# Patient Record
Sex: Male | Born: 1965 | Race: Black or African American | Hispanic: No | Marital: Married | Smoking: Never smoker
Health system: Southern US, Community
[De-identification: ages and names within clinical notes are randomized; demographics above are authoritative.]

## PROBLEM LIST (undated history)

## (undated) ENCOUNTER — Ambulatory Visit: Discharge status: Absent without leave | Payer: BC Managed Care – PPO

## (undated) DIAGNOSIS — I10 Essential (primary) hypertension: Secondary | ICD-10-CM

## (undated) DIAGNOSIS — M7541 Impingement syndrome of right shoulder: Secondary | ICD-10-CM

## (undated) DIAGNOSIS — J309 Allergic rhinitis, unspecified: Secondary | ICD-10-CM

## (undated) DIAGNOSIS — M1990 Unspecified osteoarthritis, unspecified site: Secondary | ICD-10-CM

## (undated) DIAGNOSIS — M25811 Other specified joint disorders, right shoulder: Secondary | ICD-10-CM

## (undated) DIAGNOSIS — Z9289 Personal history of other medical treatment: Secondary | ICD-10-CM

## (undated) DIAGNOSIS — R7301 Impaired fasting glucose: Secondary | ICD-10-CM

## (undated) DIAGNOSIS — G4733 Obstructive sleep apnea (adult) (pediatric): Secondary | ICD-10-CM

## (undated) DIAGNOSIS — E669 Obesity, unspecified: Secondary | ICD-10-CM

## (undated) DIAGNOSIS — Z973 Presence of spectacles and contact lenses: Secondary | ICD-10-CM

## (undated) DIAGNOSIS — E119 Type 2 diabetes mellitus without complications: Secondary | ICD-10-CM

## (undated) DIAGNOSIS — Z9989 Dependence on other enabling machines and devices: Secondary | ICD-10-CM

## (undated) DIAGNOSIS — G4730 Sleep apnea, unspecified: Secondary | ICD-10-CM

## (undated) HISTORY — DX: Obesity, unspecified: E66.9

## (undated) HISTORY — DX: Allergic rhinitis, unspecified: J30.9

## (undated) HISTORY — DX: Essential (primary) hypertension: I10

## (undated) HISTORY — DX: Impaired fasting glucose: R73.01

## (undated) HISTORY — PX: LAPAROSCOPIC CHOLECYSTECTOMY: SUR755

## (undated) HISTORY — PX: KNEE SURGERY: SHX244

## (undated) HISTORY — PX: CHOLECYSTECTOMY: SHX55

## (undated) HISTORY — DX: Sleep apnea, unspecified: G47.30

---

## 2001-10-09 ENCOUNTER — Ambulatory Visit (HOSPITAL_COMMUNITY): Admission: RE | Admit: 2001-10-09 | Discharge: 2001-10-09 | Payer: Self-pay | Admitting: Pulmonary Disease

## 2001-12-11 ENCOUNTER — Emergency Department (HOSPITAL_COMMUNITY): Admission: EM | Admit: 2001-12-11 | Discharge: 2001-12-11 | Payer: Self-pay | Admitting: Emergency Medicine

## 2002-12-14 ENCOUNTER — Encounter: Payer: Self-pay | Admitting: Orthopedic Surgery

## 2002-12-14 ENCOUNTER — Ambulatory Visit: Admission: RE | Admit: 2002-12-14 | Discharge: 2002-12-14 | Payer: Self-pay | Admitting: Orthopedic Surgery

## 2003-01-30 ENCOUNTER — Encounter (HOSPITAL_COMMUNITY): Admission: RE | Admit: 2003-01-30 | Discharge: 2003-03-01 | Payer: Self-pay

## 2003-03-04 ENCOUNTER — Ambulatory Visit (HOSPITAL_COMMUNITY): Admission: RE | Admit: 2003-03-04 | Discharge: 2003-03-04 | Payer: Self-pay | Admitting: Orthopedic Surgery

## 2003-03-05 ENCOUNTER — Encounter (HOSPITAL_COMMUNITY): Admission: RE | Admit: 2003-03-05 | Discharge: 2003-04-04 | Payer: Self-pay | Admitting: Internal Medicine

## 2003-03-12 ENCOUNTER — Encounter (HOSPITAL_COMMUNITY): Admission: RE | Admit: 2003-03-12 | Discharge: 2003-04-11 | Payer: Self-pay | Admitting: Orthopedic Surgery

## 2004-05-24 ENCOUNTER — Ambulatory Visit: Payer: Self-pay | Admitting: Pulmonary Disease

## 2004-05-24 ENCOUNTER — Ambulatory Visit: Admission: RE | Admit: 2004-05-24 | Discharge: 2004-05-24 | Payer: Self-pay | Admitting: Pulmonary Disease

## 2004-11-23 ENCOUNTER — Ambulatory Visit: Payer: Self-pay | Admitting: Orthopedic Surgery

## 2004-12-03 ENCOUNTER — Ambulatory Visit: Payer: Self-pay | Admitting: Orthopedic Surgery

## 2004-12-09 ENCOUNTER — Ambulatory Visit: Payer: Self-pay | Admitting: Orthopedic Surgery

## 2004-12-16 ENCOUNTER — Ambulatory Visit: Payer: Self-pay | Admitting: Orthopedic Surgery

## 2007-03-13 ENCOUNTER — Ambulatory Visit: Payer: Self-pay | Admitting: Orthopedic Surgery

## 2007-03-13 DIAGNOSIS — M25569 Pain in unspecified knee: Secondary | ICD-10-CM | POA: Insufficient documentation

## 2007-03-13 DIAGNOSIS — M1710 Unilateral primary osteoarthritis, unspecified knee: Secondary | ICD-10-CM

## 2007-03-13 DIAGNOSIS — IMO0002 Reserved for concepts with insufficient information to code with codable children: Secondary | ICD-10-CM | POA: Insufficient documentation

## 2007-04-06 ENCOUNTER — Ambulatory Visit: Payer: Self-pay | Admitting: Orthopedic Surgery

## 2007-04-06 DIAGNOSIS — M25529 Pain in unspecified elbow: Secondary | ICD-10-CM | POA: Insufficient documentation

## 2007-04-13 ENCOUNTER — Ambulatory Visit: Payer: Self-pay | Admitting: Orthopedic Surgery

## 2007-04-20 ENCOUNTER — Ambulatory Visit: Payer: Self-pay | Admitting: Orthopedic Surgery

## 2007-09-04 ENCOUNTER — Ambulatory Visit: Payer: Self-pay | Admitting: Orthopedic Surgery

## 2007-09-04 DIAGNOSIS — M23302 Other meniscus derangements, unspecified lateral meniscus, unspecified knee: Secondary | ICD-10-CM | POA: Insufficient documentation

## 2007-09-08 ENCOUNTER — Ambulatory Visit: Payer: Self-pay | Admitting: Orthopedic Surgery

## 2007-09-08 ENCOUNTER — Ambulatory Visit (HOSPITAL_COMMUNITY): Admission: RE | Admit: 2007-09-08 | Discharge: 2007-09-08 | Payer: Self-pay | Admitting: Orthopedic Surgery

## 2007-09-12 ENCOUNTER — Ambulatory Visit: Payer: Self-pay | Admitting: Orthopedic Surgery

## 2007-09-15 ENCOUNTER — Ambulatory Visit (HOSPITAL_COMMUNITY): Admission: RE | Admit: 2007-09-15 | Discharge: 2007-09-15 | Payer: Self-pay | Admitting: Family Medicine

## 2007-09-19 ENCOUNTER — Encounter (HOSPITAL_COMMUNITY): Admission: RE | Admit: 2007-09-19 | Discharge: 2007-10-19 | Payer: Self-pay | Admitting: Orthopedic Surgery

## 2007-09-19 ENCOUNTER — Encounter: Payer: Self-pay | Admitting: Orthopedic Surgery

## 2007-09-20 ENCOUNTER — Encounter (INDEPENDENT_AMBULATORY_CARE_PROVIDER_SITE_OTHER): Payer: Self-pay | Admitting: *Deleted

## 2007-09-20 ENCOUNTER — Ambulatory Visit (HOSPITAL_COMMUNITY): Admission: RE | Admit: 2007-09-20 | Discharge: 2007-09-20 | Payer: Self-pay | Admitting: *Deleted

## 2007-10-10 ENCOUNTER — Encounter: Payer: Self-pay | Admitting: Orthopedic Surgery

## 2007-10-11 ENCOUNTER — Ambulatory Visit: Payer: Self-pay | Admitting: Orthopedic Surgery

## 2007-10-23 ENCOUNTER — Ambulatory Visit: Payer: Self-pay | Admitting: Orthopedic Surgery

## 2007-10-27 ENCOUNTER — Encounter: Payer: Self-pay | Admitting: Orthopedic Surgery

## 2007-11-15 ENCOUNTER — Ambulatory Visit: Payer: Self-pay | Admitting: Orthopedic Surgery

## 2007-11-23 ENCOUNTER — Telehealth: Payer: Self-pay | Admitting: Orthopedic Surgery

## 2007-12-14 ENCOUNTER — Encounter: Payer: Self-pay | Admitting: Orthopedic Surgery

## 2008-05-20 ENCOUNTER — Ambulatory Visit: Payer: Self-pay | Admitting: Orthopedic Surgery

## 2008-06-20 ENCOUNTER — Encounter: Payer: Self-pay | Admitting: Orthopedic Surgery

## 2008-07-22 ENCOUNTER — Telehealth: Payer: Self-pay | Admitting: Orthopedic Surgery

## 2008-07-25 ENCOUNTER — Ambulatory Visit: Payer: Self-pay | Admitting: Orthopedic Surgery

## 2008-08-01 ENCOUNTER — Ambulatory Visit: Payer: Self-pay | Admitting: Orthopedic Surgery

## 2008-08-07 ENCOUNTER — Ambulatory Visit: Payer: Self-pay | Admitting: Orthopedic Surgery

## 2009-05-01 ENCOUNTER — Ambulatory Visit: Payer: Self-pay | Admitting: Orthopedic Surgery

## 2009-05-01 DIAGNOSIS — M549 Dorsalgia, unspecified: Secondary | ICD-10-CM | POA: Insufficient documentation

## 2009-05-14 ENCOUNTER — Telehealth: Payer: Self-pay | Admitting: Orthopedic Surgery

## 2009-05-14 ENCOUNTER — Ambulatory Visit: Payer: Self-pay | Admitting: Orthopedic Surgery

## 2009-05-15 ENCOUNTER — Encounter (HOSPITAL_COMMUNITY): Admission: RE | Admit: 2009-05-15 | Discharge: 2009-06-14 | Payer: Self-pay | Admitting: Orthopedic Surgery

## 2009-05-15 ENCOUNTER — Encounter: Payer: Self-pay | Admitting: Orthopedic Surgery

## 2009-06-17 ENCOUNTER — Encounter (HOSPITAL_COMMUNITY): Admission: RE | Admit: 2009-06-17 | Discharge: 2009-07-17 | Payer: Self-pay | Admitting: Orthopedic Surgery

## 2009-06-18 ENCOUNTER — Encounter: Payer: Self-pay | Admitting: Orthopedic Surgery

## 2009-06-25 ENCOUNTER — Ambulatory Visit: Payer: Self-pay | Admitting: Orthopedic Surgery

## 2009-07-04 ENCOUNTER — Encounter: Payer: Self-pay | Admitting: Orthopedic Surgery

## 2009-07-30 ENCOUNTER — Encounter: Payer: Self-pay | Admitting: Orthopedic Surgery

## 2010-04-08 ENCOUNTER — Ambulatory Visit: Payer: Self-pay | Admitting: Orthopedic Surgery

## 2010-06-04 NOTE — Assessment & Plan Note (Signed)
Summary: 2 WK RE-CK LT KNEE/BCBS/CAF   Visit Type:  Follow-up  CC:  back pain .  History of Present Illness: I saw Adam Hahn in the office today for a followup visit.  He is a 45 years old man with the complaint of:  back pain   DX: Left knee OA Low back pain.  Treatment: Synvisc x 3 over 3 different time periods.  MEDS:  Celebrex 200mg  as needed helps./ muscle relaxer using ice and heat   he complains of pain in the lower back and stiffness in his lower back with tightness in his hamstrings. As far as his LEFT knee goes, he has good and bad days, but no swelling today. He denies any numbness in his feet or legs, and no catching or locking has been noted in the back.  For the last 2, weeks. He's been stretching his back and taking Celebrex  physical exam of the LEFT knee shows that there is no swelling. Has full range of motion for him. His knee is stable. His strength is normal. Stability and alignment are normal.  Neurovascular exam is intact.  He does have some tenderness on each side of his back in the lower and middle lumbar segments.  Impression back pain without radicular symptoms.  Recommend physical therapy continue stretching. Okay to see the chiropractor or get a massage. Continue Celebrex. Return in about a month    For the last 2, weeks. He's been stretching Current Medications (verified): 1)  Celebrex 200 Mg  Caps (Celecoxib) .Marland Kitchen.. 1 By Mouth Two Times A Day  Allergies (verified): No Known Drug Allergies   Impression & Recommendations:  Problem # 1:  BACK PAIN (ICD-724.5) Assessment Unchanged  His updated medication list for this problem includes:    Celebrex 200 Mg Caps (Celecoxib) .Marland Kitchen... 1 by mouth two times a day  Orders: Est. Patient Level III (56387)  Patient Instructions: 1)  physical therapy:   2)  BACK PAIN  3)  return in 6 weeks  4)  Chiropractor is ok 5)  [Dr Dabbs in [place] or [facility] ask for Kat]

## 2010-06-04 NOTE — Miscellaneous (Signed)
Summary: PT order  PT order   Imported By: Cammie Sickle 07/18/2009 16:33:00  _____________________________________________________________________  External Attachment:    Type:   Image     Comment:   External Document

## 2010-06-04 NOTE — Medication Information (Signed)
Summary: Tax adviser   Imported By: Elvera Maria 08/01/2009 10:13:16  _____________________________________________________________________  External Attachment:    Type:   Image     Comment:   med necessity

## 2010-06-04 NOTE — Miscellaneous (Signed)
Summary: PT clinical evaluation  PT clinical evaluation   Imported By: Jacklynn Ganong 05/27/2009 10:57:23  _____________________________________________________________________  External Attachment:    Type:   Image     Comment:   External Document

## 2010-06-04 NOTE — Miscellaneous (Signed)
Summary: Rehab Report  Rehab Report   Imported By: Elvera Maria 06/24/2009 11:16:02  _____________________________________________________________________  External Attachment:    Type:   Image     Comment:   progress note pt

## 2010-06-04 NOTE — Progress Notes (Signed)
Summary: tightness in hamstrings  Phone Note Call from Patient   Summary of Call: Adam Hahn (07/08/2065) called back, said to tell you he is also having tightness in his hamstrings. His # [phone] Initial call taken by: Jacklynn Ganong,  May 14, 2009 2:12 PM

## 2010-06-04 NOTE — Miscellaneous (Signed)
Summary: PT progress notes  PT progress notes   Imported By: Jacklynn Ganong 07/08/2009 13:14:10  _____________________________________________________________________  External Attachment:    Type:   Image     Comment:   External Document

## 2010-06-04 NOTE — Letter (Signed)
Summary: Generic Letter  [facility]  [address] [place] [address]  [place], [place] [address]   Phone: [phone]  Fax: [phone]         06/25/2009   Adam Hahn [address] [place], [place]  [address]  Dear Mr. [name], Shomaker To Whom This May Concern:  Having a history of treatment with Mr. [name], and having reviewed his medical records to include service medical records, it is my opintion that "it is as least likely as not, that his current back condition stems from his knee condition based on altered gait, overprotection, and over compensation of that body part."   Sincerely,    Terrance Mass, MD

## 2010-06-04 NOTE — Assessment & Plan Note (Signed)
Summary: 6 wk RE-CK BACK,FOL'G PT/BCBS/CAF   Visit Type:  Follow-up  CC:  back pain fu.  History of Present Illness: I saw Adam Hahn in the office today for a followup visit.  He is a 45 years old man with the complaint of:  Low back pain.  Today is 6 week recheck after therapy.  Back is doing better.  ros; normal bodily functions bowel and bladder   Still taking Celebrex as needed.      Allergies: No Known Drug Allergies  Physical Exam  Additional Exam:  normal exam except for a small amount of midline loer back tenderness   Impression & Recommendations:  Problem # 1:  BACK PAIN (ICD-724.5) Assessment Improved  His updated medication list for this problem includes:    Celebrex 200 Mg Caps (Celecoxib) .Marland Kitchen... 1 by mouth two times a day    Robaxin 500 Mg Tabs (Methocarbamol) .Marland Kitchen... 1 by mouth q 6 as needed  Orders: Est. Patient Level III (86578)  Medications Added to Medication List This Visit: 1)  Robaxin 500 Mg Tabs (Methocarbamol) .Marland Kitchen.. 1 by mouth q 6 as needed  Patient Instructions: 1)  Finish treatments  2)  take robaxin as needed  3)  f/u as needed knee or back  Prescriptions: ROBAXIN 500 MG TABS (METHOCARBAMOL) 1 by mouth q 6 as needed  #30 x 2   Entered and Authorized by:   Fuller Canada MD   Signed by:   Fuller Canada MD on 06/25/2009   Method used:   Print then Give to Patient   RxID:   [id number]

## 2010-09-15 NOTE — Op Note (Signed)
NAME:  Adam Hahn, Adam Hahn NO.:  [id number]   MEDICAL RECORD NO.:  [id number]          PATIENT TYPE:  AMB   LOCATION:  DAY                           FACILITY:  [facility]   PHYSICIAN:  Vickki Hearing, M.D.DATE OF BIRTH:  Mar 21, 1966   DATE OF PROCEDURE:  09/08/2007  DATE OF DISCHARGE:                               OPERATIVE REPORT   HISTORY:  Adam Hahn is a 45 year old male status post 4 left knee  arthroscopies with no major medical problems who presents with a feeling  of a loose body in his knee.  He was treated with Synvisc injections,  rest, ice, Tylenol Arthritis, activity modification, but continues to  have a grinding sensation under his knee cap on the lateral side with  feeling that there is something floating or loose in the knee joint.  Over the last 2 weeks, he had more pain and swelling and presents for  arthroscopic evaluation of the left knee.   PREOPERATIVE DIAGNOSIS:  Left knee pain with loose body.   POSTOPERATIVE DIAGNOSIS:  Arthritis left knee.   PROCEDURES:  1. Arthroscopy left knee.  2. Debridement  lateral meniscus, lateral femoral condyle, and lateral      tibial plateau.  3. Microfracture of the superolateral trochlea.   SURGEON:  Fuller Canada, MD.   ASSISTANTS:  There were no assistants.   ANESTHESIA:  General LMA.   FINDINGS:  1. There was normal medial compartment.  2. Attenuated but intact ACL.  3. Fraying and tearing of the free edge of the lateral meniscus.  4. Grade 3 changes of the lateral femoral condyle.  5. Grade 2 changes of the lateral tibial plateau, grade 4 lesion in      the superolateral trochlea which was the area of the patient's      pain.  6. Grade 3 lesions lateral facet of the patella.   SPECIMENS:  No specimens.   BLOOD LOSS:  Minimal.   COMPLICATIONS:  None.   COUNTS:  Reported as correct.   The patient taken to the PACU in good condition.   DETAILS OF PROCEDURE:  Adam Hahn was identified in the  preop area.  His  left knee was marked for surgery and then countersigned.  He was given  his preoperative antibiotic and taken to surgery suite where he had  general anesthetic with LMA.  Time-out procedure was completed after  prep and drape.   Portal sites were injected.  The lateral portal was established first.  A scope was introduced through the lateral compartment.  Diagnostic  arthroscopy revealed the findings as stated.  We addressed the lateral  meniscus first.  We debrided the free edge tearing.  We debrided the  tibial plateau with a straight shaver, lateral femoral condyle was  debrided with chondroplasty as well.   We then went into the suprapatellar pouch where was a grade 4 lesion of  the superolateral trochlea with grade 3 lesion of the lateral facet of  the patella.  After debriding this region, we did three microfractures  into this area to stimulate healing.   The knee was then irrigated  and 15 mL of Marcaine was injected into it.  We closed with Steri-Strips, applied a sterile dressing and a cryo cuff.   POSTOP PLAN:  The trochlear microfracture will not affect his  weightbearing status as it is not in a weightbearing region of the  femoral condyle.  He can start immediate range of motion, weightbearing,  and progress as normal.  The only limitation should be stair climbing  other than routine.      Vickki Hearing, M.D.  Electronically Signed     SEH/MEDQ  D:  09/08/2007  T:  09/09/2007  Job:  [id number]

## 2010-09-15 NOTE — Op Note (Signed)
NAME:  MARTINO, TOMPSON NO.:  [id number]   MEDICAL RECORD NO.:  [id number]          PATIENT TYPE:  REC   LOCATION:  REH                           FACILITY:  [facility]   PHYSICIAN:  Alfonse Ras, MD   DATE OF BIRTH:  [birth year]   DATE OF PROCEDURE:  DATE OF DISCHARGE:                               OPERATIVE REPORT   PREOPERATIVE DIAGNOSIS:  Symptomatic cholelithiasis.   POSTOPERATIVE DIAGNOSES:  1. Symptomatic cholelithiasis.  2. Acute cholecystitis.   PROCEDURE:  Laparoscopic cholecystectomy with intraoperative  cholangiogram.   SURGEON:  Alfonse Ras, MD   ASSISTANT:  Amber L. Freida Busman, MD   ANESTHESIA:  General.   DESCRIPTION:  The patient was taken to the operating room, placed in a  supine position.  After adequate general anesthesia was induced using  endotracheal tube, the abdomen was prepped and draped in a normal  sterile fashion.  Using a transverse infraumbilical incision, I  dissected down to the fascia.  This was opened vertically.  Peritoneum  was entered and 0 Vicryl pursestring suture was placed on the fascial  defect.  Hasson trocar was placed in the abdomen and pneumoperitoneum  was obtained.  Under direct vision, an 11 mm trocar was placed in the  subxiphoid region, two 5 mm ports were placed in the right abdomen and  gallbladder was identified and retracted cephalad.  There was a number  of dense and filmy adhesions to the entire gallbladder which were  tediously taken down until the infundibulum could easily be identified  and grasped laterally.  Dissection revealed a critical view of the  cystic duct which was clipped up on the gallbladder.  Ductotomy was made  and Reddick cholangiocatheter was introduced through the ductotomy.  Cholangiogram was performed which showed normal filling of the common  bile duct, right and left hepatic ducts and free flow into the duodenum  with no filling defects.  Cystic duct was then triply clipped  and  divided.  Cystic artery was dissected in a similar fashion, triply  clipped and divided.  The gallbladder was taken off the gallbladder bed  using Bovie electrocautery and placed in an Endocatch bag and removed  through the umbilical port.  The right upper quadrant was copiously  irrigated.  Adequate hemostasis was ensured.  Pneumoperitoneum was  released.  The infraumbilical fascial defect was closed with a 0 Vicryl  pursestring suture.  Skin incisions were closed with subcuticular 4-0  Monocryl.  Steri-Strips and sterile dressings were applied.  The patient  tolerated the procedure well and went to PACU in good condition.      Alfonse Ras, MD  Electronically Signed    KRE/MEDQ  D:  09/20/2007  T:  09/21/2007  Job:  [id number]

## 2010-09-15 NOTE — H&P (Signed)
NAME:  SAUD, BAIL NO.:  [id number]   MEDICAL RECORD NO.:  [id number]          PATIENT TYPE:  AMB   LOCATION:  DAY                           FACILITY:  [facility]   PHYSICIAN:  Vickki Hearing, M.D.DATE OF BIRTH:  22-May-1965   DATE OF ADMISSION:  DATE OF DISCHARGE:  LH                              HISTORY & PHYSICAL   CHIEF COMPLAINT:  Left knee pain.   HISTORY:  Adam Hahn is a 45 year old male status post 4 left knee  arthroscopies with no major medical problems, no allergies, no medicines  of any significance who presents with continued knee pain and feeling of  loose body in the knee and grinding sensation in the kneecap after  treatment with Synvisc injections for the second time, use of ice,  Tylenol arthritis, and activity modification.  Approximately November  2008, he started having symptoms again, had his Synvisc injections  repeated and completed on April 20, 2007, did well until  approximately a week or so ago, when he started having more pain and  swelling.   Past family, and social history, and review of systems as stated.   ALLERGIES:  No known drug allergies.   PHYSICAL EXAMINATION:  GENERAL:  This is a well-developed, well-  nourished large male with no deformities.  Normal grooming.  CARDIOVASCULAR:  Normal pulse and perfusion with good color.  EXTREMITIES:  No tenderness or swelling in the limbs.  LYMPH:  Lymph nodes are benign.  PSYCH:  He is awake, alert, and oriented x3.  NEUROLOGIC:  Normal coordination, reflexes, and sensation.  SKIN:  Intact with no rashes.  MUSCULOSKELETAL:  Normal evaluation of the upper extremities with no  clubbing, cyanosis, edema, contracture, subluxation, atrophy, or tremor.  Right knee examination is benign today.  Left knee exam reveals the  following; there is no swelling.  There is some discomfort to palpation  especially on the lateral side at the patellofemoral articulation and on  the lateral  femoral condyle.  He has a mild effusion, flexion to 120  degrees, and extension is full.   Radiographs were repeated.  I did not see any loose bodies.   IMPRESSION:  Impression, however, is loose body.   PLAN:  Plan is for arthroscopy of the left knee removal of the loose  body.  Debridement of degenerative articular cartilage.   The patient does not want to have cartilage transplant, which was  discussed.  He does not want microfracture, which was also discussed.      Vickki Hearing, M.D.  Electronically Signed     SEH/MEDQ  D:  09/07/2007  T:  09/08/2007  Job:  [id number]   cc:   [facility]  Fax: [phone]

## 2010-09-18 NOTE — Procedures (Signed)
[facility]  Patient:    NETHAN, CAUDILLO Visit Number: [id number] MRN: [id number]          Service Type: Attending:  Kari Baars, M.D. Dictated by:   Kari Baars, M.D. Proc. Date: 10/09/01                                Stress Test  Mr. Wetherington is undergoing graded exercise testing because of a history of chest discomfort.  He has no contraindications to graded exercise testing.  Mr. Keesling exercised for seven minutes and 27 seconds on the Bruce protocol reaching and sustaining 10.1 METS.  His maximum recorded heart rate was 174 which is 93% of his age predicted maximal heart rate.  Blood pressure response to exercise was normal.  He stopped exercise due to fatigue and shortness of breath, but had no chest pain.  There were no electrocardiographic changes suggestive of inducible ischemia.  IMPRESSION: 1. Good exercise tolerance. 2. No evidence of inducible ischemia. 3. Normal blood pressure response to exercise. 4. Shortness of breath and fatigue, but no other symptoms with exercise. Dictated by:   Kari Baars, M.D. Attending:  Kari Baars, M.D. DD:  10/09/01 TD:  10/10/01 Job: [id number]

## 2010-09-18 NOTE — Procedures (Signed)
NAME:  Adam Hahn, Adam Hahn NO.:  [id number]   MEDICAL RECORD NO.:  [id number]          PATIENT TYPE:  OUT   LOCATION:  SLEEP LAB                     FACILITY:  [facility]   PHYSICIAN:  Marcelyn Bruins, M.D. [facility] DATE OF BIRTH:  30-Jul-1965   DATE OF STUDY:  05/24/2004                              NOCTURNAL POLYSOMNOGRAM   REFERRING PHYSICIAN:  Kari Baars, MD   INDICATION FOR STUDY:  Hypersomnia with sleep apnea.   SLEEP ARCHITECTURE:  The patient had a total sleep time of 425 minutes with  adequate REM and decreased slow wave sleep. Sleep onset was normal as was  REM onset.   IMPRESSION:  1.  Split-night study reveals severe obstructive sleep apnea/hypopnea      syndrome with 125 obstructive events noted in the first 169 minutes of      sleep. This gave the patient a respiratory disturbance index of 44      events per hour and O2 desaturation as low as 84%. Events were not      positional. There was moderate to loud snoring noted during the first      half of the study. By protocol, the patient was placed on a medium      Aclaim nasal mask and C-PAP titration was initiated. At a level of 6,      there was control of snoring and moderate control of the patient's      obstructive sleep apnea. However, there was some degree of breakthrough      during the final REM periods. I would recommend at this time a final C-      PAP pressure of 8, especially given the patient's severity of sleep      apnea.  2.  Moderate to loud snoring noted throughout the study.  3.  No clinically significant cardiac arrhythmia.      KC/MEDQ  D:  06/05/2004 16:29:47  T:  06/06/2004 12:06:53  Job:  [id number]

## 2011-01-27 LAB — CBC
HCT: 48.3
Hemoglobin: 16.4
MCHC: 34
MCV: 86
Platelets: 294
RBC: 5.61
RDW: 13
WBC: 8.3

## 2011-01-27 LAB — COMPREHENSIVE METABOLIC PANEL
ALT: 38
AST: 31
Albumin: 4
Alkaline Phosphatase: 47
BUN: 13
CO2: 28
Calcium: 9.9
Chloride: 101
Creatinine, Ser: 1.2
GFR calc Af Amer: >60
GFR calc non Af Amer: >60
Glucose, Bld: 99
Potassium: 5
Sodium: 138
Total Bilirubin: 0.8
Total Protein: 7

## 2011-01-27 LAB — DIFFERENTIAL
Basophils Absolute: 0.1
Basophils Relative: 1
Eosinophils Absolute: 0.9 — ABNORMAL HIGH
Eosinophils Relative: 11 — ABNORMAL HIGH
Lymphocytes Relative: 36
Lymphs Abs: 3
Monocytes Absolute: 0.5
Monocytes Relative: 7
Neutro Abs: 3.7
Neutrophils Relative %: 45

## 2011-06-03 ENCOUNTER — Ambulatory Visit: Payer: Self-pay | Admitting: Orthopedic Surgery

## 2011-06-03 ENCOUNTER — Ambulatory Visit (INDEPENDENT_AMBULATORY_CARE_PROVIDER_SITE_OTHER): Payer: BC Managed Care – PPO | Admitting: Orthopedic Surgery

## 2011-06-03 ENCOUNTER — Encounter: Payer: Self-pay | Admitting: Orthopedic Surgery

## 2011-06-03 VITALS — Ht 73.0 in | Wt 312.0 lb

## 2011-06-03 DIAGNOSIS — Z9889 Other specified postprocedural states: Secondary | ICD-10-CM | POA: Insufficient documentation

## 2011-06-03 DIAGNOSIS — IMO0002 Reserved for concepts with insufficient information to code with codable children: Secondary | ICD-10-CM

## 2011-06-03 DIAGNOSIS — M1712 Unilateral primary osteoarthritis, left knee: Secondary | ICD-10-CM

## 2011-06-03 DIAGNOSIS — M1710 Unilateral primary osteoarthritis, unspecified knee: Secondary | ICD-10-CM

## 2011-06-03 HISTORY — DX: Other specified postprocedural states: Z98.890

## 2011-06-03 NOTE — Patient Instructions (Signed)
Activity as tolerated

## 2011-06-03 NOTE — Progress Notes (Signed)
Patient ID: Adam Hahn, male   DOB: 1965/06/04, 46 y.o.   MRN: [id number]  LEFT knee.  Followup visit.  Status post arthroscopy, LEFT knee back in 2009.  Operative findings were as follows: Medial compartment was normal, and the ACL was attenuated, but intact, lateral meniscus was torn, and grade 3 changes of lateral femoral condyle, grade 2 changes lateral tibial plateau, grade 4 changes of the superolateral trochlea, grade 3 changes of the lateral patellofemoral facet.    Today's examination knee flexion 112. There is crepitance in the patellofemoral joint. There is also painful. Apprehension with lateral patellofemoral distress. There is mild laxity in anterior drawer test. Collateral ligaments are stable. Posterior cruciate ligament is stable. No joint effusion is seen. There is tenderness in the medial and  lateral compartments.  The [employer] would like a report regarding the status of the patient's knee.  According to the forms marked 4.71a-2 following report is rendered: (after speaking with Ms Dorris Carnes)  5003 , arthritis, degenerative (hypertrophic or osteoarthritis) rating is a 10 based on categories not listed as he has x-ray evidence of one major joint with incapacitating exacerbation. This meets criteria of "evidence of painful range of motion."  5256 knee, ankylosis of zero (ROM =0-110 measured by goniometer)  5257 knee other impairment of recurrent subluxation, or lateral instability, slight-10. (patellofemoral joint subluxation)  5258 cartilage semilunar dislocated with frequent episodes of locking pain and effusion into the joint:  0, ( lateral meniscus has been removed ).  5259 cartilage, semilunar, removal of, symptomatic: 10, secondary to lateral meniscal resection.  Please note it should be recognized that the patient has intermittent effusions. Has been in to the office for injections of cortisone and aspirations depending on his symptoms, which  are activity related. His symptoms are exacerbated by activity.  Diagnosis osteoarthritis, LEFT knee.

## 2011-06-23 ENCOUNTER — Ambulatory Visit: Payer: BC Managed Care – PPO | Admitting: Orthopedic Surgery

## 2012-09-08 ENCOUNTER — Encounter: Payer: Self-pay | Admitting: *Deleted

## 2012-09-11 ENCOUNTER — Encounter: Payer: Self-pay | Admitting: Family Medicine

## 2012-09-11 ENCOUNTER — Ambulatory Visit (INDEPENDENT_AMBULATORY_CARE_PROVIDER_SITE_OTHER): Payer: BC Managed Care – PPO | Admitting: Family Medicine

## 2012-09-11 VITALS — BP 128/80 | HR 70 | Ht 71.5 in | Wt 300.0 lb

## 2012-09-11 DIAGNOSIS — Z79899 Other long term (current) drug therapy: Secondary | ICD-10-CM

## 2012-09-11 DIAGNOSIS — G4733 Obstructive sleep apnea (adult) (pediatric): Secondary | ICD-10-CM

## 2012-09-11 DIAGNOSIS — R7301 Impaired fasting glucose: Secondary | ICD-10-CM | POA: Insufficient documentation

## 2012-09-11 DIAGNOSIS — Z125 Encounter for screening for malignant neoplasm of prostate: Secondary | ICD-10-CM

## 2012-09-11 DIAGNOSIS — I10 Essential (primary) hypertension: Secondary | ICD-10-CM | POA: Insufficient documentation

## 2012-09-11 LAB — LIPID PANEL
Cholesterol: 124 mg/dL (ref 0–200)
HDL: 42 mg/dL (ref >39)
LDL Cholesterol: 61 mg/dL (ref 0–99)
Total CHOL/HDL Ratio: 3 Ratio
Triglycerides: 107 mg/dL (ref <150)
VLDL: 21 mg/dL (ref 0–40)

## 2012-09-11 LAB — BASIC METABOLIC PANEL
BUN: 14 mg/dL (ref 6–23)
CO2: 27 mEq/L (ref 19–32)
Calcium: 9.7 mg/dL (ref 8.4–10.5)
Chloride: 102 mEq/L (ref 96–112)
Creat: 1.2 mg/dL (ref 0.50–1.35)
Glucose, Bld: 94 mg/dL (ref 70–99)
Potassium: 4.4 mEq/L (ref 3.5–5.3)
Sodium: 139 mEq/L (ref 135–145)

## 2012-09-11 LAB — HEPATIC FUNCTION PANEL
ALT: 30 U/L (ref 0–53)
AST: 23 U/L (ref 0–37)
Albumin: 4.2 g/dL (ref 3.5–5.2)
Alkaline Phosphatase: 59 U/L (ref 39–117)
Bilirubin, Direct: 0.1 mg/dL (ref 0.0–0.3)
Indirect Bilirubin: 0.4 mg/dL (ref 0.0–0.9)
Total Bilirubin: 0.5 mg/dL (ref 0.3–1.2)
Total Protein: 7.4 g/dL (ref 6.0–8.3)

## 2012-09-11 LAB — PSA: PSA: 0.44 ng/mL (ref <=4.00)

## 2012-09-11 NOTE — Progress Notes (Signed)
  Subjective:    Patient ID: Adam Hahn, male    DOB: 29-Jan-1966, 47 y.o.   MRN: [id number]  Hypertension This is a chronic problem. The current episode started more than 1 month ago. The problem is unchanged. The problem is controlled. Pertinent negatives include no chest pain, malaise/fatigue or neck pain. There are no associated agents to hypertension. There are no known risk factors for coronary artery disease. Past treatments include ACE inhibitors (has lost over twenty pounds). The current treatment provides mild improvement. There are no compliance problems.  There is no history of angina.    Uses cpap every night definitely helps. On it for seven years. Works with [employer]. Needs refill machine. We talked about options. Patient would like to try a auto titration machine.    Patient realizes he has a problem with sugar. He has tried to cut down to sugar intake. Walking some with this.  He is working hard on his weight. He has lost 20 some pounds. Hopes to lose more. Review of Systems  Constitutional: Negative for malaise/fatigue.  HENT: Negative for neck pain.   Cardiovascular: Negative for chest pain.       Objective:   Physical Exam Aler, nad. Obesity present. Vitals reviewed. Lungs clear. Heart regular rate and rhythm. Abdomen large. Ankles without edema. Pharynx increased soft tissue.       Assessment & Plan:  Impression #1 hypertension good control. #2 sleep apnea ongoing need for CPAP machine. Patient would like to attempt a auto titration prescription. #3 impaired fasting glucose status uncertain. Plan diet exercise discussed. Prescriptions given. Appropriate blood work. Further recommendations based results. Check every 6 months. WSL

## 2012-09-11 NOTE — Patient Instructions (Signed)
Keep working on diet and exercise

## 2012-09-12 ENCOUNTER — Encounter: Payer: Self-pay | Admitting: Family Medicine

## 2012-09-21 ENCOUNTER — Telehealth: Payer: Self-pay | Admitting: Family Medicine

## 2012-09-21 NOTE — Telephone Encounter (Signed)
Faxed to [facility] attention Tammy, last office notes for encounter date 09/11/2012. Fax number [phone].Marland Kitchen Completed - kal

## 2012-09-21 NOTE — Telephone Encounter (Signed)
Completed - kal

## 2012-11-09 ENCOUNTER — Encounter: Payer: Self-pay | Admitting: Orthopedic Surgery

## 2012-11-09 ENCOUNTER — Ambulatory Visit (INDEPENDENT_AMBULATORY_CARE_PROVIDER_SITE_OTHER): Payer: BC Managed Care – PPO | Admitting: Orthopedic Surgery

## 2012-11-09 ENCOUNTER — Ambulatory Visit (INDEPENDENT_AMBULATORY_CARE_PROVIDER_SITE_OTHER): Payer: BC Managed Care – PPO

## 2012-11-09 VITALS — BP 129/72 | Ht 73.0 in | Wt 305.0 lb

## 2012-11-09 DIAGNOSIS — M1710 Unilateral primary osteoarthritis, unspecified knee: Secondary | ICD-10-CM

## 2012-11-09 DIAGNOSIS — M25569 Pain in unspecified knee: Secondary | ICD-10-CM

## 2012-11-09 DIAGNOSIS — IMO0002 Reserved for concepts with insufficient information to code with codable children: Secondary | ICD-10-CM

## 2012-11-09 DIAGNOSIS — M1712 Unilateral primary osteoarthritis, left knee: Secondary | ICD-10-CM

## 2012-11-09 DIAGNOSIS — M25562 Pain in left knee: Secondary | ICD-10-CM

## 2012-11-09 NOTE — Progress Notes (Signed)
Patient ID: Adam Hahn, male   DOB: [birth year], 47 y.o.   MRN: [id number] Chief Complaint  Patient presents with  . Follow-up    Recheck left knee incraesed pain and swelling    History Adam Hahn has a long history of problems with his left knee has had surgery and him course of injections of steroids as well as Synvisc presents with acute pain and swelling of the left knee which he has treated with ice rest ibuprofen and has done a good job with swelling. He still having some mechanical symptoms of popping catching no locking and a slight decrease in his movement.  There is no evidence of rash over the skin numbness or tingling in the leg unsteady gait or vascular symptoms  Physical Exam(12)  Vital signs:   1.GENERAL: normal development   2. CDV: pulses are normal   3. Skin: normal  4. Lymph: nodes were not palpable/normal  5/6. Psychiatric: awake, alert and oriented, mood and affect normal   7. Neuro: normal sensation  8.   MSK  Gait: He is able walk unsupported with no significant limp 9.   Inspection crepitance and range of motion deficits are noted his flexion is only 120 there is medial and lateral joint line tenderness  fortunately motor exam remains intact, the knee remains stable  Imaging the x-ray shows diffuse arthritis  Assessment:  Encounter Diagnoses  Name Primary?  . Knee pain, left Yes  . Osteoarthritis of left knee        Plan: Inject left knee order Orthovisc  Knee  Injection Procedure Note  Pre-operative Diagnosis: left knee oa  Post-operative Diagnosis: same  Indications: pain  Anesthesia: ethyl chloride   Procedure Details   Verbal consent was obtained for the procedure. Time out was completed.The joint was prepped with alcohol, followed by  Ethyl chloride spray and A 20 gauge needle was inserted into the knee via lateral approach; 4ml 1% lidocaine and 1 ml of depomedrol  was then injected into the joint . The needle was removed and  the area cleansed and dressed.  Complications:  None; patient tolerated the procedure well.

## 2012-11-09 NOTE — Patient Instructions (Addendum)
You have received a steroid shot. 15% of patients experience increased pain at the injection site with in the next 24 hours. This is best treated with ice and tylenol extra strength 2 tabs every 8 hours. If you are still having pain please call the office.   Orthovisc order in progress

## 2012-11-28 ENCOUNTER — Ambulatory Visit (INDEPENDENT_AMBULATORY_CARE_PROVIDER_SITE_OTHER): Payer: BC Managed Care – PPO | Admitting: Orthopedic Surgery

## 2012-11-28 ENCOUNTER — Encounter: Payer: Self-pay | Admitting: Orthopedic Surgery

## 2012-11-28 VITALS — BP 120/76 | Ht 73.0 in | Wt 305.0 lb

## 2012-11-28 DIAGNOSIS — M1710 Unilateral primary osteoarthritis, unspecified knee: Secondary | ICD-10-CM

## 2012-11-28 DIAGNOSIS — IMO0002 Reserved for concepts with insufficient information to code with codable children: Secondary | ICD-10-CM

## 2012-11-28 DIAGNOSIS — M1712 Unilateral primary osteoarthritis, left knee: Secondary | ICD-10-CM

## 2012-11-28 MED ORDER — CELECOXIB 200 MG PO CAPS: 200.0000 mg | ORAL_CAPSULE | Freq: Every day | ORAL | Status: DC

## 2012-11-28 NOTE — Patient Instructions (Addendum)
You have received a steroid shot. 15% of patients experience increased pain at the injection site with in the next 24 hours. This is best treated with ice and tylenol extra strength 2 tabs every 8 hours. If you are still having pain please call the office.    

## 2012-11-28 NOTE — Progress Notes (Signed)
Patient ID: Adam Hahn, male   DOB: 04-Oct-1965, 48 y.o.   MRN: [id number] Chief Complaint  Patient presents with  . Follow-up    Othovisc Injection #1 of 3 Left knee    ORTHOVISC INJECTION PROCEDURE   DX OA left KNEE   TIME OUT   VERBAL CONSENT   ALCOHOL PREP   ETHYL CHLORIDE ANESTHESIA   LATERAL APPROACH : INJECTED 1 VIAL OF ORTHOVISC   NO COMPLICATIONS

## 2012-12-05 ENCOUNTER — Encounter: Payer: Self-pay | Admitting: Orthopedic Surgery

## 2012-12-05 ENCOUNTER — Ambulatory Visit (INDEPENDENT_AMBULATORY_CARE_PROVIDER_SITE_OTHER): Payer: BC Managed Care – PPO | Admitting: Orthopedic Surgery

## 2012-12-05 VITALS — BP 124/82 | Ht 73.0 in | Wt 305.0 lb

## 2012-12-05 DIAGNOSIS — M1710 Unilateral primary osteoarthritis, unspecified knee: Secondary | ICD-10-CM

## 2012-12-05 DIAGNOSIS — M1712 Unilateral primary osteoarthritis, left knee: Secondary | ICD-10-CM

## 2012-12-05 DIAGNOSIS — IMO0002 Reserved for concepts with insufficient information to code with codable children: Secondary | ICD-10-CM

## 2012-12-05 NOTE — Progress Notes (Signed)
Patient ID: Adam Hahn, male   DOB: 1966-04-04, 47 y.o.   MRN: [id number] Chief Complaint  Patient presents with  . Follow-up    Orthovisc Injection # 2 of 3  Left Knee    Orthovisc injection  Preop diagnosis osteoarthritis left knee  Postoperative diagnosis same  Procedure Orthovisc injection  Verbal consent was obtained. Timeout was completed.  The knee was prepped with alcohol and ethyl chloride. A 20-gauge needle was used to inject 1 bile of Orthovisc into the joint via lateral approach  There were no complications the wound was sterilely dressed.

## 2012-12-12 ENCOUNTER — Encounter: Payer: Self-pay | Admitting: Orthopedic Surgery

## 2012-12-12 ENCOUNTER — Ambulatory Visit (INDEPENDENT_AMBULATORY_CARE_PROVIDER_SITE_OTHER): Payer: BC Managed Care – PPO | Admitting: Orthopedic Surgery

## 2012-12-12 VITALS — BP 110/80 | Ht 73.0 in | Wt 305.0 lb

## 2012-12-12 DIAGNOSIS — IMO0002 Reserved for concepts with insufficient information to code with codable children: Secondary | ICD-10-CM

## 2012-12-12 DIAGNOSIS — M1712 Unilateral primary osteoarthritis, left knee: Secondary | ICD-10-CM

## 2012-12-12 DIAGNOSIS — M1710 Unilateral primary osteoarthritis, unspecified knee: Secondary | ICD-10-CM

## 2012-12-12 NOTE — Patient Instructions (Addendum)
Ice as needed s/p injection

## 2012-12-12 NOTE — Progress Notes (Signed)
Patient ID: Adam Hahn, male   DOB: 1965/05/22, 47 y.o.   MRN: [id number] Chief Complaint  Patient presents with  . Follow-up    # 3 of 3 orthovisc injection Left knee    BP 110/80  Ht 6\' 1"  (1.854 m)  Wt 305 lb (138.347 kg)  BMI 40.25 kg/m2  Orthovisc injection  Preop diagnosis osteoarthritis left knee  Postoperative diagnosis same  Procedure Orthovisc injection  Verbal consent was obtained. Timeout was completed.  The knee was prepped with alcohol and ethyl chloride. A 20-gauge needle was used to inject 1 bile of Orthovisc into the joint via lateral approach  There were no complications the wound was sterilely dressed.

## 2013-01-23 ENCOUNTER — Ambulatory Visit: Payer: BC Managed Care – PPO | Admitting: Orthopedic Surgery

## 2013-04-06 ENCOUNTER — Telehealth: Payer: Self-pay | Admitting: *Deleted

## 2013-04-06 DIAGNOSIS — I10 Essential (primary) hypertension: Secondary | ICD-10-CM

## 2013-04-06 DIAGNOSIS — Z125 Encounter for screening for malignant neoplasm of prostate: Secondary | ICD-10-CM

## 2013-04-06 DIAGNOSIS — Z79899 Other long term (current) drug therapy: Secondary | ICD-10-CM

## 2013-04-06 NOTE — Telephone Encounter (Signed)
Does pt need bloodwork for physical in January. He had lipid, liver, bmp and psa on 09/11/12.

## 2013-04-06 NOTE — Telephone Encounter (Signed)
bloodwork orders done. Pt's wife notified.

## 2013-04-06 NOTE — Telephone Encounter (Signed)
We can repeat all but somewhat early

## 2013-04-12 LAB — HEPATIC FUNCTION PANEL
ALT: 33 U/L (ref 0–53)
AST: 23 U/L (ref 0–37)
Albumin: 4.5 g/dL (ref 3.5–5.2)
Alkaline Phosphatase: 62 U/L (ref 39–117)
Bilirubin, Direct: 0.1 mg/dL (ref 0.0–0.3)
Indirect Bilirubin: 0.4 mg/dL (ref 0.0–0.9)
Total Bilirubin: 0.5 mg/dL (ref 0.3–1.2)
Total Protein: 7 g/dL (ref 6.0–8.3)

## 2013-04-12 LAB — LIPID PANEL
Cholesterol: 146 mg/dL (ref 0–200)
HDL: 48 mg/dL (ref >39)
LDL Cholesterol: 81 mg/dL (ref 0–99)
Total CHOL/HDL Ratio: 3 Ratio
Triglycerides: 86 mg/dL (ref <150)
VLDL: 17 mg/dL (ref 0–40)

## 2013-04-12 LAB — BASIC METABOLIC PANEL
BUN: 16 mg/dL (ref 6–23)
CO2: 29 mEq/L (ref 19–32)
Calcium: 9.9 mg/dL (ref 8.4–10.5)
Chloride: 100 mEq/L (ref 96–112)
Creat: 1.25 mg/dL (ref 0.50–1.35)
Glucose, Bld: 111 mg/dL — ABNORMAL HIGH (ref 70–99)
Potassium: 4.1 mEq/L (ref 3.5–5.3)
Sodium: 136 mEq/L (ref 135–145)

## 2013-04-13 LAB — PSA: PSA: 0.27 ng/mL (ref <=4.00)

## 2013-05-09 ENCOUNTER — Ambulatory Visit (INDEPENDENT_AMBULATORY_CARE_PROVIDER_SITE_OTHER): Payer: BC Managed Care – PPO | Admitting: Family Medicine

## 2013-05-09 ENCOUNTER — Encounter: Payer: Self-pay | Admitting: Family Medicine

## 2013-05-09 VITALS — BP 128/88 | Ht 72.0 in | Wt 320.0 lb

## 2013-05-09 DIAGNOSIS — I10 Essential (primary) hypertension: Secondary | ICD-10-CM

## 2013-05-09 MED ORDER — LORCASERIN HCL 10 MG PO TABS: ORAL_TABLET | ORAL | Status: DC

## 2013-05-09 MED ORDER — AZITHROMYCIN 250 MG PO TABS: ORAL_TABLET | ORAL | Status: DC

## 2013-05-09 NOTE — Progress Notes (Signed)
Subjective:    Patient ID: Adam Hahn, male    DOB: June 03, 1965, 48 y.o.   MRN: [id number]  HPI Patient is here today for his annual physical exam. He states he has no concerns at this time and is doing very well.   States watching diet very closely except during the holidays  Complains of ongoing knee pain  No colon ca /   CPAP still helping a lot  Physically pushes it hard at work , sports injury early on Dr Aline Brochure nowfollowing knee Results for orders placed in visit on 04/06/13  LIPID PANEL      Result Value Range   Cholesterol 146  0 - 200 mg/dL   Triglycerides 86  <150 mg/dL   HDL 48  >39 mg/dL   Total CHOL/HDL Ratio 3.0     VLDL 17  0 - 40 mg/dL   LDL Cholesterol 81  0 - 99 mg/dL  HEPATIC FUNCTION PANEL      Result Value Range   Total Bilirubin 0.5  0.3 - 1.2 mg/dL   Bilirubin, Direct 0.1  0.0 - 0.3 mg/dL   Indirect Bilirubin 0.4  0.0 - 0.9 mg/dL   Alkaline Phosphatase 62  39 - 117 U/L   AST 23  0 - 37 U/L   ALT 33  0 - 53 U/L   Total Protein 7.0  6.0 - 8.3 g/dL   Albumin 4.5  3.5 - 5.2 g/dL  BASIC METABOLIC PANEL      Result Value Range   Sodium 136  135 - 145 mEq/L   Potassium 4.1  3.5 - 5.3 mEq/L   Chloride 100  96 - 112 mEq/L   CO2 29  19 - 32 mEq/L   Glucose, Bld 111 (*) 70 - 99 mg/dL   BUN 16  6 - 23 mg/dL   Creat 1.25  0.50 - 1.35 mg/dL   Calcium 9.9  8.4 - 10.5 mg/dL  PSA      Result Value Range   PSA 0.27  <=4.00 ng/mL    Review of Systems  Constitutional: Negative for fever, activity change and appetite change.  HENT: Negative for congestion and rhinorrhea.   Eyes: Negative for discharge.  Respiratory: Negative for cough and wheezing.   Cardiovascular: Negative for chest pain.  Gastrointestinal: Negative for vomiting, abdominal pain and blood in stool.  Genitourinary: Negative for frequency and difficulty urinating.  Musculoskeletal: Negative for neck pain.       Joint pain as noted  Skin: Negative for rash.    Allergic/Immunologic: Negative for environmental allergies and food allergies.  Neurological: Negative for weakness and headaches.  Psychiatric/Behavioral: Negative for agitation.       Objective:   Physical Exam  Vitals reviewed. Constitutional: He appears well-developed and well-nourished.  Morbid obesity present  HENT:  Head: Normocephalic and atraumatic.  Right Ear: External ear normal.  Left Ear: External ear normal.  Nose: Nose normal.  Mouth/Throat: Oropharynx is clear and moist.  Eyes: EOM are normal. Pupils are equal, round, and reactive to light.  Neck: Normal range of motion. Neck supple. No thyromegaly present.  Cardiovascular: Normal rate, regular rhythm and normal heart sounds.   No murmur heard. Pulmonary/Chest: Effort normal and breath sounds normal. No respiratory distress. He has no wheezes.  Abdominal: Soft. Bowel sounds are normal. He exhibits no distension and no mass. There is no tenderness.  Genitourinary: Penis normal.  Musculoskeletal: Normal range of motion. He exhibits no edema.  Lymphadenopathy:  He has no cervical adenopathy.  Neurological: He is alert. He exhibits normal muscle tone.  Skin: Skin is warm and dry. No erythema.  Psychiatric: He has a normal mood and affect. His behavior is normal. Judgment normal.          Assessment & Plan:  Impression 1 morbid obesity #2 on this exam. #3 hypertension good control. #4 sleep apnea with CPAP device stable. #5 impaired fasting glucose stable plan patient would like to try Belfiq 10 mg 1 twice a day #60 with 2 refills. Recheck in 3 months. Diet exercise discussed in encourage. Maintain same medications. WSL

## 2013-05-21 ENCOUNTER — Telehealth: Payer: Self-pay | Admitting: *Deleted

## 2013-05-21 NOTE — Telephone Encounter (Signed)
Waiting for form from insurance for Belviq, will complete and fax in for prior auth

## 2013-05-21 NOTE — Telephone Encounter (Signed)
Form received, completed, & faxed in, now waiting for decision

## 2013-05-21 NOTE — Telephone Encounter (Signed)
Pt calling to check on status of prior auth for phentermine.

## 2013-05-29 ENCOUNTER — Other Ambulatory Visit: Payer: Self-pay | Admitting: Nurse Practitioner

## 2013-05-29 MED ORDER — LORCASERIN HCL 10 MG PO TABS: ORAL_TABLET | ORAL | Status: DC

## 2013-05-30 NOTE — Telephone Encounter (Signed)
Rx was DENIED by insurance due to not having a cardiac workup such as stress test, etc.  Explained to pt, states he may just pay out of pocket, left discount card up front for pt to pick up

## 2013-08-07 ENCOUNTER — Ambulatory Visit: Payer: BC Managed Care – PPO | Admitting: Family Medicine

## 2013-08-29 ENCOUNTER — Ambulatory Visit: Payer: BC Managed Care – PPO | Admitting: Family Medicine

## 2013-08-31 ENCOUNTER — Encounter: Payer: Self-pay | Admitting: Family Medicine

## 2013-08-31 ENCOUNTER — Ambulatory Visit (INDEPENDENT_AMBULATORY_CARE_PROVIDER_SITE_OTHER): Payer: BC Managed Care – PPO | Admitting: Family Medicine

## 2013-08-31 VITALS — BP 124/86 | Ht 72.0 in | Wt 306.2 lb

## 2013-08-31 DIAGNOSIS — R109 Unspecified abdominal pain: Secondary | ICD-10-CM

## 2013-08-31 MED ORDER — CHOLESTYRAMINE 4 GM/DOSE PO POWD
ORAL | Status: DC
Start: 2013-08-31 — End: 2014-01-31

## 2013-08-31 NOTE — Progress Notes (Signed)
   Subjective:    Patient ID: Adam Hahn, male    DOB: 27-Nov-1965, 48 y.o.   MRN: [id number]  Abdominal Pain This is a new problem. The current episode started more than 1 month ago. The problem occurs intermittently. The problem has been unchanged. The pain is located in the generalized abdominal region. The pain is moderate. The quality of the pain is cramping. The abdominal pain does not radiate. Associated symptoms include diarrhea. Nothing aggravates the pain. The pain is relieved by nothing. Treatments tried: immodium. The treatment provided no relief.   Patient has had his gallbladder removed. Patient has no other concerns at this time.   When eats loose stools, high fibet diet  ulgind disc L5   Over past mo  Off and on, loose stools Bloating sens and discomfort Takes gas x prn, nt much crampoing  Loose stols more of  Problem   No cramping pain  bm's two to three  Around looser stools for pawt mo  No fam hx of colon ca, pos fam hx of diverticulitids  No blood in stools     Review of Systems  Gastrointestinal: Positive for abdominal pain and diarrhea.   No vomiting no weight loss no fever or chills no blood in stools ROS otherwise negative    Objective:   Physical Exam Alert no acute distress. HEENT normal. Lungs clear. Heart rare rhythm. Abdominal exam benign no discrete tenderness no rebound no guarding excellent bowel sounds somewhat large       Assessment & Plan:  Impression likely loose stools and cramping secondary to status post cholecystectomy. Possible element of IBS. Plan trial of Questran once again one half packet twice a day. Expect gradual resolution. WSL

## 2013-09-18 ENCOUNTER — Ambulatory Visit: Payer: BC Managed Care – PPO | Admitting: Orthopedic Surgery

## 2013-09-20 ENCOUNTER — Ambulatory Visit (INDEPENDENT_AMBULATORY_CARE_PROVIDER_SITE_OTHER): Payer: BC Managed Care – PPO

## 2013-09-20 ENCOUNTER — Ambulatory Visit (INDEPENDENT_AMBULATORY_CARE_PROVIDER_SITE_OTHER): Payer: BC Managed Care – PPO | Admitting: Orthopedic Surgery

## 2013-09-20 VITALS — BP 120/78 | Ht 72.0 in | Wt 306.0 lb

## 2013-09-20 DIAGNOSIS — M719 Bursopathy, unspecified: Secondary | ICD-10-CM

## 2013-09-20 DIAGNOSIS — M25519 Pain in unspecified shoulder: Secondary | ICD-10-CM

## 2013-09-20 DIAGNOSIS — M67919 Unspecified disorder of synovium and tendon, unspecified shoulder: Secondary | ICD-10-CM

## 2013-09-20 DIAGNOSIS — M25512 Pain in left shoulder: Secondary | ICD-10-CM

## 2013-09-20 DIAGNOSIS — M75100 Unspecified rotator cuff tear or rupture of unspecified shoulder, not specified as traumatic: Secondary | ICD-10-CM

## 2013-09-20 NOTE — Patient Instructions (Addendum)
  Avoid heavy lifting    Joint Injection  Care After  Refer to this sheet in the next few days. These instructions provide you with information on caring for yourself after you have had a joint injection. Your caregiver also may give you more specific instructions. Your treatment has been planned according to current medical practices, but problems sometimes occur. Call your caregiver if you have any problems or questions after your procedure.  After any type of joint injection, it is not uncommon to experience:  Soreness, swelling, or bruising around the injection site.  Mild numbness, tingling, or weakness around the injection site caused by the numbing medicine used before or with the injection. It also is possible to experience the following effects associated with the specific agent after injection:  Iodine-based contrast agents:  Allergic reaction (itching, hives, widespread redness, and swelling beyond the injection site).  Corticosteroids (These effects are rare.):  Allergic reaction.  Increased blood sugar levels (If you have diabetes and you notice that your blood sugar levels have increased, notify your caregiver).  Increased blood pressure levels.  Mood swings.  Hyaluronic acid in the use of viscosupplementation.  Temporary heat or redness.  Temporary rash and itching.  Increased fluid accumulation in the injected joint. These effects all should resolve within a day after your procedure.  HOME CARE INSTRUCTIONS  Limit yourself to light activity the day of your procedure. Avoid lifting heavy objects, bending, stooping, or twisting.  Take prescription or over-the-counter pain medication as directed by your caregiver.  You may apply ice to your injection site to reduce pain and swelling the day of your procedure. Ice may be applied 3-4 times:  Put ice in a plastic bag.  Place a towel between your skin and the bag.  Leave the ice on for no longer than 15-20 minutes each  time. SEEK IMMEDIATE MEDICAL CARE IF:  Pain and swelling get worse rather than better or extend beyond the injection site.  Numbness does not go away.  Blood or fluid continues to leak from the injection site.  You have chest pain.  You have swelling of your face or tongue.  You have trouble breathing or you become dizzy.  You develop a fever, chills, or severe tenderness at the injection site that last longer than 1 day. MAKE SURE YOU:  Understand these instructions.  Watch your condition.  Get help right away if you are not doing well or if you get worse. Document Released: 12/31/2010 Document Revised: 07/12/2011 Document Reviewed: 12/31/2010  [facility] Patient Information 2014 [place].

## 2013-09-20 NOTE — Progress Notes (Signed)
Patient ID: GENEVA PALLAS, male   DOB: 09-Oct-1965, 48 y.o.   MRN: [id number]  No chief complaint on file.  Lateral shoulder pain  48 year old male 3 weeks of pain left shoulder greater than right associated with for elevation. Pain is constant he did try some ibuprofen and rest pain continues described as sharp. No weakness no difficulties with lifting.  Allergies hydrocodone causes itching and hives  Family history of asthma and hypertension and arthritis  Medical history of hypertension arthritis  Multiple surgeries on the left knee including microfracture  Cholecystectomy    Medications Cipro and ibuprofen  Review of systems negative except for visual problems causing classes and joint pain as described  Vital signs: BP 120/78  Ht 6' (1.829 m)  Wt 306 lb (138.801 kg)  BMI 41.49 kg/m2   General the patient is well-developed and well-nourished grooming and hygiene are normal Oriented x3 Mood and affect normal Ambulation normal  Left and right shoulder Left shoulder more symptomatic with positive Neer sign and positive Hawkins maneuver.  Right side mildly positive Neer sign at terminal flexion  Both sides full range of motion joint stable motor exam intact rotator cuff strength 5 skin clean dry and intact  Negative lymph nodes  Neurovascular exam intact  X-rays of the left shoulder normal  Rotator cuff tendinitis  Bilateral shoulder injections Over-the-counter anti-inflammatories 800 mg 3 times a day for 2-3 weeks and back off.  Followup as needed.  Procedure inject subacromial space left shoulder Diagnosis rotator cuff syndrome left shoulder Medication Depo-Medrol 40 mg, 1 cc and lidocaine 1% 3 cc Verbal consent Timeout completed  The injection site was cleaned with alcohol and sprayed with ethyl chloride. From a posterior approach a 20-gauge needle was injected in the subacromial space. The medication went in easily. There were no complications. The  wound was covered with a sterile bandage. Appropriate precautions were given.   Procedure inject subacromial space right shoulder Diagnosis rotator cuff syndrome right shoulder Medication Depo-Medrol 40 mg, 1 cc and lidocaine 1% 3 cc Verbal consent Timeout completed  The injection site was cleaned with alcohol and sprayed with ethyl chloride. From a posterior approach a 20-gauge needle was injected in the subacromial space. The medication went in easily. There were no complications. The wound was covered with a sterile bandage. Appropriate precautions were given.

## 2013-12-03 ENCOUNTER — Telehealth: Payer: Self-pay | Admitting: Family Medicine

## 2013-12-03 NOTE — Telephone Encounter (Signed)
Script faxed. Pt's wife notified.

## 2013-12-03 NOTE — Telephone Encounter (Signed)
[place] and [place] his [facility] Psychologist, prison and probation services.  Please send Rx to [facility] to replace for him. Telephone Number [phone]. Fax Number [phone].  Att: Amy

## 2013-12-03 NOTE — Telephone Encounter (Signed)
Ok lets 

## 2013-12-11 ENCOUNTER — Encounter: Payer: Self-pay | Admitting: Orthopedic Surgery

## 2013-12-11 ENCOUNTER — Ambulatory Visit (INDEPENDENT_AMBULATORY_CARE_PROVIDER_SITE_OTHER): Payer: BC Managed Care – PPO | Admitting: Orthopedic Surgery

## 2013-12-11 ENCOUNTER — Ambulatory Visit (INDEPENDENT_AMBULATORY_CARE_PROVIDER_SITE_OTHER): Payer: BC Managed Care – PPO

## 2013-12-11 VITALS — BP 119/74 | Ht 72.0 in | Wt 311.0 lb

## 2013-12-11 DIAGNOSIS — M7650 Patellar tendinitis, unspecified knee: Secondary | ICD-10-CM

## 2013-12-11 DIAGNOSIS — M25569 Pain in unspecified knee: Secondary | ICD-10-CM

## 2013-12-11 DIAGNOSIS — M25562 Pain in left knee: Secondary | ICD-10-CM

## 2013-12-11 DIAGNOSIS — M7652 Patellar tendinitis, left knee: Secondary | ICD-10-CM

## 2013-12-11 MED ORDER — PREDNISONE (PAK) 10 MG PO TABS: ORAL_TABLET | Freq: Every day | ORAL | Status: DC

## 2013-12-11 NOTE — Progress Notes (Signed)
Established patient new problem  Pain left knee  Previous history of osteoarthritis left knee  Presents now after posttraumatic pain over the patellar tendon insertion. A few weeks back he hit up against something and since that time pain and swelling with weakness on extension in the left knee. With pain centered over the patellar tendon insertion at the tibial tubercle  He notes that with him some over-the-counter medications is feeling a little better but still weak  Review of systems no joint pain at this point   Vital signs are stable as recorded  General appearance is normal, body habitus normal BP 119/74  Ht 6' (1.829 m)  Wt 311 lb (141.069 kg)  BMI 42.17 kg/m2   The patient is alert and oriented x 3  The patient's mood and affect are normal  Gait assessment: Normal  The cardiovascular exam reveals normal pulses and temperature without edema or  swelling.  The lymphatic system is negative for palpable lymph nodes  The sensory exam is normal.  There are no pathologic reflexes.  Balance is normal.   Exam of the left knee  Inspection tibial tubercle is swollen and tender Range of motion knee flexion 125 Stability normal Motor exam reveals weakness in the extensor mechanism  Skin normal, no rash, or laceration. Drawer test normal, Lockman test normal.  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  . Left knee pain Yes   Patellar tendinitis  Plan recommend steroid Dosepak activity modification return in 4 weeks  Meds ordered this encounter  Medications  . predniSONE (STERAPRED UNI-PAK) 10 MG tablet    Sig: Take by mouth daily.    Dispense:  48 tablet    Refill:  0

## 2014-01-08 ENCOUNTER — Ambulatory Visit: Payer: BC Managed Care – PPO | Admitting: Orthopedic Surgery

## 2014-01-29 ENCOUNTER — Telehealth: Payer: Self-pay | Admitting: Orthopedic Surgery

## 2014-01-29 NOTE — Telephone Encounter (Signed)
Call received from patient/patient's wife, states need to cancel his 4-week re-check appointment for 01/31/14, due to work; offered re-schedule -- states will need to call back to re-schedule.

## 2014-01-31 ENCOUNTER — Encounter (HOSPITAL_COMMUNITY): Payer: Self-pay | Admitting: Emergency Medicine

## 2014-01-31 ENCOUNTER — Emergency Department (HOSPITAL_COMMUNITY)
Admission: EM | Admit: 2014-01-31 | Discharge: 2014-01-31 | Disposition: A | Discharge status: Home / Self Care | Payer: BC Managed Care – PPO | Attending: Emergency Medicine | Admitting: Emergency Medicine

## 2014-01-31 ENCOUNTER — Emergency Department (HOSPITAL_COMMUNITY): Payer: BC Managed Care – PPO

## 2014-01-31 ENCOUNTER — Ambulatory Visit: Payer: BC Managed Care – PPO | Admitting: Orthopedic Surgery

## 2014-01-31 DIAGNOSIS — I10 Essential (primary) hypertension: Secondary | ICD-10-CM | POA: Insufficient documentation

## 2014-01-31 DIAGNOSIS — Z9981 Dependence on supplemental oxygen: Secondary | ICD-10-CM | POA: Insufficient documentation

## 2014-01-31 DIAGNOSIS — E669 Obesity, unspecified: Secondary | ICD-10-CM | POA: Insufficient documentation

## 2014-01-31 DIAGNOSIS — R109 Unspecified abdominal pain: Secondary | ICD-10-CM | POA: Diagnosis present

## 2014-01-31 DIAGNOSIS — G4730 Sleep apnea, unspecified: Secondary | ICD-10-CM | POA: Insufficient documentation

## 2014-01-31 DIAGNOSIS — N200 Calculus of kidney: Secondary | ICD-10-CM | POA: Diagnosis not present

## 2014-01-31 DIAGNOSIS — N201 Calculus of ureter: Secondary | ICD-10-CM

## 2014-01-31 LAB — URINALYSIS, ROUTINE W REFLEX MICROSCOPIC
Bilirubin Urine: NEGATIVE
Glucose, UA: NEGATIVE mg/dL
Ketones, ur: NEGATIVE mg/dL
Leukocytes, UA: NEGATIVE
Nitrite: NEGATIVE
Protein, ur: NEGATIVE mg/dL
Specific Gravity, Urine: 1.015 (ref 1.005–1.030)
Urobilinogen, UA: 0.2 mg/dL (ref 0.0–1.0)
pH: 6.5 (ref 5.0–8.0)

## 2014-01-31 LAB — URINE MICROSCOPIC-ADD ON

## 2014-01-31 MED ORDER — SODIUM CHLORIDE 0.9 % IV SOLN
INTRAVENOUS | Status: DC
  Administered 2014-01-31: 21:00:00 via INTRAVENOUS

## 2014-01-31 MED ORDER — ONDANSETRON HCL 4 MG/2ML IJ SOLN
4.0000 mg | Freq: Once | INTRAMUSCULAR | Status: AC
  Administered 2014-01-31: 4 mg via INTRAVENOUS
  Filled 2014-01-31: qty 2

## 2014-01-31 MED ORDER — TAMSULOSIN HCL 0.4 MG PO CAPS
0.4000 mg | ORAL_CAPSULE | Freq: Once | ORAL | Status: AC
  Administered 2014-01-31: 0.4 mg via ORAL
  Filled 2014-01-31: qty 1

## 2014-01-31 MED ORDER — ONDANSETRON HCL 4 MG PO TABS: 4.0000 mg | ORAL_TABLET | Freq: Three times a day (TID) | ORAL | Status: DC | PRN

## 2014-01-31 MED ORDER — OXYCODONE-ACETAMINOPHEN 5-325 MG PO TABS: 1.0000 | ORAL_TABLET | Freq: Four times a day (QID) | ORAL | Status: DC | PRN

## 2014-01-31 MED ORDER — TAMSULOSIN HCL 0.4 MG PO CAPS: ORAL_CAPSULE | ORAL | Status: DC

## 2014-01-31 MED ORDER — OXYCODONE-ACETAMINOPHEN 5-325 MG PO TABS
1.0000 | ORAL_TABLET | Freq: Four times a day (QID) | ORAL | Status: DC | PRN
Start: 2014-01-31 — End: 2014-05-13

## 2014-01-31 MED ORDER — KETOROLAC TROMETHAMINE 30 MG/ML IJ SOLN
30.0000 mg | Freq: Once | INTRAMUSCULAR | Status: AC
  Administered 2014-01-31: 30 mg via INTRAVENOUS
  Filled 2014-01-31: qty 1

## 2014-01-31 MED ORDER — HYDROMORPHONE HCL 1 MG/ML IJ SOLN
1.0000 mg | Freq: Once | INTRAMUSCULAR | Status: AC
  Administered 2014-01-31: 1 mg via INTRAVENOUS
  Filled 2014-01-31: qty 1

## 2014-01-31 NOTE — ED Provider Notes (Addendum)
CSN: [id number]     Arrival date & time 01/31/14  1940 History  This chart was scribed for Janice Norrie, MD by Delphia Grates, ED Scribe. This patient was seen in room APA10/APA10 and the patient's care was started at 8:51 PM.    Chief Complaint  Patient presents with  . Flank Pain     The history is provided by the patient. No language interpreter was used.    HPI Comments: Adam Hahn is a 48 y.o. male who presents to the Emergency Department complaining of sudden onset left flank pain that began approximately 1 hour ago. Patient states his pain radiates to his left lower back to his left lower abdomen and to his testicles and lasted for 15-20 minutes intensely. He rates his pain as 10/10 at its worse, and a 6-7/10 at present. There is associtated nausea. Patient has taken ibuprofen and applied ice PTA with mild relief. Nothing he does makes the pain hurt more. Patient denies dysuria or hematuria. He also denies personal and family history of the same. Patient states he drinks tea daily. He works at [employer] and reports the temperature can reach "100 degrees". Patient reports he stays hydrated and drinks Gatorade when needed. He is a nonsmoker and consumes EtOH on occasion.  PCP: Dr. Wolfgang Phoenix  Past Medical History  Diagnosis Date  . HTN (hypertension)   . Allergic rhinitis   . Sleep apnea     CPAP  . Obesity   . IFG (impaired fasting glucose)    Past Surgical History  Procedure Laterality Date  . Knee surgery      x 5  . Cholecystectomy     Family History  Problem Relation Age of Onset  . Hypertension Father    History  Substance Use Topics  . Smoking status: Never Smoker   . Smokeless tobacco: Not on file  . Alcohol Use: Yes     Comment: occasionally  employed Lives with spouse  Review of Systems  Gastrointestinal: Positive for nausea.  Genitourinary: Positive for flank pain (left). Negative for dysuria and hematuria.  All other systems reviewed and are  negative.     Allergies  Hydrocodone and Lotrel  Home Medications   Prior to Admission medications   Medication Sig Start Date End Date Taking? Authorizing Provider  ibuprofen (ADVIL,MOTRIN) 200 MG tablet Take 800 mg by mouth every 6 (six) hours as needed for mild pain or moderate pain.   Yes Historical Provider, MD  lisinopril (PRINIVIL,ZESTRIL) 20 MG tablet Take 10 mg by mouth daily. Take 1/2 tablet daily   Yes Historical Provider, MD   Triage Vitals: BP 133/78  Pulse 85  Temp(Src) 98.7 F (37.1 C) (Oral)  Resp 18  Ht 6\' 1"  (1.854 m)  Wt 292 lb (132.45 kg)  BMI 38.53 kg/m2  SpO2 97%  Vital signs normal    Physical Exam  Nursing note and vitals reviewed. Constitutional: He is oriented to person, place, and time. He appears well-developed and well-nourished.  Non-toxic appearance. He does not appear ill. No distress.  HENT:  Head: Normocephalic and atraumatic.  Right Ear: External ear normal.  Left Ear: External ear normal.  Nose: Nose normal. No mucosal edema or rhinorrhea.  Mouth/Throat: Oropharynx is clear and moist and mucous membranes are normal. No dental abscesses or uvula swelling.  Eyes: Conjunctivae and EOM are normal. Pupils are equal, round, and reactive to light.  Neck: Normal range of motion and full passive range of motion without  pain. Neck supple.  Cardiovascular: Normal rate, regular rhythm and normal heart sounds.  Exam reveals no gallop and no friction rub.   No murmur heard. Pulmonary/Chest: Effort normal and breath sounds normal. No respiratory distress. He has no wheezes. He has no rhonchi. He has no rales. He exhibits no tenderness and no crepitus.  Abdominal: Soft. Normal appearance and bowel sounds are normal. He exhibits no distension. There is no tenderness. There is no rebound and no guarding.  Genitourinary:  He indicates his pain is in his left flank in an area consistent with the left kidney.  Musculoskeletal: Normal range of motion. He  exhibits no edema and no tenderness.  Moves all extremities well.   Neurological: He is alert and oriented to person, place, and time. He has normal strength. No cranial nerve deficit.  Skin: Skin is warm, dry and intact. No rash noted. No erythema. No pallor.  Psychiatric: He has a normal mood and affect. His speech is normal and behavior is normal. His mood appears not anxious.    ED Course  Procedures (including critical care time)  Medications  0.9 %  sodium chloride infusion ( Intravenous Stopped 01/31/14 2312)  HYDROmorphone (DILAUDID) injection 1 mg (1 mg Intravenous Given 01/31/14 2116)  ondansetron (ZOFRAN) injection 4 mg (4 mg Intravenous Given 01/31/14 2116)  ketorolac (TORADOL) 30 MG/ML injection 30 mg (30 mg Intravenous Given 01/31/14 2152)  tamsulosin (FLOMAX) capsule 0.4 mg (0.4 mg Oral Given 01/31/14 2151)   Percocet #6 prepack given to patient.     DIAGNOSTIC STUDIES: Oxygen Saturation is 97% on room air, adequate by my interpretation.    COORDINATION OF CARE: At 2056 Discussed treatment plan with patient which includes CT scan and pain medication. Patient agrees.   At 2206 Discussed with patient the results of the CT scan, which was positive for a small distal ureteral stone. Informed patient that he does not need to have a lithotripsy and that the stone should pass on its own. Will be discharged with pain medication. Pt advised of plan for treatment and pt agrees. Patient to let the nurse know when his pain has improved enough that he feels he can be discharged.   Results for orders placed during the hospital encounter of 01/31/14  URINALYSIS, ROUTINE W REFLEX MICROSCOPIC      Result Value Ref Range   Color, Urine YELLOW  YELLOW   APPearance CLEAR  CLEAR   Specific Gravity, Urine 1.015  1.005 - 1.030   pH 6.5  5.0 - 8.0   Glucose, UA NEGATIVE  NEGATIVE mg/dL   Hgb urine dipstick TRACE (*) NEGATIVE   Bilirubin Urine NEGATIVE  NEGATIVE   Ketones, ur NEGATIVE   NEGATIVE mg/dL   Protein, ur NEGATIVE  NEGATIVE mg/dL   Urobilinogen, UA 0.2  0.0 - 1.0 mg/dL   Nitrite NEGATIVE  NEGATIVE   Leukocytes, UA NEGATIVE  NEGATIVE  URINE MICROSCOPIC-ADD ON      Result Value Ref Range   WBC, UA 0-2  <3 WBC/hpf   RBC / HPF 0-2  <3 RBC/hpf    Laboratory interpretation all normal except trace blood  Ct Abdomen Pelvis Wo Contrast  01/31/2014   CLINICAL DATA:  Left flank pain, evaluate for renal stone  EXAM: CT ABDOMEN AND PELVIS WITHOUT CONTRAST  TECHNIQUE: Multidetector CT imaging of the abdomen and pelvis was performed following the standard protocol without IV contrast.  COMPARISON:  None.  FINDINGS: Lower chest:  Lung bases are clear.  Hepatobiliary: Mild  hepatic steatosis.  Status post cholecystectomy. No intrahepatic or extrahepatic ductal dilatation.  Pancreas: Within normal limits.  Spleen: Within normal limits.  Adrenals/Urinary Tract: Adrenal glands are unremarkable.  Kidneys are unremarkable. Mild fullness of the left renal collecting system without frank hydronephrosis.  2 mm distal left ureteral calculus at the UVJ (series 4/image 89).  Bladder is within normal limits.  Stomach/Bowel: Stomach is unremarkable.  No evidence of bowel obstruction.  Normal appendix.  Vascular/Lymphatic: No evidence of abdominal aortic aneurysm.  No suspicious abdominopelvic lymphadenopathy.  Reproductive: Prostate is unremarkable.  Other: No abdominopelvic ascites.  Musculoskeletal: Mild degenerative changes of the visualized thoracolumbar spine, most prominent at L5-S1.  IMPRESSION: 2 mm distal left ureteral calculus at the UVJ. Mild fullness of the left renal collecting system without frank hydronephrosis.   Electronically Signed   By: Julian Hy M.D.   On: 01/31/2014 21:38      EKG Interpretation None      MDM   Final diagnoses:  Right ureteral stone     Discharge Medication List as of 01/31/2014 11:05 PM    START taking these medications   Details   ondansetron (ZOFRAN) 4 MG tablet Take 1 tablet (4 mg total) by mouth every 8 (eight) hours as needed for nausea or vomiting., Starting 01/31/2014, Until Discontinued, Print    oxyCODONE-acetaminophen (ROXICET) 5-325 MG per tablet Take 1-2 tablets by mouth every 6 (six) hours as needed for severe pain., Starting 01/31/2014, Until Discontinued, Print    tamsulosin (FLOMAX) 0.4 MG CAPS capsule Take 1 po QD until you pass the stone., Print        Plan discharge   Adam Porter, MD, FACEP   I personally performed the services described in this documentation, which was scribed in my presence. The recorded information has been reviewed and considered.  Adam Porter, MD, FACEP    Janice Norrie, MD 02/01/14 7711  Janice Norrie, MD 02/01/14 [phone]

## 2014-01-31 NOTE — ED Notes (Signed)
Pt was given pre-pack quantity six, Oxycodone-Acetaminophen, educated on usage, pt verbally understands.

## 2014-01-31 NOTE — Discharge Instructions (Signed)
Drink plenty of fluids. Strain your urine and save the stone when you pass it (it will look much smaller than you would imagine). Return to the ED if you get a fever, vomiting or have uncontrolled vomiting or pain. Call [facility] to be rechecked if you haven't passed the stone next week.    Dietary Guidelines to Help Prevent Kidney Stones Your risk of kidney stones can be decreased by adjusting the foods you eat. The most important thing you can do is drink enough fluid. You should drink enough fluid to keep your urine clear or pale yellow. The following guidelines provide specific information for the type of kidney stone you have had. GUIDELINES ACCORDING TO TYPE OF KIDNEY STONE Calcium Oxalate Kidney Stones  Reduce the amount of salt you eat. Foods that have a lot of salt cause your body to release excess calcium into your urine. The excess calcium can combine with a substance called oxalate to form kidney stones.  Reduce the amount of animal protein you eat if the amount you eat is excessive. Animal protein causes your body to release excess calcium into your urine. Ask your dietitian how much protein from animal sources you should be eating.  Avoid foods that are high in oxalates. If you take vitamins, they should have less than 500 mg of vitamin C. Your body turns vitamin C into oxalates. You do not need to avoid fruits and vegetables high in vitamin C. Calcium Phosphate Kidney Stones  Reduce the amount of salt you eat to help prevent the release of excess calcium into your urine.  Reduce the amount of animal protein you eat if the amount you eat is excessive. Animal protein causes your body to release excess calcium into your urine. Ask your dietitian how much protein from animal sources you should be eating.  Get enough calcium from food or take a calcium supplement (ask your dietitian for recommendations). Food sources of calcium that do not increase your risk of kidney stones  include:  Broccoli.  Dairy products, such as cheese and yogurt.  Pudding. Uric Acid Kidney Stones  Do not have more than 6 oz of animal protein per day. FOOD SOURCES Animal Protein Sources  Meat (all types).  Poultry.  Eggs.  Fish, seafood. Foods High in [employer] seasonings.  Soy sauce.  Teriyaki sauce.  Cured and processed meats.  Salted crackers and snack foods.  Fast food.  Canned soups and most canned foods. Foods High in Oxalates  Grains:  Amaranth.  Barley.  Grits.  Wheat germ.  Bran.  Buckwheat flour.  All bran cereals.  Pretzels.  Whole wheat bread.  Vegetables:  Beans (wax).  Beets and beet greens.  Collard greens.  Eggplant.  [place].  Leeks.  Okra.  Parsley.  Rutabagas.  Spinach.  Swiss chard.  Tomato paste.  Fried potatoes.  Sweet potatoes.  Fruits:  Red currants.  Figs.  Kiwi.  Rhubarb.  Meat and Other Protein Sources:  Beans (dried).  Soy burgers and other soybean products.  Miso.  Nuts (peanuts, almonds, pecans, cashews, hazelnuts).  Nut butters.  Sesame seeds and tahini (paste made of sesame seeds).  Poppy seeds.  Beverages:  Chocolate drink mixes.  Soy milk.  Instant iced tea.  Juices made from high-oxalate fruits or vegetables.  Other:  Carob.  Chocolate.  Fruitcake.  Marmalades. Document Released: 08/14/2010 Document Revised: 04/24/2013 Document Reviewed: 03/16/2013 [facility] Patient Information 2015 [place], [place]. This information is not intended to replace advice given  to you by your health care provider. Make sure you discuss any questions you have with your health care provider.  Ureteral Colic (Kidney Stones) Ureteral colic is the result of a condition when kidney stones form inside the kidney. Once kidney stones are formed they may move into the tube that connects the kidney with the bladder (ureter). If this occurs, this condition may cause pain (colic)  in the ureter.  CAUSES  Pain is caused by stone movement in the ureter and the obstruction caused by the stone. SYMPTOMS  The pain comes and goes as the ureter contracts around the stone. The pain is usually intense, sharp, and stabbing in character. The location of the pain may move as the stone moves through the ureter. When the stone is near the kidney the pain is usually located in the back and radiates to the belly (abdomen). When the stone is ready to pass into the bladder the pain is often located in the lower abdomen on the side the stone is located. At this location, the symptoms may mimic those of a urinary tract infection with urinary frequency. Once the stone is located here it often passes into the bladder and the pain disappears completely. TREATMENT   Your caregiver will provide you with medicine for pain relief.  You may require specialized follow-up X-rays.  The absence of pain does not always mean that the stone has passed. It may have just stopped moving. If the urine remains completely obstructed, it can cause loss of kidney function or even complete destruction of the involved kidney. It is your responsibility and in your interest that X-rays and follow-ups as suggested by your caregiver are completed. Relief of pain without passage of the stone can be associated with severe damage to the kidney, including loss of kidney function on that side.  If your stone does not pass on its own, additional measures may be taken by your caregiver to ensure its removal. HOME CARE INSTRUCTIONS   Increase your fluid intake. Water is the preferred fluid since juices containing vitamin C may acidify the urine making it less likely for certain stones (uric acid stones) to pass.  Strain all urine. A strainer will be provided. Keep all particulate matter or stones for your caregiver to inspect.  Take your pain medicine as directed.  Make a follow-up appointment with your caregiver as  directed.  Remember that the goal is passage of your stone. The absence of pain does not mean the stone is gone. Follow your caregiver's instructions.  Only take over-the-counter or prescription medicines for pain, discomfort, or fever as directed by your caregiver. SEEK MEDICAL CARE IF:   Pain cannot be controlled with the prescribed medicine.  You have a fever.  Pain continues for longer than your caregiver advises it should.  There is a change in the pain, and you develop chest discomfort or constant abdominal pain.  You feel faint or pass out. MAKE SURE YOU:   Understand these instructions.  Will watch your condition.  Will get help right away if you are not doing well or get worse. Document Released: 01/27/2005 Document Revised: 08/14/2012 Document Reviewed: 10/14/2010 [facility] Patient Information 2015 [place], [place]. This information is not intended to replace advice given to you by your health care provider. Make sure you discuss any questions you have with your health care provider.

## 2014-01-31 NOTE — ED Notes (Signed)
Patient states he is having lower left back pain that radiates around left flank into left groin/testicle. Patient denies urinary symptoms at this time. Patient A&OX4. Ambulatory without deficit.

## 2014-02-11 MED FILL — Oxycodone w/ Acetaminophen Tab 5-325 MG: ORAL | Qty: 6 | Status: AC

## 2014-02-14 ENCOUNTER — Other Ambulatory Visit: Payer: Self-pay | Admitting: *Deleted

## 2014-04-08 ENCOUNTER — Telehealth: Payer: Self-pay | Admitting: Family Medicine

## 2014-04-08 DIAGNOSIS — Z125 Encounter for screening for malignant neoplasm of prostate: Secondary | ICD-10-CM

## 2014-04-08 DIAGNOSIS — I159 Secondary hypertension, unspecified: Secondary | ICD-10-CM

## 2014-04-08 DIAGNOSIS — Z79899 Other long term (current) drug therapy: Secondary | ICD-10-CM

## 2014-04-08 NOTE — Telephone Encounter (Signed)
Patient's wife notified and verbalized understanding.  

## 2014-04-08 NOTE — Telephone Encounter (Signed)
Patient had Lipid, Liver, met 7 and PSA 04/2013

## 2014-04-08 NOTE — Telephone Encounter (Signed)
Repeat the same labs, thanks

## 2014-04-08 NOTE — Telephone Encounter (Signed)
Patient needs order for labs for upcoming physical in January.

## 2014-04-11 ENCOUNTER — Encounter: Payer: Self-pay | Admitting: Orthopedic Surgery

## 2014-04-11 ENCOUNTER — Ambulatory Visit (INDEPENDENT_AMBULATORY_CARE_PROVIDER_SITE_OTHER): Payer: BC Managed Care – PPO | Admitting: Orthopedic Surgery

## 2014-04-11 VITALS — BP 113/79 | Ht 73.0 in | Wt 292.0 lb

## 2014-04-11 DIAGNOSIS — M75100 Unspecified rotator cuff tear or rupture of unspecified shoulder, not specified as traumatic: Secondary | ICD-10-CM

## 2014-04-11 NOTE — Progress Notes (Signed)
Patient ID: Adam Hahn, male   DOB: 01-06-1966, 48 y.o.   MRN: [id number] Chief complaint bilateral shoulder pain  Patient last seen back in 2015 in May for left shoulder pain and was given a cortisone injection  48 year old male recurrent bilateral shoulder pain left greater than right painful [place] inability to sleep at night pain unrelieved by oral anti-inflammatories no weakness no numbness or tingling no neck pain  Allergy to hydrocodone causes itching and hives family history of asthma hypertension arthritis medical history hypertension multiple surgeries left knee previous cholecystectomy  Review of systems negative except for visual problems and joint pain  BP 113/79 mmHg  Ht 6\' 1"  (1.854 m)  Wt 292 lb (132.45 kg)  BMI 38.53 kg/m2  The patient is well-developed and well-nourished reticular was grooming and hygiene are noted he is oriented 3 his mood and affect are normal he ambulates without a limp  Left and right shoulder evaluation reveals positive Neer impingement signs bilaterally but normal strength in his rotator cuff full range of motion symmetric no lymph node abnormalities and symmetric normal neurovascular exam. Skin intact bilaterally. No instability in either shoulder. Palpable tenderness in the surrounding acromial region bilaterally.  Encounter Diagnosis  Name Primary?  . Rotator cuff syndrome, unspecified laterality Yes    Procedure note the subacromial injection shoulder right and left  Verbal consent was obtained to inject the  right and left  Shoulder  Timeout was completed to confirm the injection site is a subacromial space of the  right then left shoulder   Medication used Depo-Medrol 40 mg and lidocaine 1% 3 cc  Anesthesia was provided by ethyl chloride  The injection was performed in the right then left shoulder posterior subacromial space. After pinning the skin with alcohol and anesthetized the skin with ethyl chloride the  subacromial space was injected using a 20-gauge needle. There were no complications  Sterile dressing was applied.

## 2014-04-17 LAB — LIPID PANEL
Cholesterol: 150 mg/dL (ref 0–200)
HDL: 55 mg/dL (ref >39)
LDL Cholesterol: 81 mg/dL (ref 0–99)
Total CHOL/HDL Ratio: 2.7 Ratio
Triglycerides: 72 mg/dL (ref <150)
VLDL: 14 mg/dL (ref 0–40)

## 2014-04-17 LAB — BASIC METABOLIC PANEL
BUN: 17 mg/dL (ref 6–23)
CHLORIDE: 102 meq/L (ref 96–112)
CO2: 28 meq/L (ref 19–32)
Calcium: 9.6 mg/dL (ref 8.4–10.5)
Creat: 1.21 mg/dL (ref 0.50–1.35)
GLUCOSE: 107 mg/dL — AB (ref 70–99)
POTASSIUM: 4.6 meq/L (ref 3.5–5.3)
Sodium: 138 mEq/L (ref 135–145)

## 2014-04-17 LAB — HEPATIC FUNCTION PANEL
ALT: 25 U/L (ref 0–53)
AST: 19 U/L (ref 0–37)
Albumin: 4.6 g/dL (ref 3.5–5.2)
Alkaline Phosphatase: 64 U/L (ref 39–117)
BILIRUBIN DIRECT: 0.1 mg/dL (ref 0.0–0.3)
BILIRUBIN TOTAL: 0.4 mg/dL (ref 0.2–1.2)
Indirect Bilirubin: 0.3 mg/dL (ref 0.2–1.2)
Total Protein: 7.7 g/dL (ref 6.0–8.3)

## 2014-04-18 ENCOUNTER — Ambulatory Visit: Payer: BC Managed Care – PPO | Admitting: Orthopedic Surgery

## 2014-04-18 LAB — PSA: PSA: 0.27 ng/mL (ref <=4.00)

## 2014-05-13 ENCOUNTER — Encounter: Payer: Self-pay | Admitting: Family Medicine

## 2014-05-13 ENCOUNTER — Ambulatory Visit (INDEPENDENT_AMBULATORY_CARE_PROVIDER_SITE_OTHER): Payer: BLUE CROSS/BLUE SHIELD | Admitting: Family Medicine

## 2014-05-13 VITALS — BP 118/78 | Ht 72.0 in | Wt 293.0 lb

## 2014-05-13 DIAGNOSIS — Z0000 Encounter for general adult medical examination without abnormal findings: Secondary | ICD-10-CM

## 2014-05-13 DIAGNOSIS — G4733 Obstructive sleep apnea (adult) (pediatric): Secondary | ICD-10-CM

## 2014-05-13 DIAGNOSIS — I10 Essential (primary) hypertension: Secondary | ICD-10-CM

## 2014-05-13 NOTE — Progress Notes (Signed)
Subjective:    Patient ID: Adam Hahn, male    DOB: [birth year], 49 y.o.   MRN: [id number]  HPI The patient comes in today for a wellness visit.    A review of their health history was completed.  A review of medications was also completed.  Any needed refills;   Eating habits: health conscious  Falls/  MVA accidents in past few months: none  Regular exercise: walking  Specialist pt sees on regular basis: Adam Hahn - ortho. For chronic joint difficulties  Preventative health issues were discussed.   Additional concerns: none  Sticking with bp meds. No obv side effects and compliant with meds. Numbers good when cked elsewhere. Does not miss pills. Has tried to cut down salt intake. Blood pressure good when checked elsewhere  Pt walks a lot at work and at home, changed diet. Has lost some weight.  Results for orders placed or performed in visit on 04/08/14  Lipid panel  Result Value Ref Range   Cholesterol 150 0 - 200 mg/dL   Triglycerides 72 <150 mg/dL   HDL 55 >39 mg/dL   Total CHOL/HDL Ratio 2.7 Ratio   VLDL 14 0 - 40 mg/dL   LDL Cholesterol 81 0 - 99 mg/dL  Hepatic function panel  Result Value Ref Range   Total Bilirubin 0.4 0.2 - 1.2 mg/dL   Bilirubin, Direct 0.1 0.0 - 0.3 mg/dL   Indirect Bilirubin 0.3 0.2 - 1.2 mg/dL   Alkaline Phosphatase 64 39 - 117 U/L   AST 19 0 - 37 U/L   ALT 25 0 - 53 U/L   Total Protein 7.7 6.0 - 8.3 g/dL   Albumin 4.6 3.5 - 5.2 g/dL  Basic metabolic panel  Result Value Ref Range   Sodium 138 135 - 145 mEq/L   Potassium 4.6 3.5 - 5.3 mEq/L   Chloride 102 96 - 112 mEq/L   CO2 28 19 - 32 mEq/L   Glucose, Bld 107 (H) 70 - 99 mg/dL   BUN 17 6 - 23 mg/dL   Creat 1.21 0.50 - 1.35 mg/dL   Calcium 9.6 8.4 - 10.5 mg/dL  PSA  Result Value Ref Range   PSA 0.27 <=4.00 ng/mL   Hx of glu intolerance, slightly elevated . Also of note patient on CPAP, continues to use. Definitely helps him.   Review of Systems    Constitutional: Negative for fever, activity change and appetite change.       Diminished energy at times.  HENT: Negative for congestion and rhinorrhea.   Eyes: Negative for discharge.  Respiratory: Negative for cough and wheezing.   Cardiovascular: Negative for chest pain.  Gastrointestinal: Negative for vomiting, abdominal pain and blood in stool.  Genitourinary: Negative for frequency and difficulty urinating.  Musculoskeletal: Negative for neck pain.       Chronic joint pain  Skin: Negative for rash.  Allergic/Immunologic: Negative for environmental allergies and food allergies.  Neurological: Negative for weakness and headaches.  Psychiatric/Behavioral: Negative for agitation.       Objective:   Physical Exam  Constitutional: He appears well-developed and well-nourished.  Obesity present. Blood pressure 130/80 on repeat.  HENT:  Head: Normocephalic and atraumatic.  Right Ear: External ear normal.  Left Ear: External ear normal.  Nose: Nose normal.  Mouth/Throat: Oropharynx is clear and moist.  Eyes: EOM are normal. Pupils are equal, round, and reactive to light.  Neck: Normal range of motion. Neck supple. No thyromegaly present.  Cardiovascular: Normal  rate, regular rhythm and normal heart sounds.   No murmur heard. Pulmonary/Chest: Effort normal and breath sounds normal. No respiratory distress. He has no wheezes.  Abdominal: Soft. Bowel sounds are normal. He exhibits no distension and no mass. There is no tenderness.  Genitourinary: Penis normal.  Musculoskeletal: Normal range of motion. He exhibits no edema.  Lymphadenopathy:    He has no cervical adenopathy.  Neurological: He is alert. He exhibits normal muscle tone.  Skin: Skin is warm and dry. No erythema.  Psychiatric: He has a normal mood and affect. His behavior is normal. Judgment normal.  Vitals reviewed.         Assessment & Plan:  Impression 1 wellness exam #2 hypertension good control. Encouraged  to maintain same dose of medications. #3 obesity discussed #4 sleep apnea with effective use of Cpap Machine plan blood work discussed. Exercise diet discussed in encourage. Medications refilled. Recheck in 6 months. WSL

## 2014-05-31 ENCOUNTER — Encounter: Payer: Self-pay | Admitting: Family Medicine

## 2014-05-31 ENCOUNTER — Ambulatory Visit (INDEPENDENT_AMBULATORY_CARE_PROVIDER_SITE_OTHER): Payer: BLUE CROSS/BLUE SHIELD | Admitting: Family Medicine

## 2014-05-31 VITALS — BP 112/80 | Temp 98.1°F | Ht 73.0 in | Wt 302.0 lb

## 2014-05-31 DIAGNOSIS — H00016 Hordeolum externum left eye, unspecified eyelid: Secondary | ICD-10-CM

## 2014-05-31 MED ORDER — GENTAMICIN SULFATE 0.3 % OP SOLN: 1.0000 [drp] | Freq: Four times a day (QID) | OPHTHALMIC | Status: AC

## 2014-05-31 NOTE — Progress Notes (Signed)
   Subjective:    Patient ID: Adam Hahn, male    DOB: February 12, 1966, 49 y.o.   MRN: [id number]  HPI Under left eye, swollen and painful to the touch.  Progressivly getting worst.  Noticed it on Wed.  Using warm compresses.     Review of Systems No headache no cough no nasal discharge no eye injury.    Objective:   Physical Exam  Alert no acute distress lower eyelid somewhat swollen tender questionable pointing towards medial portion      Assessment & Plan:  Impression probable stye discussed plan antibiotic drops. Local measures discussed. WSL

## 2014-05-31 NOTE — Patient Instructions (Signed)

## 2014-07-12 ENCOUNTER — Ambulatory Visit (INDEPENDENT_AMBULATORY_CARE_PROVIDER_SITE_OTHER): Payer: BLUE CROSS/BLUE SHIELD | Admitting: Family Medicine

## 2014-07-12 ENCOUNTER — Encounter: Payer: Self-pay | Admitting: Family Medicine

## 2014-07-12 ENCOUNTER — Telehealth: Payer: Self-pay

## 2014-07-12 VITALS — BP 122/78 | Temp 98.3°F | Ht 73.0 in | Wt 298.0 lb

## 2014-07-12 DIAGNOSIS — M79602 Pain in left arm: Secondary | ICD-10-CM

## 2014-07-12 DIAGNOSIS — M7989 Other specified soft tissue disorders: Secondary | ICD-10-CM | POA: Diagnosis not present

## 2014-07-12 DIAGNOSIS — J329 Chronic sinusitis, unspecified: Secondary | ICD-10-CM | POA: Diagnosis not present

## 2014-07-12 DIAGNOSIS — J310 Chronic rhinitis: Secondary | ICD-10-CM

## 2014-07-12 MED ORDER — FLUTICASONE PROPIONATE 50 MCG/ACT NA SUSP: 2.0000 | Freq: Every day | NASAL | Status: DC

## 2014-07-12 MED ORDER — FEXOFENADINE HCL 180 MG PO TABS: 180.0000 mg | ORAL_TABLET | Freq: Every day | ORAL | Status: DC

## 2014-07-12 MED ORDER — AMOXICILLIN-POT CLAVULANATE 875-125 MG PO TABS: 1.0000 | ORAL_TABLET | Freq: Two times a day (BID) | ORAL | Status: AC

## 2014-07-12 NOTE — Progress Notes (Signed)
   Subjective:    Patient ID: Adam Hahn, male    DOB: 26-Sep-1965, 49 y.o.   MRN: [id number]  Sinusitis This is a new problem. The current episode started in the past 7 days. The problem is unchanged. There has been no fever. The pain is moderate. Associated symptoms include congestion. Treatments tried: flonase, ibuprofen, saline spray. The treatment provided no relief.   No other concerns at this time.   A lot of gunkiness  Yell disch   Frontal headache, cough and blowing nose  Sec blood noted  Patient also notes last week he felt a tender area in his left arm. He states it feels like a tendon or liter. Sore in nature. No history of inflamed veins or thrombophlebitis in the past. Recalls no injury. No headache no chest pain no shortness of breath  Review of Systems  HENT: Positive for congestion.    no vomiting no diarrhea positive blood and nasal discharge     Objective:   Physical Exam  Alert no major distress H&T moderate his congestion frontal tenderness pharynx normal neck supple lungs clear heart rare rhythm left axillary region palpable cordlike prominence throughout the axilla extending down to medial upper forearm      Assessment & Plan:  Impression 1 rhinosinusitis discussed #2 allergic rhinitis discussed #3 left axillary attentional superficial thrombophlebitis plan initiate Aleve 2 tablets twice a day. Antibiotics prescribed. Symptom care discussed. Allegra and Flonase prescribed. Ultrasound left axilla further recommendations based results WSL

## 2014-07-12 NOTE — Telephone Encounter (Signed)
Please call and schedule [place] left arm for this patient on Monday. Called radiology at 5:20 today and all the schedulers had already left for the day and I was told we would have to call back on Monday. Patient can go anytime on Wednesday or Thursday of next week. Order is in the system already.

## 2014-07-15 NOTE — Telephone Encounter (Signed)
Pt notified on voicemail  °

## 2014-07-15 NOTE — Telephone Encounter (Signed)
appt Wednesday register 2:15 for 2:30 appt.

## 2014-07-17 ENCOUNTER — Ambulatory Visit (HOSPITAL_COMMUNITY)
Admission: RE | Admit: 2014-07-17 | Discharge: 2014-07-17 | Disposition: A | Discharge status: Home / Self Care | Payer: BLUE CROSS/BLUE SHIELD | Source: Ambulatory Visit | Attending: Family Medicine | Admitting: Family Medicine

## 2014-07-17 DIAGNOSIS — M79602 Pain in left arm: Secondary | ICD-10-CM | POA: Diagnosis present

## 2014-07-17 DIAGNOSIS — I808 Phlebitis and thrombophlebitis of other sites: Secondary | ICD-10-CM | POA: Diagnosis not present

## 2014-07-22 ENCOUNTER — Other Ambulatory Visit: Payer: Self-pay | Admitting: *Deleted

## 2014-07-22 ENCOUNTER — Telehealth: Payer: Self-pay | Admitting: Orthopedic Surgery

## 2014-07-22 DIAGNOSIS — M1712 Unilateral primary osteoarthritis, left knee: Secondary | ICD-10-CM

## 2014-07-22 MED ORDER — CELECOXIB 200 MG PO CAPS: 200.0000 mg | ORAL_CAPSULE | Freq: Every day | ORAL | Status: DC

## 2014-07-22 NOTE — Telephone Encounter (Signed)
Patient needs new Rx on Celebrex called to [facility], his Rx has ran out, please advise?

## 2014-07-22 NOTE — Telephone Encounter (Signed)
Okay to refill? 

## 2014-07-22 NOTE — Telephone Encounter (Signed)
refill 

## 2014-07-22 NOTE — Telephone Encounter (Signed)
Medication refill sent to pharmacy  

## 2014-07-23 NOTE — Telephone Encounter (Signed)
Medication refilled

## 2014-08-05 ENCOUNTER — Other Ambulatory Visit: Payer: Self-pay | Admitting: Family Medicine

## 2014-09-11 ENCOUNTER — Other Ambulatory Visit: Payer: Self-pay | Admitting: Family Medicine

## 2014-10-08 ENCOUNTER — Telehealth: Payer: Self-pay | Admitting: Family Medicine

## 2014-10-08 NOTE — Telephone Encounter (Signed)
[employer] called and needs a letter of necessity for C PAP supplies Aivan had to buy while he was out of town last year.  They would like to have this letter by Friday, June 10th, if possible.

## 2014-10-23 NOTE — Telephone Encounter (Signed)
Letter faxed to [place]: Adam Hahn.  Copy given to [facility]. BB

## 2015-03-21 ENCOUNTER — Encounter: Payer: Self-pay | Admitting: Family Medicine

## 2015-03-21 ENCOUNTER — Ambulatory Visit (INDEPENDENT_AMBULATORY_CARE_PROVIDER_SITE_OTHER): Payer: BLUE CROSS/BLUE SHIELD | Admitting: Family Medicine

## 2015-03-21 VITALS — BP 114/70 | Temp 98.6°F | Ht 73.0 in | Wt 305.5 lb

## 2015-03-21 DIAGNOSIS — J310 Chronic rhinitis: Secondary | ICD-10-CM

## 2015-03-21 DIAGNOSIS — J329 Chronic sinusitis, unspecified: Secondary | ICD-10-CM | POA: Diagnosis not present

## 2015-03-21 MED ORDER — AZITHROMYCIN 250 MG PO TABS: ORAL_TABLET | ORAL | Status: DC

## 2015-03-21 NOTE — Progress Notes (Signed)
   Subjective:    Patient ID: Adam Hahn, male    DOB: 10/02/1965, 48 y.o.   MRN: [id number]  Sinusitis This is a new problem. The current episode started 1 to 4 weeks ago. The problem is unchanged. There has been no fever. The pain is moderate. Associated symptoms include congestion. (Drainage) Past treatments include oral decongestants. The treatment provided no relief.   Pt noted cong and drainage  Gunky with extra mucus  Using mucinex prn  Post nasal drip  Yellow gunky and much   No sickness close to pt    sith streaks of fblood  Taking bp meds faithfully    Review of Systems  HENT: Positive for congestion.    no back pain no change in bowel habits     Objective:   Physical Exam  Alert vitals stable hydration good. HEENT moderate nasal congestion frontal tenderness pharynx erythematous neck supple lungs clear. Heart regular in rhythm.      Assessment & Plan:  Impression acute rhinosinusitis plan antibiotics prescribed. Symptom care discussed warning signs discussed WSL

## 2015-04-01 ENCOUNTER — Other Ambulatory Visit: Payer: Self-pay | Admitting: *Deleted

## 2015-04-01 ENCOUNTER — Telehealth: Payer: Self-pay | Admitting: Family Medicine

## 2015-04-01 MED ORDER — BENZONATATE 100 MG PO CAPS: 100.0000 mg | ORAL_CAPSULE | Freq: Three times a day (TID) | ORAL | Status: DC | PRN

## 2015-04-01 NOTE — Telephone Encounter (Signed)
Having stuffy head and cough. Cough worse at night. Can he have cough med for night time use.

## 2015-04-01 NOTE — Telephone Encounter (Signed)
Seen 03/21/15 for rhinosinusitis. Prescribed zpack

## 2015-04-01 NOTE — Telephone Encounter (Signed)
Allergic to hydrocodone, consult with dr Nicki Reaper. Tessalon 100mg  one tid #21 and use rob dm as needed. If not better in 5 days call back and let us know. Med sent to pharm. Pt notified.

## 2015-04-01 NOTE — Telephone Encounter (Signed)
Pt is requesting something for post nasal drip to be called into [facility].

## 2015-04-23 ENCOUNTER — Telehealth: Payer: Self-pay | Admitting: Family Medicine

## 2015-04-23 DIAGNOSIS — I10 Essential (primary) hypertension: Secondary | ICD-10-CM

## 2015-04-23 DIAGNOSIS — Z125 Encounter for screening for malignant neoplasm of prostate: Secondary | ICD-10-CM

## 2015-04-23 DIAGNOSIS — Z79899 Other long term (current) drug therapy: Secondary | ICD-10-CM

## 2015-04-23 NOTE — Telephone Encounter (Signed)
Patient had Lipid, liver, Met 7 and PSA 12/15

## 2015-04-23 NOTE — Telephone Encounter (Signed)
Adam Hahn has appointment for a physical January 18th and needs blood work papers to get done before his appointment.  Please call him when they are ready for him to pick up.BB

## 2015-04-24 NOTE — Telephone Encounter (Signed)
Rep same 

## 2015-04-24 NOTE — Telephone Encounter (Signed)
Notified patient bloodwork has been ordered.  

## 2015-04-24 NOTE — Addendum Note (Signed)
Addended by: Jesusita Oka on: 04/24/2015 08:12 AM   Modules accepted: Orders

## 2015-05-01 LAB — HEPATIC FUNCTION PANEL
ALBUMIN: 4.4 g/dL (ref 3.5–5.5)
ALT: 35 IU/L (ref 0–44)
AST: 23 IU/L (ref 0–40)
Alkaline Phosphatase: 61 IU/L (ref 39–117)
BILIRUBIN TOTAL: 0.4 mg/dL (ref 0.0–1.2)
Bilirubin, Direct: 0.13 mg/dL (ref 0.00–0.40)
Total Protein: 7.1 g/dL (ref 6.0–8.5)

## 2015-05-01 LAB — LIPID PANEL
CHOL/HDL RATIO: 2.7 ratio (ref 0.0–5.0)
Cholesterol, Total: 189 mg/dL (ref 100–199)
HDL: 70 mg/dL (ref >39)
LDL CALC: 94 mg/dL (ref 0–99)
Triglycerides: 123 mg/dL (ref 0–149)
VLDL Cholesterol Cal: 25 mg/dL (ref 5–40)

## 2015-05-01 LAB — BASIC METABOLIC PANEL
BUN/Creatinine Ratio: 15 (ref 9–20)
BUN: 16 mg/dL (ref 6–24)
CALCIUM: 9.6 mg/dL (ref 8.7–10.2)
CO2: 24 mmol/L (ref 18–29)
Chloride: 97 mmol/L (ref 96–106)
Creatinine, Ser: 1.09 mg/dL (ref 0.76–1.27)
GFR, EST AFRICAN AMERICAN: 93 mL/min/{1.73_m2} (ref >59)
GFR, EST NON AFRICAN AMERICAN: 80 mL/min/{1.73_m2} (ref >59)
Glucose: 122 mg/dL — ABNORMAL HIGH (ref 65–99)
Potassium: 4.6 mmol/L (ref 3.5–5.2)
Sodium: 137 mmol/L (ref 134–144)

## 2015-05-01 LAB — PSA: Prostate Specific Ag, Serum: 0.3 ng/mL (ref 0.0–4.0)

## 2015-05-13 ENCOUNTER — Encounter: Payer: BLUE CROSS/BLUE SHIELD | Admitting: Family Medicine

## 2015-05-21 ENCOUNTER — Ambulatory Visit (INDEPENDENT_AMBULATORY_CARE_PROVIDER_SITE_OTHER): Payer: BLUE CROSS/BLUE SHIELD | Admitting: Family Medicine

## 2015-05-21 ENCOUNTER — Encounter: Payer: Self-pay | Admitting: Family Medicine

## 2015-05-21 VITALS — BP 122/84 | Ht 71.75 in | Wt 303.0 lb

## 2015-05-21 DIAGNOSIS — I10 Essential (primary) hypertension: Secondary | ICD-10-CM

## 2015-05-21 DIAGNOSIS — R7301 Impaired fasting glucose: Secondary | ICD-10-CM | POA: Diagnosis not present

## 2015-05-21 DIAGNOSIS — Z0000 Encounter for general adult medical examination without abnormal findings: Secondary | ICD-10-CM | POA: Diagnosis not present

## 2015-05-21 MED ORDER — NALTREXONE-BUPROPION HCL ER 8-90 MG PO TB12: ORAL_TABLET | ORAL | Status: DC

## 2015-05-21 NOTE — Progress Notes (Signed)
Subjective:    Patient ID: Adam Hahn, male    DOB: [birth year], 50 y.o.   MRN: [id number]  HPI The patient comes in today for a wellness visit.    A review of their health history was completed.  A review of medications was also completed.  Any needed refills;   Eating habits: health conscious  Falls/  MVA accidents in past few months: none  Regular exercise: walks   Specialist pt sees on regular basis: none  Preventative health issues were discussed.   Additional concerns: none  BP med faithful, handling numbers well, 114 over 74, does not miss a dose, medications are reviewed today.  mostly decent on diet,  A lot of exrcise at work.  Not a flu shot guy  posprostate ca hx oin multi uncles  Results for orders placed or performed in visit on 04/23/15  Lipid panel  Result Value Ref Range   Cholesterol, Total 189 100 - 199 mg/dL   Triglycerides 123 0 - 149 mg/dL   HDL 70 >39 mg/dL   VLDL Cholesterol Cal 25 5 - 40 mg/dL   LDL Calculated 94 0 - 99 mg/dL   Chol/HDL Ratio 2.7 0.0 - 5.0 ratio units  Hepatic function panel  Result Value Ref Range   Total Protein 7.1 6.0 - 8.5 g/dL   Albumin 4.4 3.5 - 5.5 g/dL   Bilirubin Total 0.4 0.0 - 1.2 mg/dL   Bilirubin, Direct 0.13 0.00 - 0.40 mg/dL   Alkaline Phosphatase 61 39 - 117 IU/L   AST 23 0 - 40 IU/L   ALT 35 0 - 44 IU/L  Basic metabolic panel  Result Value Ref Range   Glucose 122 (H) 65 - 99 mg/dL   BUN 16 6 - 24 mg/dL   Creatinine, Ser 1.09 0.76 - 1.27 mg/dL   GFR calc non Af Amer 80 >59 mL/min/1.73   GFR calc Af Amer 93 >59 mL/min/1.73   BUN/Creatinine Ratio 15 9 - 20   Sodium 137 134 - 144 mmol/L   Potassium 4.6 3.5 - 5.2 mmol/L   Chloride 97 96 - 106 mmol/L   CO2 24 18 - 29 mmol/L   Calcium 9.6 8.7 - 10.2 mg/dL  PSA  Result Value Ref Range   Prostate Specific Ag, Serum 0.3 0.0 - 4.0 ng/mL   fam hx of diabrtes, uncle has diabetes two in fact, patient realizes his sugar is rising on the blood  work.  Review of Systems  Constitutional: Negative for fever, activity change and appetite change.  HENT: Negative for congestion and rhinorrhea.   Eyes: Negative for discharge.  Respiratory: Negative for cough and wheezing.   Cardiovascular: Negative for chest pain.  Gastrointestinal: Negative for vomiting, abdominal pain and blood in stool.  Genitourinary: Negative for frequency and difficulty urinating.  Musculoskeletal: Negative for neck pain.  Skin: Negative for rash.  Allergic/Immunologic: Negative for environmental allergies and food allergies.  Neurological: Negative for weakness and headaches.  Psychiatric/Behavioral: Negative for agitation.  All other systems reviewed and are negative.      Objective:   Physical Exam  Constitutional: He appears well-developed and well-nourished.  Morbid obesity evident on bmi  HENT:  Head: Normocephalic and atraumatic.  Right Ear: External ear normal.  Left Ear: External ear normal.  Nose: Nose normal.  Mouth/Throat: Oropharynx is clear and moist.  Eyes: EOM are normal. Pupils are equal, round, and reactive to light.  Neck: Normal range of motion. Neck supple. No thyromegaly present.  Cardiovascular: Normal rate, regular rhythm and normal heart sounds.   No murmur heard. Pulmonary/Chest: Effort normal and breath sounds normal. No respiratory distress. He has no wheezes.  Abdominal: Soft. Bowel sounds are normal. He exhibits no distension and no mass. There is no tenderness.  Genitourinary: Penis normal.  Musculoskeletal: Normal range of motion. He exhibits no edema.  Lymphadenopathy:    He has no cervical adenopathy.  Neurological: He is alert. He exhibits normal muscle tone.  Skin: Skin is warm and dry. No erythema.  Psychiatric: He has a normal mood and affect. His behavior is normal. Judgment normal.  Vitals reviewed.   Blood pressure good on repeat prostate exam within normal limits      Assessment & Plan:  Impression 1  wellness exam #2 hypertension good control meds reviewed to maintain same #3 glucose glucose intolerance discuss major concern getting closer diabetes long discussion held #3 morbid obesity with substantial risk factors discussed patient would like to try medicine plan weight loss medicine per patient request. Diet exercise discussed. Medications refilled. Recheck in several months. WSL

## 2015-06-20 ENCOUNTER — Ambulatory Visit (INDEPENDENT_AMBULATORY_CARE_PROVIDER_SITE_OTHER): Payer: BLUE CROSS/BLUE SHIELD | Admitting: Family Medicine

## 2015-06-20 ENCOUNTER — Encounter: Payer: Self-pay | Admitting: Family Medicine

## 2015-06-20 VITALS — BP 132/88 | Temp 98.3°F | Ht 71.75 in | Wt 306.0 lb

## 2015-06-20 DIAGNOSIS — R21 Rash and other nonspecific skin eruption: Secondary | ICD-10-CM

## 2015-06-20 MED ORDER — VALSARTAN 160 MG PO TABS: 160.0000 mg | ORAL_TABLET | Freq: Every day | ORAL | Status: DC

## 2015-06-20 NOTE — Progress Notes (Signed)
   Subjective:    Patient ID: Adam Hahn, male    DOB: 17-Nov-1965, 50 y.o.   MRN: [id number]  Rash This is a new problem. Episode onset: off and on for 3 weeks. Location: face, arms, chest, abdomen. The rash is characterized by itchiness. Treatments tried: benadryl.    Pt swells up, develops hive-like eruptions. Relates it to his lisinopril. He is been on lisinopril for many years.  No shortness breath no wheezing  Review of Systems  Skin: Positive for rash.       Objective:   Physical Exam Alert lungs clear. Heart rare rhythm H&T normal patches of hives noted on trunk left lateral neck       Assessment & Plan:  Impression potential ACE-induced urticaria discussed plan stop lisinopril. Initiate Diovan rationale discussed symptom care discussed WSL

## 2015-06-23 ENCOUNTER — Ambulatory Visit (INDEPENDENT_AMBULATORY_CARE_PROVIDER_SITE_OTHER): Payer: BLUE CROSS/BLUE SHIELD | Admitting: Family Medicine

## 2015-06-23 ENCOUNTER — Encounter: Payer: Self-pay | Admitting: Family Medicine

## 2015-06-23 VITALS — BP 114/80 | Temp 98.3°F | Ht 71.75 in | Wt 303.0 lb

## 2015-06-23 DIAGNOSIS — R21 Rash and other nonspecific skin eruption: Secondary | ICD-10-CM | POA: Diagnosis not present

## 2015-06-23 MED ORDER — METHYLPREDNISOLONE ACETATE 80 MG/ML IJ SUSP
80.0000 mg | Freq: Once | INTRAMUSCULAR | Status: AC
  Administered 2015-06-23: 80 mg via INTRAMUSCULAR

## 2015-06-23 MED ORDER — PREDNISONE 20 MG PO TABS: ORAL_TABLET | ORAL | Status: DC

## 2015-06-23 NOTE — Progress Notes (Signed)
   Subjective:    Patient ID: Adam Hahn, male    DOB: April 10, 1966, 50 y.o.   MRN: [id number]  Rash The current episode started 1 to 4 weeks ago. The problem is unchanged. The rash is diffuse. The rash is characterized by redness and itchiness. He was exposed to a new medication. Past treatments include antihistamine. The treatment provided no relief.   Patient states that he has no other concerns at this time.  No swollen and throat no trouble breathing no trouble swallowing. Next  Ongoing major itchy urticarial-like eruptions keep crop up on thorax chest neck  Review of Systems  Skin: Positive for rash.   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 We did give an ARB. Research here in the office with patient refills that greater than 90% of folks with Ace induced angioedema can handle ARB's. Patient would like to stick with Diovan if at all possible. Solu-Medrol IM oral prednisone taper rationale discussed call if persists warning signs discussed carefully

## 2015-07-07 ENCOUNTER — Telehealth: Payer: Self-pay | Admitting: Family Medicine

## 2015-07-07 NOTE — Telephone Encounter (Signed)
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 Patient wants to know how similar medications are. States Valsartan is really working for him. Please advise?

## 2015-07-07 NOTE — Telephone Encounter (Signed)
Losartan runs a small rish of bringing the hives back worse, current rec from experts is not to use losartan tho cheaper

## 2015-07-08 NOTE — Telephone Encounter (Signed)
Spoke with patient and informed him per Dr.Steve Luking- Losartan runs a small risk  of bringing the hives back or worse, current recommendation from experts is not to use losartan even though it is cheaper. Patient verbalized understanding and stated that he would just stick with Valsartan.

## 2015-08-08 ENCOUNTER — Encounter: Payer: Self-pay | Admitting: Family Medicine

## 2015-08-08 ENCOUNTER — Ambulatory Visit (INDEPENDENT_AMBULATORY_CARE_PROVIDER_SITE_OTHER): Payer: BLUE CROSS/BLUE SHIELD | Admitting: Family Medicine

## 2015-08-08 VITALS — BP 128/82 | Ht 71.75 in | Wt 305.4 lb

## 2015-08-08 DIAGNOSIS — R739 Hyperglycemia, unspecified: Secondary | ICD-10-CM | POA: Diagnosis not present

## 2015-08-08 DIAGNOSIS — R21 Rash and other nonspecific skin eruption: Secondary | ICD-10-CM

## 2015-08-08 DIAGNOSIS — I10 Essential (primary) hypertension: Secondary | ICD-10-CM

## 2015-08-08 LAB — POCT GLYCOSYLATED HEMOGLOBIN (HGB A1C): Hemoglobin A1C: 5.8

## 2015-08-08 MED ORDER — VERAPAMIL HCL ER 240 MG PO TBCR: 240.0000 mg | EXTENDED_RELEASE_TABLET | Freq: Every day | ORAL | Status: DC

## 2015-08-08 NOTE — Progress Notes (Signed)
   Subjective:    Patient ID: Adam Hahn, male    DOB: 1966/03/09, 50 y.o.   MRN: [id number]  HPI Patietn arrives with return of hives for 4 days -thinks it may be related to a medication  Hives chest and hand and elsewhere  Valsartan wondered , patient thinks hives may well be coming from valsartan but he is not 100% sure. Please see prior notes. Patient did have hives with lisinopril.  No other new medications.  States blood pressure overall good control. Numbers good when checked elsewhere. Next  Has been working hard on his sugar try to cut down sugar intake exercising more. Was due to follow-up in a couple weeks for glucose intolerance would like A1c checked today.  Results for orders placed or performed in visit on 08/08/15  POCT glycosylated hemoglobin (Hb A1C)  Result Value Ref Range   Hemoglobin A1C 5.8         Review of Systems No headache, no major weight loss or weight gain, no chest pain no back pain abdominal pain no change in bowel habits complete ROS otherwise negative     Objective:   Physical Exam  Alert vital stable blood pressure good on repeat HEENT normal lungs clear heart regular in rhythm right medial thigh diffuse hive-like eruption    Assessment & Plan:  Impression 1 hypertension good control discussed #2 hives potentially secondary to valsartan potentially a completely different etiology discussed at length #3 impaired fasting glucose. Good news is A1c today decent at 5.8 plan diet exercise discussed. Stop valsartan. Start verapamil SR. Allergy referral discussed and patient would like to do this WSL

## 2015-08-11 ENCOUNTER — Encounter: Payer: Self-pay | Admitting: Family Medicine

## 2015-08-19 ENCOUNTER — Ambulatory Visit: Payer: BLUE CROSS/BLUE SHIELD | Admitting: Family Medicine

## 2015-09-16 ENCOUNTER — Encounter: Payer: Self-pay | Admitting: Allergy and Immunology

## 2015-09-16 ENCOUNTER — Ambulatory Visit (INDEPENDENT_AMBULATORY_CARE_PROVIDER_SITE_OTHER): Payer: BLUE CROSS/BLUE SHIELD | Admitting: Allergy and Immunology

## 2015-09-16 VITALS — BP 120/80 | HR 86 | Temp 98.4°F | Resp 16 | Ht 72.05 in | Wt 298.2 lb

## 2015-09-16 DIAGNOSIS — R21 Rash and other nonspecific skin eruption: Secondary | ICD-10-CM | POA: Diagnosis not present

## 2015-09-16 DIAGNOSIS — J309 Allergic rhinitis, unspecified: Secondary | ICD-10-CM | POA: Diagnosis not present

## 2015-09-16 DIAGNOSIS — H1010 Acute atopic conjunctivitis, unspecified eye: Secondary | ICD-10-CM | POA: Diagnosis not present

## 2015-09-16 NOTE — Progress Notes (Signed)
NEW PATIENT NOTE  RE: Adam Hahn MRN: [id number] DOB: October 02, 1965 [facility] [name], [place] [id number] Date of Office Visit: 09/16/2015  Dear Adam Kirschner, MD:  I had the pleasure of seeing [facility] today in initial evaluation as you recall-- Subjective:  Adam Hahn is a 50 y.o. male who presents today for Allergy Testing  Assessment:   1. Allergic rhinoconjunctivitis, significant seasonal and perennial hypersensitivities.   2. Dermatographism.   3.      Suspected multiple antihypertensive related rash episodes, improved on verapamil. 4.      Complex medical history. Plan:  Reviewed with Adam Hahn at length Dermatographism and its suspected component to previous episodes, as would seem unlikely to have 3 new medication allergies. 1. Avoidance: Mite, Mold and Pollen  And blood pressure medications as previously. 2. Antihistamine: Claritin or Zyrtec10mg  by mouth once  daily for runny nose or itching. 3. Nasal Spray: Flonase 1-2 spray(s) each nostril once daily for stuffy nose or drainage as needed. 4. Other: Keep written diary of any further hive episodes.  Environment/ingestion/activity/exposures 7. Nasal Saline wash after mowing lawn/outdoor activities. 8. Follow up Visit: 2-3 months or sooner if needed.  HPI: Adam Hahn presents to the office with concern for medication related hives.  Adam Hahn reports his difficulty began aproximately 2 months ago,  With about 5 episodes of "hives" at his arms, chest and face, without swelling of lips, tongue or throat.   Adam Hahn reports having a pruritic warm skin sensation, then notes the red, raised texture areas with significant itching. Adam Hahn does not believe that the scratching, creates the hives. Adam Hahn does not describe himself as a sensitive skin individual and has no restrictions to soap/lotions or detergents. Denies any previous history of chronic rash, hives or eczema.  Adam Hahn correlated the episodes with  his Lisinopril (taken for 10 years) and therefore was changed to Losartan but had similar difficulty after one week on new meds. Therefore was changed to Valsartan and had similar difficulty.  Adam Hahn now is tolerating verapamil without difficulty.  After initial episodes did complete course of prednisone, which seemed beneficial.  Adam Hahn has no restrictions to his diet or known food sensitivities, though no report in 30 years and no specific concern for ingestion or other exposure trigger. Adam Hahn does describe year-round rhinorrhea, congestion, sneezing, itchy watery eyes, which may be more prominent with pollen, outdoor, or floral perfume exposures.  Symptoms are very mild and well-controlled with Claritin or Flonase intermittently.  Denies ED or Urgent care visits, or antibiotic courses.  No disruption of sleep or activity.  Medical History: Past Medical History  Diagnosis Date  . HTN (hypertension)   . Allergic rhinitis   . Sleep apnea     CPAP  . Obesity   . IFG (impaired fasting glucose)    Surgical History: Past Surgical History  Procedure Laterality Date  . Knee surgery      x 5  . Cholecystectomy     Family History: Family History  Problem Relation Age of Onset  . Hypertension Father   . Asthma Mother   . Allergic rhinitis Neg Hx   . Angioedema Neg Hx   . Atopy Neg Hx   . Eczema Neg Hx   . Immunodeficiency Neg Hx   . Urticaria Neg Hx    Social History: Social History  . Marital Status: Married    Spouse Name: N/A  . Number of Children: N/A  . Years of  Education: N/A   Social History Main Topics  . Smoking status: Never Smoker   . Smokeless tobacco: Not on file  . Alcohol Use: Yes     Comment: occasionally  . Drug Use: No  . 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  Adam Hahn has a current medication list which includes the following prescription(s): fluticasone,  meloxicam, verapamil, celecoxib, cholestyramine.   Drug Allergies: Allergies  Allergen Reactions  . Hydrocodone Hives  . Lotrel [Amlodipine Besy-Benazepril Hcl] Other (See Comments)    lethargic  . Lisinopril Rash   Environmental History: Adam Hahn lives in a 48 year old house since built with wood floors, with central heat and air; stuffed mattress, non-feather pillow/comforter with air purifier, without pets or smokers.   Review of Systems  Constitutional: Negative for fever, weight loss and malaise/fatigue.  HENT: Positive for congestion. Negative for ear pain, hearing loss, nosebleeds and sore throat.   Eyes: Negative for blurred vision, double vision, photophobia, pain, discharge and redness.  Respiratory: Negative for shortness of breath.        Denies history of bronchitis and pneumonia.  Gastrointestinal: Negative for heartburn, nausea, vomiting, abdominal pain, diarrhea and constipation.  Genitourinary: Negative.   Musculoskeletal: Negative for myalgias and joint pain.  Skin: Positive for rash. Negative for itching.  Neurological: Negative.  Negative for dizziness, seizures, weakness and headaches.  Endo/Heme/Allergies: Positive for environmental allergies.       Denies sensitivity to aspirin, NSAIDs, stinging insects, foods, latex, jewelry and cosmetics.  Immunological: No chronic or recurring infections. Objective:   Filed Vitals:   09/16/15 0938  BP: 120/80  Pulse: 86  Temp: 98.4 F (36.9 C)  Resp: 16   SpO2 Readings from Last 1 Encounters:  09/16/15 95%   Physical Exam  Constitutional: Adam Hahn is well-developed, well-nourished, and in no distress.  HENT:  Head: Atraumatic.  Right Ear: Tympanic membrane and ear canal normal.  Left Ear: Tympanic membrane and ear canal normal.  Nose: Mucosal edema present. No rhinorrhea. No epistaxis.  Mouth/Throat: Oropharynx is clear and moist and mucous membranes are normal. No oropharyngeal exudate, posterior oropharyngeal edema  or posterior oropharyngeal erythema.  Eyes: Conjunctivae are normal.  Neck: Neck supple.  Cardiovascular: Normal rate, S1 normal and S2 normal.   No murmur heard. Pulmonary/Chest: Effort normal and breath sounds normal. Adam Hahn has no wheezes. Adam Hahn has no rhonchi. Adam Hahn has no rales.  Abdominal: Soft. Bowel sounds are normal.  Lymphadenopathy:    Adam Hahn has no cervical adenopathy.  Neurological: Adam Hahn is alert.  Skin: Skin is warm and intact. No rash noted. No cyanosis. Nails show no clubbing.   Diagnostics:    Skin testing:  Very strong reactivity to multiple grass pollens, pecan tree pollen and cat hair;  strong reactivity to dust mite, dog epithelial , feathers and ragweed/Oak tree pollen; mild reactivity to mutiple mold species and additional tree pollens and negative to selected foods.      Roselyn M. Ishmael Holter, MD   cc: Mickie Hillier, MD

## 2015-09-16 NOTE — Patient Instructions (Addendum)
Take Home Sheet  Dermatographism  1. Avoidance: Mite, Mold and Pollen  And blood pressure medications as previously.   2. Antihistamine: Claritin or Zyrtec10mg  by mouth once  daily for runny nose or itching.   3. Nasal Spray: Flonase 1-2 spray(s) each nostril once daily for stuffy nose or drainage as needed.   4. Other: Keep written diary of any further hive episodes.  Environment/ingestion/activity/exposures   7. Nasal Saline wash after mowing lawn/outdoor activities.   8. Follow up Visit: 2-3 months or sooner if needed.   Websites that have reliable Patient information: 1. American Academy of Asthma, Allergy, & Immunology: www.aaaai.org 2. Food Allergy Network: www.foodallergy.org 3. Mothers of Asthmatics: www.aanma.org 4. [place]: DiningCalendar.de 5. American College of Allergy, Asthma, & Immunology: https://robertson.info/ or www.acaai.org

## 2015-09-22 ENCOUNTER — Ambulatory Visit (INDEPENDENT_AMBULATORY_CARE_PROVIDER_SITE_OTHER): Payer: BLUE CROSS/BLUE SHIELD | Admitting: Family Medicine

## 2015-09-22 ENCOUNTER — Encounter: Payer: Self-pay | Admitting: Family Medicine

## 2015-09-22 VITALS — BP 124/82 | Temp 98.4°F | Ht 71.75 in | Wt 299.5 lb

## 2015-09-22 DIAGNOSIS — R350 Frequency of micturition: Secondary | ICD-10-CM

## 2015-09-22 DIAGNOSIS — N410 Acute prostatitis: Secondary | ICD-10-CM | POA: Diagnosis not present

## 2015-09-22 LAB — POCT URINALYSIS DIPSTICK
Blood, UA: NEGATIVE
LEUKOCYTES UA: NEGATIVE
Spec Grav, UA: 1.02
Urobilinogen, UA: 2
pH, UA: 5

## 2015-09-22 MED ORDER — CIPROFLOXACIN HCL 500 MG PO TABS: ORAL_TABLET | ORAL | Status: DC

## 2015-09-22 NOTE — Patient Instructions (Signed)

## 2015-09-22 NOTE — Progress Notes (Signed)
   Subjective:    Patient ID: Adam Hahn, male    DOB: Aug 24, 1965, 50 y.o.   MRN: [id number]  Urinary Frequency  This is a new problem. Associated symptoms include frequency. He has tried nothing for the symptoms.    Patient states no other concerns this visit.   Results for orders placed or performed in visit on 09/22/15  POCT urinalysis dipstick  Result Value Ref Range   Color, UA Yellow    Clarity, UA Clear    Glucose, UA     Bilirubin, UA     Ketones, UA     Spec Grav, UA 1.020    Blood, UA Negative    pH, UA 5.0    Protein, UA     Urobilinogen, UA 2.0    Nitrite, UA     Leukocytes, UA Negative Negative   slight dysuria at most. Mild low back discomfort. Frequent urination.  Review of Systems  Genitourinary: Positive for frequency.       Objective:   Physical Exam  Alert vitals stable lungs clear heart rare rhythm abdomen benign positive boggy prostate gland      Assessment & Plan:  Impression acute prostatitis plan Cipro twice a day 21 days symptom care discussed warning signs discussed WSL

## 2015-10-31 ENCOUNTER — Ambulatory Visit (INDEPENDENT_AMBULATORY_CARE_PROVIDER_SITE_OTHER): Payer: BLUE CROSS/BLUE SHIELD | Admitting: Family Medicine

## 2015-10-31 ENCOUNTER — Other Ambulatory Visit: Payer: Self-pay | Admitting: Family Medicine

## 2015-10-31 VITALS — BP 134/86

## 2015-10-31 DIAGNOSIS — M792 Neuralgia and neuritis, unspecified: Secondary | ICD-10-CM | POA: Diagnosis not present

## 2015-10-31 NOTE — Patient Instructions (Signed)
If not improving over the next 2 to 3 weeks please call may need nerve conduction test

## 2015-10-31 NOTE — Progress Notes (Signed)
   Subjective:    Patient ID: Adam Hahn, male    DOB: [birth year], 50 y.o.   MRN: [id number]  HPI  Patient arrives with c/o left shoulder pain and numbness. Patients had onset of slight discomfort in the triceps region along with intermittent numbness in the left TM denies any weakness denies any injury states occasional discomfort goes down the arm. Denies unilateral numbness weakness in regards to face her legs no difficulty speaking or swallowing. Review of Systems See above.    Objective:   Physical Exam  On physical exam lungs clear hearts regular strength bilateral is normal reflexes are normal. There is some slight tenderness in the left trapezius region in the back of the tricep.      Assessment & Plan:  Patient with mild neuralgia in paresthesia. I believe that this is related into a nerve impingement is possible that it could be in his neck but is also possible could be in elbow region I doubt carpal tunnel. Anti-inflammatory on a daily basis over the course of next 2-3 weeks. If not improving the course of next several notify us in the next step would be referral to neurology for nerve conductions possible EMG.

## 2015-11-06 ENCOUNTER — Ambulatory Visit: Payer: BLUE CROSS/BLUE SHIELD | Admitting: Family Medicine

## 2015-12-16 ENCOUNTER — Ambulatory Visit: Payer: BLUE CROSS/BLUE SHIELD | Admitting: Allergy & Immunology

## 2016-01-01 ENCOUNTER — Other Ambulatory Visit: Payer: Self-pay | Admitting: Family Medicine

## 2016-02-03 ENCOUNTER — Other Ambulatory Visit: Payer: Self-pay | Admitting: Family Medicine

## 2016-03-11 ENCOUNTER — Other Ambulatory Visit: Payer: Self-pay | Admitting: Family Medicine

## 2016-03-22 ENCOUNTER — Encounter: Payer: Self-pay | Admitting: Family Medicine

## 2016-03-22 ENCOUNTER — Ambulatory Visit (INDEPENDENT_AMBULATORY_CARE_PROVIDER_SITE_OTHER): Payer: BLUE CROSS/BLUE SHIELD | Admitting: Family Medicine

## 2016-03-22 VITALS — BP 150/90 | Temp 98.1°F | Wt 305.0 lb

## 2016-03-22 DIAGNOSIS — N410 Acute prostatitis: Secondary | ICD-10-CM

## 2016-03-22 DIAGNOSIS — R350 Frequency of micturition: Secondary | ICD-10-CM

## 2016-03-22 LAB — POCT URINALYSIS DIPSTICK: pH, UA: 5

## 2016-03-22 MED ORDER — CIPROFLOXACIN HCL 500 MG PO TABS: 500.0000 mg | ORAL_TABLET | Freq: Two times a day (BID) | ORAL | 0 refills | Status: AC

## 2016-03-22 NOTE — Progress Notes (Signed)
   Subjective:    Patient ID: Adam Hahn, male    DOB: 1965-12-15, 50 y.o.   MRN: [id number]  Urinary Frequency   This is a new problem. Episode onset: 3 days. Associated symptoms include frequency. He has tried nothing for the symptoms.   Past history significant for prostatitis this past spring Used hot peppers and flonase   Some prn  incr urin freq  incr frequency, was goetting up more frequently  Last night Somewhat better   Review of Systems  Genitourinary: Positive for frequency.   No fever no chills no vomiting    Objective:   Physical Exam  Alert vitals stable, NAD. Blood pressure good on repeat. HEENT normal. Lungs clear. Heart regular rate and rhythm. Prostate gland soft boggy tender enlarged  Urinalysis 0-2 white blood cells      Assessment & Plan:  Impression acute prostatitis. Patient did not use any cold medication plan Cipro 500 twice a day 21 days. Gen. questions regarding this diagnosis answered WSL

## 2016-04-02 DIAGNOSIS — G4733 Obstructive sleep apnea (adult) (pediatric): Secondary | ICD-10-CM | POA: Diagnosis not present

## 2016-04-05 ENCOUNTER — Other Ambulatory Visit: Payer: Self-pay | Admitting: Family Medicine

## 2016-04-05 NOTE — Telephone Encounter (Signed)
3 refoills

## 2016-04-05 NOTE — Telephone Encounter (Signed)
3 refills 

## 2016-04-15 ENCOUNTER — Telehealth: Payer: Self-pay

## 2016-04-15 MED ORDER — CIPROFLOXACIN HCL 750 MG PO TABS: 750.0000 mg | ORAL_TABLET | Freq: Two times a day (BID) | ORAL | 0 refills | Status: DC

## 2016-04-15 NOTE — Telephone Encounter (Signed)
Shawn notified med was sent to pharmacy.

## 2016-04-15 NOTE — Telephone Encounter (Signed)
Seen a few weeks ago for prostate infection still having frequent urination. Can another round of antibiotics be sent to [facility]

## 2016-04-15 NOTE — Telephone Encounter (Signed)
I would recommend increase dose of ciprofloxacin 750 mg 1 twice a day, #42-if ongoing symptoms follow-up

## 2016-05-11 ENCOUNTER — Telehealth: Payer: Self-pay | Admitting: Family Medicine

## 2016-05-11 DIAGNOSIS — Z79899 Other long term (current) drug therapy: Secondary | ICD-10-CM

## 2016-05-11 DIAGNOSIS — I10 Essential (primary) hypertension: Secondary | ICD-10-CM

## 2016-05-11 DIAGNOSIS — Z125 Encounter for screening for malignant neoplasm of prostate: Secondary | ICD-10-CM

## 2016-05-11 DIAGNOSIS — Z0000 Encounter for general adult medical examination without abnormal findings: Secondary | ICD-10-CM

## 2016-05-11 DIAGNOSIS — Z13220 Encounter for screening for lipoid disorders: Secondary | ICD-10-CM

## 2016-05-11 NOTE — Telephone Encounter (Signed)
Patient requesting blood work for physical on 05/24/16 with Dr. Richardson Landry.

## 2016-05-11 NOTE — Telephone Encounter (Signed)
Lip liv m7 psa 

## 2016-05-11 NOTE — Telephone Encounter (Signed)
bloodwork orders ready. Pt notified.  

## 2016-05-14 DIAGNOSIS — I10 Essential (primary) hypertension: Secondary | ICD-10-CM | POA: Diagnosis not present

## 2016-05-14 DIAGNOSIS — Z79899 Other long term (current) drug therapy: Secondary | ICD-10-CM | POA: Diagnosis not present

## 2016-05-14 DIAGNOSIS — Z13220 Encounter for screening for lipoid disorders: Secondary | ICD-10-CM | POA: Diagnosis not present

## 2016-05-14 DIAGNOSIS — Z0000 Encounter for general adult medical examination without abnormal findings: Secondary | ICD-10-CM | POA: Diagnosis not present

## 2016-05-15 LAB — BASIC METABOLIC PANEL
BUN/Creatinine Ratio: 12 (ref 9–20)
BUN: 13 mg/dL (ref 6–24)
CALCIUM: 9.7 mg/dL (ref 8.7–10.2)
CO2: 25 mmol/L (ref 18–29)
CREATININE: 1.13 mg/dL (ref 0.76–1.27)
Chloride: 102 mmol/L (ref 96–106)
GFR calc Af Amer: 88 mL/min/{1.73_m2} (ref >59)
GFR, EST NON AFRICAN AMERICAN: 76 mL/min/{1.73_m2} (ref >59)
Glucose: 125 mg/dL — ABNORMAL HIGH (ref 65–99)
Potassium: 4.6 mmol/L (ref 3.5–5.2)
Sodium: 141 mmol/L (ref 134–144)

## 2016-05-15 LAB — LIPID PANEL
Chol/HDL Ratio: 3.6 ratio units (ref 0.0–5.0)
Cholesterol, Total: 158 mg/dL (ref 100–199)
HDL: 44 mg/dL (ref >39)
LDL CALC: 86 mg/dL (ref 0–99)
Triglycerides: 142 mg/dL (ref 0–149)
VLDL Cholesterol Cal: 28 mg/dL (ref 5–40)

## 2016-05-15 LAB — HEPATIC FUNCTION PANEL
ALBUMIN: 4.6 g/dL (ref 3.5–5.5)
ALT: 28 IU/L (ref 0–44)
AST: 29 IU/L (ref 0–40)
Alkaline Phosphatase: 56 IU/L (ref 39–117)
Bilirubin Total: 0.4 mg/dL (ref 0.0–1.2)
Bilirubin, Direct: 0.13 mg/dL (ref 0.00–0.40)
TOTAL PROTEIN: 7.2 g/dL (ref 6.0–8.5)

## 2016-05-15 LAB — PSA: Prostate Specific Ag, Serum: 0.2 ng/mL (ref 0.0–4.0)

## 2016-05-24 ENCOUNTER — Ambulatory Visit (INDEPENDENT_AMBULATORY_CARE_PROVIDER_SITE_OTHER): Payer: BLUE CROSS/BLUE SHIELD | Admitting: Family Medicine

## 2016-05-24 ENCOUNTER — Encounter: Payer: Self-pay | Admitting: Family Medicine

## 2016-05-24 VITALS — BP 130/82 | Ht 72.5 in | Wt 306.0 lb

## 2016-05-24 DIAGNOSIS — R7303 Prediabetes: Secondary | ICD-10-CM | POA: Insufficient documentation

## 2016-05-24 DIAGNOSIS — Z0000 Encounter for general adult medical examination without abnormal findings: Secondary | ICD-10-CM | POA: Diagnosis not present

## 2016-05-24 MED ORDER — VERAPAMIL HCL ER 240 MG PO TBCR: 240.0000 mg | EXTENDED_RELEASE_TABLET | Freq: Every day | ORAL | 5 refills | Status: DC

## 2016-05-24 MED ORDER — CHOLESTYRAMINE 4 GM/DOSE PO POWD: ORAL | 5 refills | Status: DC

## 2016-05-24 NOTE — Progress Notes (Signed)
Subjective:    Patient ID: Adam Hahn, male    DOB: May 28, 1965, 51 y.o.   MRN: [id number]  HPI The patient comes in today for a wellness visit.  Results for orders placed or performed in visit on 05/11/16  Lipid panel  Result Value Ref Range   Cholesterol, Total 158 100 - 199 mg/dL   Triglycerides 142 0 - 149 mg/dL   HDL 44 >39 mg/dL   VLDL Cholesterol Cal 28 5 - 40 mg/dL   LDL Calculated 86 0 - 99 mg/dL   Chol/HDL Ratio 3.6 0.0 - 5.0 ratio units  Hepatic function panel  Result Value Ref Range   Total Protein 7.2 6.0 - 8.5 g/dL   Albumin 4.6 3.5 - 5.5 g/dL   Bilirubin Total 0.4 0.0 - 1.2 mg/dL   Bilirubin, Direct 0.13 0.00 - 0.40 mg/dL   Alkaline Phosphatase 56 39 - 117 IU/L   AST 29 0 - 40 IU/L   ALT 28 0 - 44 IU/L  Basic metabolic panel  Result Value Ref Range   Glucose 125 (H) 65 - 99 mg/dL   BUN 13 6 - 24 mg/dL   Creatinine, Ser 1.13 0.76 - 1.27 mg/dL   GFR calc non Af Amer 76 >59 mL/min/1.73   GFR calc Af Amer 88 >59 mL/min/1.73   BUN/Creatinine Ratio 12 9 - 20   Sodium 141 134 - 144 mmol/L   Potassium 4.6 3.5 - 5.2 mmol/L   Chloride 102 96 - 106 mmol/L   CO2 25 18 - 29 mmol/L   Calcium 9.7 8.7 - 10.2 mg/dL  PSA  Result Value Ref Range   Prostate Specific Ag, Serum 0.2 0.0 - 4.0 ng/mL     A review of their health history was completed.  A review of medications was also completed.  Any needed refills: cholestraymine  Eating habits: good  Falls/  MVA accidents in past few months: none   Regular exercise: good  Four to five d execising regulalry   Diet would self grade one to ten, about six,  Specialist pt sees on regular basis: orthopedic  Preventative health issues were discussed.   Additional concerns: none  Pt "knows" cipro can elevate sugar  Seen at the [place], told his sugars were "borderline"   Review of Systems  Constitutional: Negative for activity change, appetite change and fever.  HENT: Negative for congestion and rhinorrhea.     Eyes: Negative for discharge.  Respiratory: Negative for cough and wheezing.   Cardiovascular: Negative for chest pain.  Gastrointestinal: Negative for abdominal pain, blood in stool and vomiting.  Genitourinary: Negative for difficulty urinating and frequency.  Musculoskeletal: Negative for neck pain.  Skin: Negative for rash.  Allergic/Immunologic: Negative for environmental allergies and food allergies.  Neurological: Negative for weakness and headaches.  Psychiatric/Behavioral: Negative for agitation.  All other systems reviewed and are negative.      Objective:   Physical Exam  Constitutional: He appears well-developed and well-nourished.  Substantial obesity present with high BMI  HENT:  Head: Normocephalic and atraumatic.  Right Ear: External ear normal.  Left Ear: External ear normal.  Nose: Nose normal.  Mouth/Throat: Oropharynx is clear and moist.  Eyes: EOM are normal. Pupils are equal, round, and reactive to light.  Neck: Normal range of motion. Neck supple. No thyromegaly present.  Cardiovascular: Normal rate, regular rhythm and normal heart sounds.   No murmur heard. Pulmonary/Chest: Effort normal and breath sounds normal. No respiratory distress. He has no  wheezes.  Abdominal: Soft. Bowel sounds are normal. He exhibits no distension and no mass. There is no tenderness.  Genitourinary: Penis normal.  Musculoskeletal: Normal range of motion. He exhibits no edema.  Lymphadenopathy:    He has no cervical adenopathy.  Neurological: He is alert. He exhibits normal muscle tone.  Skin: Skin is warm and dry. No erythema.  Psychiatric: He has a normal mood and affect. His behavior is normal. Judgment normal.  Vitals reviewed.         Assessment & Plan:  Impression 1 wellness exam #2 morbid obesity discussed with the importance of diet and exercise #3 prediabetes. [facility]'s [facility] clinician initiated metformin plusses and minuses discussed patient wishes to hold off at  this time plan diet exercise discussed. Maintain same medications. Recheck in 6 months. We'll repeat A1c then. Patient to think about potential bariatric interventions down the road if his own attempts a weight loss to not work

## 2016-05-28 DIAGNOSIS — R350 Frequency of micturition: Secondary | ICD-10-CM | POA: Diagnosis not present

## 2016-05-28 DIAGNOSIS — R3915 Urgency of urination: Secondary | ICD-10-CM | POA: Diagnosis not present

## 2016-05-28 DIAGNOSIS — Z6839 Body mass index (BMI) 39.0-39.9, adult: Secondary | ICD-10-CM | POA: Diagnosis not present

## 2016-06-11 ENCOUNTER — Encounter: Payer: Self-pay | Admitting: Family Medicine

## 2016-06-11 ENCOUNTER — Ambulatory Visit (INDEPENDENT_AMBULATORY_CARE_PROVIDER_SITE_OTHER): Payer: BLUE CROSS/BLUE SHIELD | Admitting: Family Medicine

## 2016-06-11 VITALS — Temp 98.8°F | Ht 72.5 in | Wt 313.5 lb

## 2016-06-11 DIAGNOSIS — J329 Chronic sinusitis, unspecified: Secondary | ICD-10-CM

## 2016-06-11 DIAGNOSIS — J310 Chronic rhinitis: Secondary | ICD-10-CM

## 2016-06-11 MED ORDER — AZITHROMYCIN 250 MG PO TABS: ORAL_TABLET | ORAL | 0 refills | Status: DC

## 2016-06-11 NOTE — Progress Notes (Signed)
   Subjective:    Patient ID: Adam Hahn, male    DOB: 26-Aug-1965, 51 y.o.   MRN: [id number]  Sinusitis  This is a new problem. The current episode started in the past 7 days. Associated symptoms include coughing and a sore throat. Past treatments include saline sprays (Flonase).   Patient states no other concerns this visit.   Started four days ago  First nticed nasal cong  Felt stuffy and yelow gunky mucuss  Post nasal drip   Frontal heafache not constant   No sig fever   Kept working   ibu 800 tid prn    Review of Systems  HENT: Positive for sore throat.   Respiratory: Positive for cough.        Objective:   Physical Exam Alert, mild malaise. Hydration good Vitals stable. frontal/ maxillary tenderness evident positive nasal congestion. pharynx normal neck supple  lungs clear/no crackles or wheezes. heart regular in rhythm        Assessment & Plan:  Impression rhinosinusitis likely post viral, discussed with patient. plan antibiotics prescribed. Questions answered. Symptomatic care discussed. warning signs discussed. WSL

## 2016-07-29 DIAGNOSIS — L700 Acne vulgaris: Secondary | ICD-10-CM | POA: Diagnosis not present

## 2016-07-29 DIAGNOSIS — L918 Other hypertrophic disorders of the skin: Secondary | ICD-10-CM | POA: Diagnosis not present

## 2016-08-25 DIAGNOSIS — L239 Allergic contact dermatitis, unspecified cause: Secondary | ICD-10-CM | POA: Diagnosis not present

## 2016-09-22 ENCOUNTER — Encounter: Payer: Self-pay | Admitting: Family Medicine

## 2016-09-22 ENCOUNTER — Ambulatory Visit (INDEPENDENT_AMBULATORY_CARE_PROVIDER_SITE_OTHER): Payer: BLUE CROSS/BLUE SHIELD | Admitting: Family Medicine

## 2016-09-22 VITALS — BP 126/84 | Ht 72.5 in | Wt 304.4 lb

## 2016-09-22 DIAGNOSIS — S39012A Strain of muscle, fascia and tendon of lower back, initial encounter: Secondary | ICD-10-CM

## 2016-09-22 MED ORDER — CHLORZOXAZONE 500 MG PO TABS: 500.0000 mg | ORAL_TABLET | Freq: Four times a day (QID) | ORAL | 0 refills | Status: DC | PRN

## 2016-09-22 NOTE — Progress Notes (Signed)
   Subjective:    Patient ID: Adam Hahn, male    DOB: 1965-06-22, 51 y.o.   MRN: [id number]  Back Pain  This is a recurrent problem. The current episode started in the past 7 days. The pain is present in the lumbar spine. Treatments tried: Ibuprofen, Tens unit.   mon cut grass got a deep ache  Low back   Started tigeening up   Does give history of disc bulging. 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 Expect  Bilateral symmetric pain. No radiation the legs. Deep ache with spasms at times  Patient states no other concerns this visit.   Review of Systems  Musculoskeletal: Positive for back pain.       Objective:   Physical Exam Alert vitals stable, NAD. Blood pressure good on repeat. HEENT normal. Lungs clear. Heart regular rate and rhythm. Diffuse. Lumbar tenderness bilateral negative straight leg raise deep tendon reflexes intact       Assessment & Plan:  Impression lumbar strain/spasm discussed at great length. Plan hold off and x-rays rationale discussed ibuprofen 800 3 times a day. Add chlorzoxazone. Local measures discussed expect gradual resolution will be off work for a couple days

## 2016-11-22 ENCOUNTER — Ambulatory Visit: Payer: BLUE CROSS/BLUE SHIELD | Admitting: Family Medicine

## 2016-11-23 DIAGNOSIS — M546 Pain in thoracic spine: Secondary | ICD-10-CM | POA: Diagnosis not present

## 2016-11-23 DIAGNOSIS — S335XXA Sprain of ligaments of lumbar spine, initial encounter: Secondary | ICD-10-CM | POA: Diagnosis not present

## 2016-11-29 DIAGNOSIS — S335XXA Sprain of ligaments of lumbar spine, initial encounter: Secondary | ICD-10-CM | POA: Diagnosis not present

## 2016-11-29 DIAGNOSIS — M546 Pain in thoracic spine: Secondary | ICD-10-CM | POA: Diagnosis not present

## 2016-12-10 DIAGNOSIS — H469 Unspecified optic neuritis: Secondary | ICD-10-CM | POA: Diagnosis not present

## 2016-12-10 DIAGNOSIS — H47292 Other optic atrophy, left eye: Secondary | ICD-10-CM | POA: Diagnosis not present

## 2016-12-25 DIAGNOSIS — H469 Unspecified optic neuritis: Secondary | ICD-10-CM | POA: Diagnosis not present

## 2016-12-25 DIAGNOSIS — H4720 Unspecified optic atrophy: Secondary | ICD-10-CM | POA: Diagnosis not present

## 2016-12-25 DIAGNOSIS — G629 Polyneuropathy, unspecified: Secondary | ICD-10-CM | POA: Diagnosis not present

## 2016-12-25 DIAGNOSIS — Z8673 Personal history of transient ischemic attack (TIA), and cerebral infarction without residual deficits: Secondary | ICD-10-CM | POA: Diagnosis not present

## 2017-01-04 ENCOUNTER — Ambulatory Visit (INDEPENDENT_AMBULATORY_CARE_PROVIDER_SITE_OTHER): Payer: BLUE CROSS/BLUE SHIELD | Admitting: Family Medicine

## 2017-01-04 ENCOUNTER — Encounter: Payer: Self-pay | Admitting: Family Medicine

## 2017-01-04 VITALS — BP 120/92 | Ht 72.5 in | Wt 291.0 lb

## 2017-01-04 DIAGNOSIS — E119 Type 2 diabetes mellitus without complications: Secondary | ICD-10-CM | POA: Insufficient documentation

## 2017-01-04 DIAGNOSIS — R350 Frequency of micturition: Secondary | ICD-10-CM

## 2017-01-04 DIAGNOSIS — R739 Hyperglycemia, unspecified: Secondary | ICD-10-CM

## 2017-01-04 DIAGNOSIS — E1169 Type 2 diabetes mellitus with other specified complication: Secondary | ICD-10-CM | POA: Insufficient documentation

## 2017-01-04 LAB — POCT URINALYSIS DIPSTICK
GLUCOSE UA: 50
pH, UA: 5 (ref 5.0–8.0)

## 2017-01-04 LAB — POCT GLYCOSYLATED HEMOGLOBIN (HGB A1C): Hemoglobin A1C: 9.3

## 2017-01-04 LAB — POCT GLUCOSE (DEVICE FOR HOME USE): Glucose Fasting, POC: 318 mg/dL — AB (ref 70–99)

## 2017-01-04 MED ORDER — METFORMIN HCL 500 MG PO TABS: ORAL_TABLET | ORAL | 5 refills | Status: DC

## 2017-01-04 MED ORDER — BLOOD GLUCOSE MONITOR KIT: PACK | 2 refills | Status: DC

## 2017-01-04 NOTE — Progress Notes (Signed)
   Subjective:    Patient ID: Adam Hahn, male    DOB: 02/29/1966, 51 y.o.   MRN: [id number]  Urinary Tract Infection   This is a recurrent problem. The patient is experiencing no pain.  Patient states he thinks his prostatitis is acting up agin he has frequent urination.  Results for orders placed or performed in visit on 01/04/17  POCT urinalysis dipstick  Result Value Ref Range   Color, UA     Clarity, UA     Glucose, UA 50    Bilirubin, UA +    Ketones, UA     Spec Grav, UA >=1.030 (A) 1.010 - 1.025   Blood, UA     pH, UA 5.0 5.0 - 8.0   Protein, UA     Urobilinogen, UA  0.2 or 1.0 E.U./dL   Nitrite, UA     Leukocytes, UA  Negative  POCT glycosylated hemoglobin (Hb A1C)  Result Value Ref Range   Hemoglobin A1C 9.3   POCT Glucose (Device for Home Use)  Result Value Ref Range   Glucose Fasting, POC 318 (A) 70 - 99 mg/dL   POC Glucose  70 - 99 mg/dl         Pt had steroid injec in bk and knee  Positive history of glucose intolerance. No close family history diabetes. Patient admits fair compliance with diet.  Over the past week has noted increased urination. No increased thirst. No nocturia Review of Systems No headache, no major weight loss or weight gain, no chest pain no back pain abdominal pain no change in bowel habits complete ROS otherwise negative     Objective:   Physical Exam Alert and oriented, vitals reviewed and stable, NAD ENT-TM's and ext canals WNL bilat via otoscopic exam Soft palate, tonsils and post pharynx WNL via oropharyngeal exam Neck-symmetric, no masses; thyroid nonpalpable and nontender Pulmonary-no tachypnea or accessory muscle use; Clear without wheezes via auscultation Card--no abnrml murmurs, rhythm reg and rate WNL Carotid pulses symmetric, without bruits        Assessment & Plan:  Impression 1 type 2 diabetes new diagnosis discussed at great length. Many questions answered. Educated as to nature disease long-term  implications and interventions he can do in the short-term. Metformin prescribed. Side effects discussed. Hypoglycemia discussed. Tocometer prescribed. Check sugar daily. Educational session encourage. Educational information given. Recheck in one month.

## 2017-01-04 NOTE — Patient Instructions (Signed)
Diabetes Mellitus and Food It is important for you to manage your blood sugar (glucose) level. Your blood glucose level can be greatly affected by what you eat. Eating healthier foods in the appropriate amounts throughout the day at about the same time each day will help you control your blood glucose level. It can also help slow or prevent worsening of your diabetes mellitus. Healthy eating may even help you improve the level of your blood pressure and reach or maintain a healthy weight. General recommendations for healthful eating and cooking habits include:  Eating meals and snacks regularly. Avoid going long periods of time without eating to lose weight.  Eating a diet that consists mainly of plant-based foods, such as fruits, vegetables, nuts, legumes, and whole grains.  Using low-heat cooking methods, such as baking, instead of high-heat cooking methods, such as deep frying.  Work with your dietitian to make sure you understand how to use the Nutrition Facts information on food labels. How can food affect me? Carbohydrates Carbohydrates affect your blood glucose level more than any other type of food. Your dietitian will help you determine how many carbohydrates to eat at each meal and teach you how to count carbohydrates. Counting carbohydrates is important to keep your blood glucose at a healthy level, especially if you are using insulin or taking certain medicines for diabetes mellitus. Alcohol Alcohol can cause sudden decreases in blood glucose (hypoglycemia), especially if you use insulin or take certain medicines for diabetes mellitus. Hypoglycemia can be a life-threatening condition. Symptoms of hypoglycemia (sleepiness, dizziness, and disorientation) are similar to symptoms of having too much alcohol. If your health care provider has given you approval to drink alcohol, do so in moderation and use the following guidelines:  Women should not have more than one drink per day, and men  should not have more than two drinks per day. One drink is equal to: ? 12 oz of beer. ? 5 oz of wine. ? 1 oz of hard liquor.  Do not drink on an empty stomach.  Keep yourself hydrated. Have water, diet soda, or unsweetened iced tea.  Regular soda, juice, and other mixers might contain a lot of carbohydrates and should be counted.  What foods are not recommended? As you make food choices, it is important to remember that all foods are not the same. Some foods have fewer nutrients per serving than other foods, even though they might have the same number of calories or carbohydrates. It is difficult to get your body what it needs when you eat foods with fewer nutrients. Examples of foods that you should avoid that are high in calories and carbohydrates but low in nutrients include:  Trans fats (most processed foods list trans fats on the Nutrition Facts label).  Regular soda.  Juice.  Candy.  Sweets, such as cake, pie, doughnuts, and cookies.  Fried foods.  What foods can I eat? Eat nutrient-rich foods, which will nourish your body and keep you healthy. The food you should eat also will depend on several factors, including:  The calories you need.  The medicines you take.  Your weight.  Your blood glucose level.  Your blood pressure level.  Your cholesterol level.  You should eat a variety of foods, including:  Protein. ? Lean cuts of meat. ? Proteins low in saturated fats, such as fish, egg whites, and beans. Avoid processed meats.  Fruits and vegetables. ? Fruits and vegetables that may help control blood glucose levels, such as apples,   mangoes, and yams.  Dairy products. ? Choose fat-free or low-fat dairy products, such as milk, yogurt, and cheese.  Grains, bread, pasta, and rice. ? Choose whole grain products, such as multigrain bread, whole oats, and brown rice. These foods may help control blood pressure.  Fats. ? Foods containing healthful fats, such as  nuts, avocado, olive oil, canola oil, and fish.  Does everyone with diabetes mellitus have the same meal plan? Because every person with diabetes mellitus is different, there is not one meal plan that works for everyone. It is very important that you meet with a dietitian who will help you create a meal plan that is just right for you. This information is not intended to replace advice given to you by your health care provider. Make sure you discuss any questions you have with your health care provider. Document Released: 01/14/2005 Document Revised: 09/25/2015 Document Reviewed: 03/16/2013 Elsevier Interactive Patient Education  2017 [employer] Inc. Diabetes Mellitus and Exercise Exercising regularly is important for your overall health, especially when you have diabetes (diabetes mellitus). Exercising is not only about losing weight. It has many health benefits, such as increasing muscle strength and bone density and reducing body fat and stress. This leads to improved fitness, flexibility, and endurance, all of which result in better overall health. Exercise has additional benefits for people with diabetes, including:  Reducing appetite.  Helping to lower and control blood glucose.  Lowering blood pressure.  Helping to control amounts of fatty substances (lipids) in the blood, such as cholesterol and triglycerides.  Helping the body to respond better to insulin (improving insulin sensitivity).  Reducing how much insulin the body needs.  Decreasing the risk for heart disease by: ? Lowering cholesterol and triglyceride levels. ? Increasing the levels of good cholesterol. ? Lowering blood glucose levels.  What is my activity plan? Your health care provider or certified diabetes educator can help you make a plan for the type and frequency of exercise (activity plan) that works for you. Make sure that you:  Do at least 150 minutes of moderate-intensity or vigorous-intensity exercise each  week. This could be brisk walking, biking, or water aerobics. ? Do stretching and strength exercises, such as yoga or weightlifting, at least 2 times a week. ? Spread out your activity over at least 3 days of the week.  Get some form of physical activity every day. ? Do not go more than 2 days in a row without some kind of physical activity. ? Avoid being inactive for more than 90 minutes at a time. Take frequent breaks to walk or stretch.  Choose a type of exercise or activity that you enjoy, and set realistic goals.  Start slowly, and gradually increase the intensity of your exercise over time.  What do I need to know about managing my diabetes?  Check your blood glucose before and after exercising. ? If your blood glucose is higher than 240 mg/dL (13.3 mmol/L) before you exercise, check your urine for ketones. If you have ketones in your urine, do not exercise until your blood glucose returns to normal.  Know the symptoms of low blood glucose (hypoglycemia) and how to treat it. Your risk for hypoglycemia increases during and after exercise. Common symptoms of hypoglycemia can include: ? Hunger. ? Anxiety. ? Sweating and feeling clammy. ? Confusion. ? Dizziness or feeling light-headed. ? Increased heart rate or palpitations. ? Blurry vision. ? Tingling or numbness around the mouth, lips, or tongue. ? Tremors or shakes. ?   Irritability.  Keep a rapid-acting carbohydrate snack available before, during, and after exercise to help prevent or treat hypoglycemia.  Avoid injecting insulin into areas of the body that are going to be exercised. For example, avoid injecting insulin into: ? The arms, when playing tennis. ? The legs, when jogging.  Keep records of your exercise habits. Doing this can help you and your health care provider adjust your diabetes management plan as needed. Write down: ? Food that you eat before and after you exercise. ? Blood glucose levels before and after you  exercise. ? The type and amount of exercise you have done. ? When your insulin is expected to peak, if you use insulin. Avoid exercising at times when your insulin is peaking.  When you start a new exercise or activity, work with your health care provider to make sure the activity is safe for you, and to adjust your insulin, medicines, or food intake as needed.  Drink plenty of water while you exercise to prevent dehydration or heat stroke. Drink enough fluid to keep your urine clear or pale yellow. This information is not intended to replace advice given to you by your health care provider. Make sure you discuss any questions you have with your health care provider. Document Released: 07/10/2003 Document Revised: 11/07/2015 Document Reviewed: 09/29/2015 Elsevier Interactive Patient Education  2018 [employer]vier Inc.  

## 2017-01-04 NOTE — Addendum Note (Signed)
Addended by: Carmelina Noun on: 01/04/2017 02:15 PM   Modules accepted: Orders

## 2017-01-13 ENCOUNTER — Encounter: Payer: BLUE CROSS/BLUE SHIELD | Attending: Family Medicine | Admitting: Registered"

## 2017-01-13 ENCOUNTER — Encounter: Payer: Self-pay | Admitting: Registered"

## 2017-01-13 DIAGNOSIS — E119 Type 2 diabetes mellitus without complications: Secondary | ICD-10-CM | POA: Diagnosis not present

## 2017-01-13 DIAGNOSIS — Z713 Dietary counseling and surveillance: Secondary | ICD-10-CM | POA: Diagnosis not present

## 2017-01-13 HISTORY — DX: Type 2 diabetes mellitus without complications: E11.9

## 2017-01-13 NOTE — Patient Instructions (Addendum)
Plan:  Aim for 3-5 Carb Choices per meal (45-75 grams)  Aim for 0-2 Carbs per snack if hungry  Include protein your meals and snacks Consider reading food labels for Total Carbohydrate and Sat Fat Grams of foods Continue your activity level as tolerated Consider checking BG at alternate times per day as directed by MD (Targets: Fasting 30-130 and 2 hrs after meals less than 180) Consider taking medication as directed by MD  Consider using POM or diet cranberry juice instead of cranberry juice and in small amounts. To help get protein with cereal, you may want to try [employer] milk. If you start having side effects of sore muscle, avoid grapefruit due to potential interactions with drugs.  [email or web address]-[email or web address]

## 2017-01-13 NOTE — Progress Notes (Signed)
Diabetes Self-Management Education  Visit Type: First/Initial  Appt. Start Time: 0900 Appt. End Time: 1030  01/13/2017  [name], identified by name and date of birth, is a 51 y.o. male with a diagnosis of Diabetes: Type 2.   ASSESSMENT This patient is accompanied in the office by his spouse. Pt states since he was diagnosed he has made several changes such as cut out bread, beers, sweet tea and eating more salads and usually has grilled over fried meat.   Pt states he can eat cooked tomatoes but not raw.  Pt states reports some mornings he drinks a mixture of water, honey, apple cider vinegar, lemon juice, cinnamon to help control blood sugar and claims it works. RD advised that this mixture may not be diluted enough to avoid damage to teeth & esophagus.     Diabetes Self-Management Education - 01/13/17 0858      Visit Information   Visit Type First/Initial     Initial Visit   Diabetes Type Type 2   Are you taking your medications as prescribed? Yes  2,000 mg metformin   Date Diagnosed 01/2017     Health Coping   How would you rate your overall health? Good     Psychosocial Assessment   Patient Belief/Attitude about Diabetes Motivated to manage diabetes   How often do you need to have someone help you when you read instructions, pamphlets, or other written materials from your doctor or pharmacy? 1 - Never   What is the last grade level you completed in school? 16     Complications   Last HgB A1C per patient/outside source 9.3 %  per chart 01/2017   How often do you check your blood sugar? 1-2 times/day   Fasting Blood glucose range (mg/dL) 70-129  129-225   Number of hypoglycemic episodes per month 0   Number of hyperglycemic episodes per week 2   Can you tell when your blood sugar is high? Yes  was urinating a lot, didn't know had DM   What do you do if your blood sugar is high? --   Have you had a dilated eye exam in the past 12 months? Yes   Have you had a  dental exam in the past 12 months? Yes   Are you checking your feet? No     Dietary Intake   Breakfast 2 eggs, 2 beef sausage   Snack (morning) almonds OR PB crackers   Lunch steamed veggie, meat, ~1/3 c noodles   Snack (afternoon) none   Dinner none   Snack (evening) pineapple   Beverage(s) water, unsweet tea, diet green tea     Exercise   Exercise Type Light (walking / raking leaves);Other (comment)  golfing 2x week   How many days per week to you exercise? 5   How many minutes per day do you exercise? 45   Total minutes per week of exercise 225     Patient Education   Previous Diabetes Education No   Disease state  Definition of diabetes, type 1 and 2, and the diagnosis of diabetes;Factors that contribute to the development of diabetes   Nutrition management  Role of diet in the treatment of diabetes and the relationship between the three main macronutrients and blood glucose level;Food label reading, portion sizes and measuring food.;Carbohydrate counting   Physical activity and exercise  Role of exercise on diabetes management, blood pressure control and cardiac health.   Monitoring Identified appropriate SMBG and/or A1C goals.  Acute complications Taught treatment of hypoglycemia - the 15 rule.     Individualized Goals (developed by patient)   Nutrition Follow meal plan discussed   Monitoring  test my blood glucose as discussed     Outcomes   Expected Outcomes Demonstrated interest in learning. Expect positive outcomes   Future DMSE 4-6 wks   Program Status Completed    Individualized Plan for Diabetes Self-Management Training:   Learning Objective:  Patient will have a greater understanding of diabetes self-management. Patient education plan is to attend individual and/or group sessions per assessed needs and concerns.  Patient Instructions  Plan:  Aim for 3-5 Carb Choices per meal (45-75 grams)  Aim for 0-2 Carbs per snack if hungry  Include protein your meals  and snacks Consider reading food labels for Total Carbohydrate and Sat Fat Grams of foods Continue your activity level as tolerated Consider checking BG at alternate times per day as directed by MD (Targets: Fasting 30-130 and 2 hrs after meals less than 180) Consider taking medication as directed by MD  Consider using POM or diet cranberry juice instead of cranberry juice and in small amounts. To help get protein with cereal, you may want to try [employer] milk. If you start having side effects of sore muscle, avoid grapefruit due to potential interactions with drugs.  http://diabetes.ada-ksw.com/publication/?i=472991 link to diabetes education book.  Expected Outcomes:  Demonstrated interest in learning. Expect positive outcomes  Education material provided: Living Well with Diabetes, A1C conversion sheet, My Plate, Carbohydrate counting sheet and No sodium seasonings, fish guide, Diabetes.org sign up instructions  If problems or questions, patient to contact team via:  Phone and MyChart  Future DSME appointment: 4-6 wks

## 2017-01-14 ENCOUNTER — Ambulatory Visit: Payer: BLUE CROSS/BLUE SHIELD | Admitting: Registered"

## 2017-02-01 ENCOUNTER — Encounter: Payer: Self-pay | Admitting: Family Medicine

## 2017-02-01 ENCOUNTER — Ambulatory Visit (INDEPENDENT_AMBULATORY_CARE_PROVIDER_SITE_OTHER): Payer: BLUE CROSS/BLUE SHIELD | Admitting: Family Medicine

## 2017-02-01 VITALS — BP 122/80 | Ht 72.5 in | Wt 285.2 lb

## 2017-02-01 DIAGNOSIS — E119 Type 2 diabetes mellitus without complications: Secondary | ICD-10-CM | POA: Diagnosis not present

## 2017-02-01 MED ORDER — METFORMIN HCL 1000 MG PO TABS: 1000.0000 mg | ORAL_TABLET | Freq: Two times a day (BID) | ORAL | 5 refills | Status: DC

## 2017-02-01 NOTE — Progress Notes (Signed)
   Subjective:    Patient ID: Adam Hahn, male    DOB: 12-Mar-1966, 51 y.o.   MRN: [id number]  Diabetes  He presents for his follow-up diabetic visit. He has type 2 diabetes mellitus. He has had a previous visit with a dietitian.   loose bowels stil prsisiting  Even with Lucrezia Starch still loose  Still handling meds well other than lose stools   Adjusted diet substantially  Feels the metformin is causing excessive loose stools. Would like to long-acting form. Thinks it would be easier on according to the commercials. Patient states no concerns this visit.    Review of Systems No headache, no major weight loss or weight gain, no chest pain no back pain abdominal pain no change in bowel habits complete ROS otherwise negative     Objective:   Physical Exam Alert vitals stable, NAD. Blood pressure good on repeat. HEENT normal. Lungs clear. Heart regular rate and rhythm.        Assessment & Plan:  Impression type 2 diabetes control much improved on current regimen. Switcper patient request diet exercise discuss follow-up in a couple months A1c then

## 2017-02-15 ENCOUNTER — Encounter: Payer: BLUE CROSS/BLUE SHIELD | Attending: Family Medicine | Admitting: Registered"

## 2017-02-15 DIAGNOSIS — E119 Type 2 diabetes mellitus without complications: Secondary | ICD-10-CM | POA: Diagnosis not present

## 2017-02-15 DIAGNOSIS — Z713 Dietary counseling and surveillance: Secondary | ICD-10-CM | POA: Insufficient documentation

## 2017-02-15 NOTE — Progress Notes (Signed)
Diabetes Self-Management Education  Visit Type:  follow-up  Appt. Start Time: 1000 Appt. End Time: 2778  02/15/2017  Adam Hahn, identified by name and date of birth, is a 51 y.o. male with a diagnosis of Diabetes:  .   ASSESSMENT Patient brought in BG log with range of 78-120 for FBG & 2hrs post meals. Pt states one reading outside this, 149, was after eating pizza at class reunion. Pt states he stopped eating pineapple and drinking juice. Pt reports his goal is to be on as little medication as possible. Pt states he is exercising more.  Pt states GI issues resolved when switched to metformin ER.   24-hr recall: Breakfast: eggs, 1 link of sausage Lunch: burger Dinner:Sutffed chicken, spinach OR grilled chicken, salad  Snack: graham crackers & PB OR puffed rice, nuts Beverages: water, diet green tea  Individualized Plan for Diabetes Self-Management Training:   Learning Objective:  Patient will have a greater understanding of diabetes self-management. Patient education plan is to attend individual and/or group sessions per assessed needs and concerns.  Education material provided: none  If problems or questions, patient to contact team via:  Phone and MyChart  Future DSME appointment:  PRN

## 2017-04-05 DIAGNOSIS — L239 Allergic contact dermatitis, unspecified cause: Secondary | ICD-10-CM | POA: Diagnosis not present

## 2017-04-21 DIAGNOSIS — H469 Unspecified optic neuritis: Secondary | ICD-10-CM | POA: Diagnosis not present

## 2017-04-22 ENCOUNTER — Telehealth: Payer: Self-pay | Admitting: Family Medicine

## 2017-04-22 DIAGNOSIS — I10 Essential (primary) hypertension: Secondary | ICD-10-CM

## 2017-04-22 DIAGNOSIS — Z79899 Other long term (current) drug therapy: Secondary | ICD-10-CM

## 2017-04-22 DIAGNOSIS — Z125 Encounter for screening for malignant neoplasm of prostate: Secondary | ICD-10-CM

## 2017-04-22 DIAGNOSIS — Z13220 Encounter for screening for lipoid disorders: Secondary | ICD-10-CM

## 2017-04-22 DIAGNOSIS — E119 Type 2 diabetes mellitus without complications: Secondary | ICD-10-CM

## 2017-04-22 NOTE — Telephone Encounter (Signed)
Blood work ordered in Epic. Patient notified. 

## 2017-04-22 NOTE — Telephone Encounter (Signed)
Patient had labs 05/2016- Lipid, Liver, Met 7, HgbA1c and PSA

## 2017-04-22 NOTE — Telephone Encounter (Signed)
Patient needs lab work paper .[phone]

## 2017-04-22 NOTE — Telephone Encounter (Signed)
Rep same 

## 2017-04-29 DIAGNOSIS — Z79899 Other long term (current) drug therapy: Secondary | ICD-10-CM | POA: Diagnosis not present

## 2017-04-29 DIAGNOSIS — Z125 Encounter for screening for malignant neoplasm of prostate: Secondary | ICD-10-CM | POA: Diagnosis not present

## 2017-04-29 DIAGNOSIS — E119 Type 2 diabetes mellitus without complications: Secondary | ICD-10-CM | POA: Diagnosis not present

## 2017-04-29 DIAGNOSIS — Z13220 Encounter for screening for lipoid disorders: Secondary | ICD-10-CM | POA: Diagnosis not present

## 2017-04-29 DIAGNOSIS — I10 Essential (primary) hypertension: Secondary | ICD-10-CM | POA: Diagnosis not present

## 2017-04-30 LAB — HEPATIC FUNCTION PANEL
ALT: 31 IU/L (ref 0–44)
AST: 23 IU/L (ref 0–40)
Albumin: 4.9 g/dL (ref 3.5–5.5)
Alkaline Phosphatase: 54 IU/L (ref 39–117)
BILIRUBIN TOTAL: 0.5 mg/dL (ref 0.0–1.2)
BILIRUBIN, DIRECT: 0.16 mg/dL (ref 0.00–0.40)
Total Protein: 7.6 g/dL (ref 6.0–8.5)

## 2017-04-30 LAB — HEMOGLOBIN A1C
Est. average glucose Bld gHb Est-mCnc: 123 mg/dL
Hgb A1c MFr Bld: 5.9 % — ABNORMAL HIGH (ref 4.8–5.6)

## 2017-04-30 LAB — BASIC METABOLIC PANEL
BUN / CREAT RATIO: 11 (ref 9–20)
BUN: 14 mg/dL (ref 6–24)
CHLORIDE: 101 mmol/L (ref 96–106)
CO2: 24 mmol/L (ref 20–29)
Calcium: 10.1 mg/dL (ref 8.7–10.2)
Creatinine, Ser: 1.22 mg/dL (ref 0.76–1.27)
GFR calc non Af Amer: 69 mL/min/{1.73_m2} (ref >59)
GFR, EST AFRICAN AMERICAN: 80 mL/min/{1.73_m2} (ref >59)
Glucose: 97 mg/dL (ref 65–99)
Potassium: 4.2 mmol/L (ref 3.5–5.2)
Sodium: 142 mmol/L (ref 134–144)

## 2017-04-30 LAB — LIPID PANEL
CHOLESTEROL TOTAL: 140 mg/dL (ref 100–199)
Chol/HDL Ratio: 3 ratio (ref 0.0–5.0)
HDL: 47 mg/dL (ref >39)
LDL CALC: 79 mg/dL (ref 0–99)
Triglycerides: 69 mg/dL (ref 0–149)
VLDL CHOLESTEROL CAL: 14 mg/dL (ref 5–40)

## 2017-04-30 LAB — PSA: Prostate Specific Ag, Serum: 0.3 ng/mL (ref 0.0–4.0)

## 2017-05-01 ENCOUNTER — Encounter: Payer: Self-pay | Admitting: Family Medicine

## 2017-05-25 ENCOUNTER — Encounter: Payer: BLUE CROSS/BLUE SHIELD | Admitting: Family Medicine

## 2017-05-30 ENCOUNTER — Encounter: Payer: Self-pay | Admitting: Family Medicine

## 2017-05-30 ENCOUNTER — Ambulatory Visit (INDEPENDENT_AMBULATORY_CARE_PROVIDER_SITE_OTHER): Payer: BLUE CROSS/BLUE SHIELD | Admitting: Family Medicine

## 2017-05-30 VITALS — BP 122/88 | Ht 72.5 in | Wt 279.0 lb

## 2017-05-30 DIAGNOSIS — I10 Essential (primary) hypertension: Secondary | ICD-10-CM

## 2017-05-30 DIAGNOSIS — Z0000 Encounter for general adult medical examination without abnormal findings: Secondary | ICD-10-CM | POA: Diagnosis not present

## 2017-05-30 DIAGNOSIS — E119 Type 2 diabetes mellitus without complications: Secondary | ICD-10-CM

## 2017-05-30 MED ORDER — METFORMIN HCL 1000 MG PO TABS: 1000.0000 mg | ORAL_TABLET | Freq: Two times a day (BID) | ORAL | 5 refills | Status: DC

## 2017-05-30 MED ORDER — FEXOFENADINE HCL 180 MG PO TABS: 180.0000 mg | ORAL_TABLET | Freq: Every day | ORAL | 5 refills | Status: DC

## 2017-05-30 MED ORDER — VERAPAMIL HCL ER 240 MG PO TBCR: 240.0000 mg | EXTENDED_RELEASE_TABLET | Freq: Every day | ORAL | 5 refills | Status: DC

## 2017-05-30 NOTE — Progress Notes (Signed)
Subjective:    Patient ID: Adam Hahn, male    DOB: Mar 27, 1966, 52 y.o.   MRN: [id number]  HPI The patient comes in today for a wellness visit.    A review of their health history was completed.  A review of medications was also completed.  Any needed refills; BG test strips needs [employer] precision xtra  Eating habits: Good  Falls/  MVA accidents in past few months: None  Regular exercise: Yes  Specialist pt sees on regular basis: None  Preventative health issues were discussed.   Additional concerns: None  Results for orders placed or performed in visit on 04/22/17  Lipid panel  Result Value Ref Range   Cholesterol, Total 140 100 - 199 mg/dL   Triglycerides 69 0 - 149 mg/dL   HDL 47 >39 mg/dL   VLDL Cholesterol Cal 14 5 - 40 mg/dL   LDL Calculated 79 0 - 99 mg/dL   Chol/HDL Ratio 3.0 0.0 - 5.0 ratio  Hepatic function panel  Result Value Ref Range   Total Protein 7.6 6.0 - 8.5 g/dL   Albumin 4.9 3.5 - 5.5 g/dL   Bilirubin Total 0.5 0.0 - 1.2 mg/dL   Bilirubin, Direct 0.16 0.00 - 0.40 mg/dL   Alkaline Phosphatase 54 39 - 117 IU/L   AST 23 0 - 40 IU/L   ALT 31 0 - 44 IU/L  Basic metabolic panel  Result Value Ref Range   Glucose 97 65 - 99 mg/dL   BUN 14 6 - 24 mg/dL   Creatinine, Ser 1.22 0.76 - 1.27 mg/dL   GFR calc non Af Amer 69 >59 mL/min/1.73   GFR calc Af Amer 80 >59 mL/min/1.73   BUN/Creatinine Ratio 11 9 - 20   Sodium 142 134 - 144 mmol/L   Potassium 4.2 3.5 - 5.2 mmol/L   Chloride 101 96 - 106 mmol/L   CO2 24 20 - 29 mmol/L   Calcium 10.1 8.7 - 10.2 mg/dL  Hemoglobin A1c  Result Value Ref Range   Hgb A1c MFr Bld 5.9 (H) 4.8 - 5.6 %   Est. average glucose Bld gHb Est-mCnc 123 mg/dL  PSA  Result Value Ref Range   Prostate Specific Ag, Serum 0.3 0.0 - 4.0 ng/mL    Seeing eye doc, no obv retinopthy      Patient claims compliance with diabetes medication. No obvious side effects. Reports no substantial low sugar spells. Most numbers  are generally in good range when checked fasting. Generally does not miss a dose of medication. Watching diabetic diet closely Blood pressure medicine and blood pressure levels reviewed today with patient. Compliant with blood pressure medicine. States does not miss a dose. No obvious side effects. Blood pressure generally good when checked elsewhere. Watching salt intake.  Pt has diferent meter, bp good elsewhere  Review of Systems  Constitutional: Negative for activity change, appetite change and fever.  HENT: Negative for congestion and rhinorrhea.   Eyes: Negative for discharge.  Respiratory: Negative for cough and wheezing.   Cardiovascular: Negative for chest pain.  Gastrointestinal: Negative for abdominal pain, blood in stool and vomiting.  Genitourinary: Negative for difficulty urinating and frequency.  Musculoskeletal: Negative for neck pain.  Skin: Negative for rash.  Allergic/Immunologic: Negative for environmental allergies and food allergies.  Neurological: Negative for weakness and headaches.  Psychiatric/Behavioral: Negative for agitation.  All other systems reviewed and are negative.      Objective:   Physical Exam  Constitutional: He appears well-developed  and well-nourished.  HENT:  Head: Normocephalic and atraumatic.  Right Ear: External ear normal.  Left Ear: External ear normal.  Nose: Nose normal.  Mouth/Throat: Oropharynx is clear and moist.  Eyes: Right eye exhibits no discharge. Left eye exhibits no discharge. No scleral icterus.  Neck: Normal range of motion. Neck supple. No thyromegaly present.  Cardiovascular: Normal rate, regular rhythm and normal heart sounds.  No murmur heard. Pulmonary/Chest: Effort normal and breath sounds normal. No respiratory distress. He has no wheezes.  Abdominal: Soft. Bowel sounds are normal. He exhibits no distension and no mass. There is no tenderness.  Genitourinary: Penis normal.  Musculoskeletal: Normal range of  motion. He exhibits no edema.  Lymphadenopathy:    He has no cervical adenopathy.  Neurological: He is alert. He exhibits normal muscle tone. Coordination normal.  Skin: Skin is warm and dry. No erythema.  Psychiatric: He has a normal mood and affect. His behavior is normal. Judgment normal.  Vitals reviewed.         Assessment & Plan:  Impression wellness exam.  Diet discussed.  Exercise discussed.  Up-to-date on flu shot.  Blood work discussed  Hypertension good control discussed to maintain same meds  Type 2 diabetes.  A1c much better.  Discussed to maintain same

## 2017-06-03 DIAGNOSIS — L239 Allergic contact dermatitis, unspecified cause: Secondary | ICD-10-CM | POA: Diagnosis not present

## 2017-06-13 DIAGNOSIS — L239 Allergic contact dermatitis, unspecified cause: Secondary | ICD-10-CM | POA: Diagnosis not present

## 2017-06-15 DIAGNOSIS — L239 Allergic contact dermatitis, unspecified cause: Secondary | ICD-10-CM | POA: Diagnosis not present

## 2017-06-17 ENCOUNTER — Ambulatory Visit: Payer: BLUE CROSS/BLUE SHIELD | Admitting: Podiatry

## 2017-06-17 ENCOUNTER — Encounter: Payer: Self-pay | Admitting: Podiatry

## 2017-06-17 VITALS — BP 160/53 | HR 73

## 2017-06-17 DIAGNOSIS — M2011 Hallux valgus (acquired), right foot: Secondary | ICD-10-CM

## 2017-06-17 DIAGNOSIS — L239 Allergic contact dermatitis, unspecified cause: Secondary | ICD-10-CM | POA: Diagnosis not present

## 2017-06-17 DIAGNOSIS — E119 Type 2 diabetes mellitus without complications: Secondary | ICD-10-CM | POA: Diagnosis not present

## 2017-06-17 DIAGNOSIS — M2012 Hallux valgus (acquired), left foot: Secondary | ICD-10-CM

## 2017-06-17 NOTE — Progress Notes (Signed)
   Subjective:    Patient ID: Adam Hahn, male    DOB: Mar 09, 1966, 52 y.o.   MRN: [id number]  HPI this patient presents to the office stating that he has recently been diagnosed as a diabetic.  He was told to make an appointment with a podiatrist for evaluation and treatment of his feet. He presents the office stating he has no pain or discomfort in both of his feet.  He presents to the office for diabetic foot exam.    Review of Systems  All other systems reviewed and are negative.      Objective:   Physical Exam General Appearance  Alert, conversant and in no acute stress.  Vascular  Dorsalis pedis and posterior pulses are palpable  bilaterally.  Capillary return is within normal limits  bilaterally. Temperature is within normal limits  Bilaterally.  Neurologic  Senn-Weinstein monofilament wire test within normal limits  bilaterally. Muscle power within normal limits bilaterally.  Nails Normal nails noted with no evidence of bacterial or fungal infection.  Orthopedic  No limitations of motion of motion feet bilaterally.  No crepitus or effusions noted.  No bony pathology or digital deformities noted. HAV  B/L.  Plantar flexed fifth metatarsal heads  B/L.  Skin  normotropic skin with no porokeratosis noted bilaterally.  No signs of infections or ulcers noted.          Assessment & Plan:  Diabetes with no complications.  HAV  B/L   IE  Diabetic foot exam  atient has no pathology related  Vascular, neurologic or muscle power status.  RTC 1 year for his annual foot exam.   Gardiner Barefoot DPM

## 2017-07-15 DIAGNOSIS — L239 Allergic contact dermatitis, unspecified cause: Secondary | ICD-10-CM | POA: Diagnosis not present

## 2017-07-15 DIAGNOSIS — L738 Other specified follicular disorders: Secondary | ICD-10-CM | POA: Diagnosis not present

## 2017-09-12 ENCOUNTER — Ambulatory Visit: Payer: BLUE CROSS/BLUE SHIELD | Admitting: Family Medicine

## 2017-09-12 ENCOUNTER — Encounter: Payer: Self-pay | Admitting: Family Medicine

## 2017-09-12 VITALS — BP 130/92 | Ht 72.5 in | Wt 283.1 lb

## 2017-09-12 DIAGNOSIS — E119 Type 2 diabetes mellitus without complications: Secondary | ICD-10-CM | POA: Diagnosis not present

## 2017-09-12 DIAGNOSIS — L739 Follicular disorder, unspecified: Secondary | ICD-10-CM | POA: Diagnosis not present

## 2017-09-12 LAB — POCT GLYCOSYLATED HEMOGLOBIN (HGB A1C): Hemoglobin A1C: 5.3

## 2017-09-12 MED ORDER — SULFAMETHOXAZOLE-TRIMETHOPRIM 800-160 MG PO TABS: 1.0000 | ORAL_TABLET | Freq: Two times a day (BID) | ORAL | 0 refills | Status: DC

## 2017-09-12 MED ORDER — ERYTHROMYCIN 2 % EX GEL: CUTANEOUS | 3 refills | Status: DC

## 2017-09-12 NOTE — Progress Notes (Signed)
   Subjective:    Patient ID: Adam Hahn, male    DOB: 07-05-1965, 52 y.o.   MRN: [id number]  HPI Patient is here today to follow up on Dm. He is currently taking Metformin 500 mg Two po BID.He states he eats healthy and exercises he is not seeing any specialist.  Results for orders placed or performed in visit on 04/22/17  Lipid panel  Result Value Ref Range   Cholesterol, Total 140 100 - 199 mg/dL   Triglycerides 69 0 - 149 mg/dL   HDL 47 >39 mg/dL   VLDL Cholesterol Cal 14 5 - 40 mg/dL   LDL Calculated 79 0 - 99 mg/dL   Chol/HDL Ratio 3.0 0.0 - 5.0 ratio  Hepatic function panel  Result Value Ref Range   Total Protein 7.6 6.0 - 8.5 g/dL   Albumin 4.9 3.5 - 5.5 g/dL   Bilirubin Total 0.5 0.0 - 1.2 mg/dL   Bilirubin, Direct 0.16 0.00 - 0.40 mg/dL   Alkaline Phosphatase 54 39 - 117 IU/L   AST 23 0 - 40 IU/L   ALT 31 0 - 44 IU/L  Basic metabolic panel  Result Value Ref Range   Glucose 97 65 - 99 mg/dL   BUN 14 6 - 24 mg/dL   Creatinine, Ser 1.22 0.76 - 1.27 mg/dL   GFR calc non Af Amer 69 >59 mL/min/1.73   GFR calc Af Amer 80 >59 mL/min/1.73   BUN/Creatinine Ratio 11 9 - 20   Sodium 142 134 - 144 mmol/L   Potassium 4.2 3.5 - 5.2 mmol/L   Chloride 101 96 - 106 mmol/L   CO2 24 20 - 29 mmol/L   Calcium 10.1 8.7 - 10.2 mg/dL  Hemoglobin A1c  Result Value Ref Range   Hgb A1c MFr Bld 5.9 (H) 4.8 - 5.6 %   Est. average glucose Bld gHb Est-mCnc 123 mg/dL  PSA  Result Value Ref Range   Prostate Specific Ag, Serum 0.3 0.0 - 4.0 ng/mL   Results for orders placed or performed in visit on 09/12/17  POCT glycosylated hemoglobin (Hb A1C)  Result Value Ref Range   Hemoglobin A1C 5.3    pt has had rash for about two months  Went to the nurse practitioner at the dermatologist  No hx of similar eruption before    Glucose s good   Review of Systems No headache, no major weight loss or weight gain, no chest pain no back pain abdominal pain no change in bowel habits complete  ROS otherwise negative     Objective:   Physical Exam  Alert vitals stable, NAD. Blood pressure good on repeat. HEENT normal. Lungs clear. Heart regular rate and rhythm. Folliculitis irruption anterior chest      Assessment & Plan:  Impression as above.  Patient is taking doxycycline via mid-level provider at dermatologist.  Recommend Bactrim DS twice daily 21 days.  Also topical erythromycin gel rationale discussed for this will take care of it if not need to get back to hopefully the dermatologist/of note A1c is a good

## 2017-10-05 ENCOUNTER — Ambulatory Visit: Payer: BLUE CROSS/BLUE SHIELD | Admitting: Family Medicine

## 2017-10-05 ENCOUNTER — Encounter: Payer: Self-pay | Admitting: Family Medicine

## 2017-10-05 VITALS — Ht 72.5 in | Wt 283.0 lb

## 2017-10-05 DIAGNOSIS — M79604 Pain in right leg: Secondary | ICD-10-CM | POA: Diagnosis not present

## 2017-10-05 DIAGNOSIS — S76311A Strain of muscle, fascia and tendon of the posterior muscle group at thigh level, right thigh, initial encounter: Secondary | ICD-10-CM

## 2017-10-05 NOTE — Progress Notes (Signed)
   Subjective:    Patient ID: EUFEMIO STRAHM, male    DOB: April 30, 1966, 52 y.o.   MRN: [id number]  HPI  Patient was walking and slipped and felt tearing sensation in right hamstring.  Patient slipped He felt a pulling sensation in his right hamstring.  Relates pain and discomfort hurts with movement.  Denies knee pain denies instability Denies any ankle pain Review of Systems Please see above    Objective:   Physical Exam Low back nontender good range of motion left leg knee ligaments are normal right knee ligaments are normal ankles normal calf normal has significant right hamstring tenderness and tightness       Assessment & Plan:  Physical therapy recommended Patient will have progressive troubles with walking over the next few days Anti-inflammatory for the next few days Patient does not want any pain medicine Cold compresses frequently over the next few days Follow-up if progressive troubles. X-rays MRI not indicated Will bruise up significantly Keeping regular activity will minimize risk of blood clots

## 2017-10-06 ENCOUNTER — Other Ambulatory Visit: Payer: Self-pay

## 2017-10-06 ENCOUNTER — Ambulatory Visit (HOSPITAL_COMMUNITY): Payer: BLUE CROSS/BLUE SHIELD | Attending: Family Medicine | Admitting: Physical Therapy

## 2017-10-06 ENCOUNTER — Encounter (INDEPENDENT_AMBULATORY_CARE_PROVIDER_SITE_OTHER): Payer: Self-pay

## 2017-10-06 DIAGNOSIS — R262 Difficulty in walking, not elsewhere classified: Secondary | ICD-10-CM | POA: Insufficient documentation

## 2017-10-06 NOTE — Therapy (Addendum)
[facility] [address] [place], [place], [address] Phone: [phone]   Fax:  [phone]  Physical Therapy Evaluation  Patient Details  Name: Adam Hahn MRN: [id number] Date of Birth: 02-15-1966 Referring Provider: Sallee Lange    Encounter Date: 10/06/2017  PT End of Session - 10/06/17 1448    Visit Number  1    Number of Visits  4  Visit limit 60    Date for PT Re-Evaluation  10/20/17    Authorization Type  BCBS    PT Start Time  1300    PT Stop Time  1340    PT Time Calculation (min)  40 min    Equipment Utilized During Treatment  Gait belt    Activity Tolerance  Patient tolerated treatment well    Behavior During Therapy  [facility] for tasks assessed/performed       Past Medical History:  Diagnosis Date  . Allergic rhinitis   . HTN (hypertension)   . IFG (impaired fasting glucose)   . Obesity   . Sleep apnea    CPAP    Past Surgical History:  Procedure Laterality Date  . CHOLECYSTECTOMY    . KNEE SURGERY     x 5    There were no vitals filed for this visit.   Subjective Assessment - 10/06/17 1300    Subjective  [name] slipped on a wet floor yesterday.  At the time his pain was a 10/10 it is an 8 right now     Limitations  Lifting;Standing;House hold activities    How long can you sit comfortably?  no problem     How long can you stand comfortably?  able to stand but his leg feels heavy     How long can you walk comfortably?  immediately begins to hurt he has not done much walking.     Patient Stated Goals  less pain     Currently in Pain?  Yes    Pain Score  8     Pain Location  Leg    Pain Orientation  Right    Pain Descriptors / Indicators  Aching;Burning    Pain Type  Acute pain    Pain Onset  Yesterday    Pain Frequency  Constant various intensity     Aggravating Factors   walking, bending     Pain Relieving Factors  ice     Effect of Pain on Daily Activities  limits          [facility] PT  Assessment - 10/06/17 0001      Assessment   Medical Diagnosis  Rt hamstring strain    Referring Provider  Nicki Reaper [place]     Onset Date/Surgical Date  10/05/17    Prior Therapy  none      Precautions   Precautions  None      Restrictions   Weight Bearing Restrictions  No      Balance Screen   Has the patient fallen in the past 6 months  Yes    How many times?  1    Has the patient had a decrease in activity level because of a fear of falling?   Yes    Is the patient reluctant to leave their home because of a fear of falling?   No      Home Film/video editor residence      Prior Function   Level of Independence  Independent    Vocation  Full time employment    Software engineer     Leisure  walk,       Cognition   Overall Cognitive Status  Within Functional Limits for tasks assessed      Observation/Other Assessments   Focus on Therapeutic Outcomes (FOTO)   50      Functional Tests   Functional tests  Single leg stance      Single Leg Stance   Comments  Rt:  60;  Lt 60       ROM / Strength   AROM / PROM / Strength  AROM;Strength      AROM   AROM Assessment Site  Knee    Right/Left Knee  Left    Left Knee Extension  0    Left Knee Flexion  130      Strength   Strength Assessment Site  Knee    Right/Left Knee  Right    Right Knee Flexion  3/5    Right Knee Extension  5/5      Flexibility   Soft Tissue Assessment /Muscle Length  yes    Hamstrings  LT 165; RT 155      Palpation   Palpation comment  moderate tightness palpated with deep pressure.        Ambulation/Gait   Ambulation/Gait  No                Objective measurements completed on examination: See above findings.      [facility] PT Treatment/Exercise - 10/06/17 0001      Exercises   Exercises  Knee/Hip      Knee/Hip Exercises: Stretches   Active Hamstring Stretch  Right;3 reps;30 seconds      Knee/Hip Exercises: Prone   Hamstring  Curl  10 reps      Manual Therapy   Manual Therapy  Soft tissue mobilization    Manual therapy comments  seperate from all other aspects of treatment     Soft tissue mobilization  to decrease spasm of right hamstring.              PT Education - 10/06/17 1446    Education Details  Injury occurred yesterday, there is no bruising and minimal spasm.  Simple mm strain which most likely will resolve in 1-2 weeks.    Person(s) Educated  Patient    Methods  Explanation    Comprehension  Verbalized understanding       PT Short Term Goals - 10/06/17 1457      PT SHORT TERM GOAL #1   Title  Pt to have 4/5 strength in Rt hamstring in order to be able to rise from a squatted position without pain.     Time  2    Period  Weeks    Status  New    Target Date  10/20/17      PT SHORT TERM GOAL #2   Title  PT Pain in his right hamstring to be no greater than a 2/10  to allow pt to walk for 30 mintues without increase pain .    Time  2    Period  Weeks    Status  New                Plan - 10/06/17 1449    Clinical Impression Statement  [name] is a 52 yo who slipped in water yesterday causing a strain of his right hamstring  strain.  There is minimal swelling, no bruising, normal balance, ROM is normal all mm are normal except for hamstring strength.   He will benefit from a short period of physical therapy to progress him thru functional activitties to return him to his prior functional level.     Clinical Presentation  Stable    Clinical Decision Making  Low    Rehab Potential  Good    PT Frequency  2x / week    PT Duration  2 weeks    PT Treatment/Interventions  ADLs/Self Care Home Management;Therapeutic exercise;Patient/family education;Manual techniques    PT Next Visit Plan  begin rockerboard, standing knee flexion, lunging and step up.  Progress as able stay pain free.     PT Home Exercise Plan  Hamstring stretch, prone knee flexion.     Consulted and Agree with Plan  of Care  Patient       Patient will benefit from skilled therapeutic intervention in order to improve the following deficits and impairments:  Abnormal gait, Pain, Decreased strength  Visit Diagnosis: Difficulty in walking, not elsewhere classified - Plan: PT plan of care cert/re-cert     Problem List Patient Active Problem List   Diagnosis Date Noted  . Newly diagnosed diabetes ([place]) 01/13/2017  . Type 2 diabetes mellitus without complications ([place]) 86/38/1771  . Prediabetes 05/24/2016  . Patellar tendinitis of left knee 12/11/2013  . Morbid obesity ([place]) 05/09/2013  . Osteoarthritis of left knee 11/09/2012  . Obstructive sleep apnea 09/11/2012  . HTN (hypertension) 09/11/2012  . Impaired fasting glucose 09/11/2012  . Degenerative arthritis of left knee 06/03/2011  . S/P lateral meniscectomy of left knee 06/03/2011  . BACK PAIN 05/01/2009  . DERANGEMENT MENISCUS 09/04/2007  . ELBOW PAIN, BILATERAL 04/06/2007  . DEGENERATIVE JOINT DISEASE, KNEE 03/13/2007  . Pain in joint, lower leg 03/13/2007   Rayetta Humphrey, PT CLT [phone] 10/06/2017, 3:03 PM  [facility] [address] [place], [place], [address] Phone: [phone]   Fax:  [phone]  Name: HASKEL DEWALT MRN: [id number] Date of Birth: [birth year]

## 2017-10-06 NOTE — Patient Instructions (Addendum)
Self-Mobilization: Knee Flexion (Prone)    Bring left heel toward buttocks as close as possible. Hold __5__ seconds. Relax. Repeat 10_-20___ times per set. Do 1____ sets per session. Do __2__ sessions per day.  http://orth.exer.us/596   Copyright  VHI. All rights reserved.  Stretching: Hamstring (Supine)    Supporting right thigh behind knee, slowly straighten knee until stretch is felt in back of thigh. Hold __30__ seconds. Repeat ___1_ times per set. Do ___1_ sets per session. Do __2__ sessions per day.  http://orth.exer.us/656   Copyright  VHI. All rights reserved.

## 2017-10-10 ENCOUNTER — Other Ambulatory Visit: Payer: Self-pay | Admitting: *Deleted

## 2017-10-10 ENCOUNTER — Telehealth: Payer: Self-pay | Admitting: Family Medicine

## 2017-10-10 MED ORDER — SULFAMETHOXAZOLE-TRIMETHOPRIM 800-160 MG PO TABS: 1.0000 | ORAL_TABLET | Freq: Two times a day (BID) | ORAL | 0 refills | Status: DC

## 2017-10-10 NOTE — Telephone Encounter (Signed)
May ref times one 

## 2017-10-10 NOTE — Telephone Encounter (Signed)
Wife bing seen this morning and said place on chest getting better but not quite healed.  Would like a refill on the sulfamethoxazole-trimethoprim (BACTRIM DS,SEPTRA DS) 800-160 MG   [facility]

## 2017-10-10 NOTE — Telephone Encounter (Signed)
Refill sent to pharm. Pt notified  

## 2017-10-11 ENCOUNTER — Encounter (HOSPITAL_COMMUNITY): Payer: Self-pay | Admitting: Physical Therapy

## 2017-10-11 ENCOUNTER — Ambulatory Visit (HOSPITAL_COMMUNITY): Payer: BLUE CROSS/BLUE SHIELD

## 2017-10-11 ENCOUNTER — Telehealth (HOSPITAL_COMMUNITY): Payer: Self-pay

## 2017-10-11 ENCOUNTER — Ambulatory Visit (HOSPITAL_COMMUNITY): Payer: BLUE CROSS/BLUE SHIELD | Admitting: Physical Therapy

## 2017-10-11 DIAGNOSIS — R262 Difficulty in walking, not elsewhere classified: Secondary | ICD-10-CM | POA: Diagnosis not present

## 2017-10-11 NOTE — Therapy (Signed)
[facility] [address] [place], [place], [address] Phone: [phone]   Fax:  [phone]  Physical Therapy Treatment  Patient Details  Name: Adam Hahn MRN: [id number] Date of Birth: 1966-01-26 Referring Provider: Sallee Lange    Encounter Date: 10/11/2017  PT End of Session - 10/11/17 1056    Visit Number  2    Number of Visits  4    Date for PT Re-Evaluation  10/20/17    Authorization Type  BCBS    Authorization - Visit Number  2    Authorization - Number of Visits  4    PT Start Time  6962    PT Stop Time  1120    PT Time Calculation (min)  40 min    Equipment Utilized During Treatment  Gait belt    Activity Tolerance  Patient tolerated treatment well    Behavior During Therapy  [facility] for tasks assessed/performed       Past Medical History:  Diagnosis Date  . Allergic rhinitis   . HTN (hypertension)   . IFG (impaired fasting glucose)   . Obesity   . Sleep apnea    CPAP    Past Surgical History:  Procedure Laterality Date  . CHOLECYSTECTOMY    . KNEE SURGERY     x 5    There were no vitals filed for this visit.  Subjective Assessment - 10/11/17 1044    Subjective  Pt states that he is doing alright with the exercises     Limitations  Lifting;Standing;House hold activities    How long can you sit comfortably?  no problem     How long can you stand comfortably?  able to stand but his leg feels heavy     How long can you walk comfortably?  immediately begins to hurt he has not done much walking.     Patient Stated Goals  less pain     Currently in Pain?  Yes    Pain Score  5     Pain Location  Leg    Pain Orientation  Right;Upper    Pain Descriptors / Indicators  Aching;Tightness    Pain Type  Acute pain    Pain Onset  Yesterday                       [facility] Adult PT Treatment/Exercise - 10/11/17 0001      Exercises   Exercises  Knee/Hip      Knee/Hip Exercises: Stretches   Active  Hamstring Stretch  3 reps;30 seconds    Gastroc Stretch Limitations  slant board x 30" x 2       Knee/Hip Exercises: Standing   Heel Raises  Both;10 reps    Knee Flexion  Right;10 reps    Forward Lunges  Right;10 reps    Forward Step Up  10 reps      Knee/Hip Exercises: Prone   Hamstring Curl  10 reps    Hamstring Curl Limitations  3#    Hip Extension  10 reps    Hip Extension Limitations  3#      Manual Therapy   Manual Therapy  Soft tissue mobilization    Manual therapy comments  seperate from all other aspects of treatment     Soft tissue mobilization  to decrease spasm of right hamstring.                PT Short Term  Goals - 10/11/17 1102      PT SHORT TERM GOAL #1   Title  Pt to have 4/5 strength in Rt hamstring in order to be able to rise from a squatted position without pain.     Time  2    Period  Weeks    Status  On-going      PT SHORT TERM GOAL #2   Title  PT Pain in his right hamstring to be no greater than a 2/10  to allow pt to walk for 30 mintues without increase pain .    Time  2    Period  Weeks    Status  On-going               Plan - 10/11/17 1056    Clinical Impression Statement  Reviewed evaluation and goals with patient.  Today pt has noted bruising along Rt medial hamstring. Added rockerboard, standing knee flexion, lunges and step ups without difficulty.     Rehab Potential  Good    PT Frequency  2x / week    PT Duration  2 weeks    PT Treatment/Interventions  ADLs/Self Care Home Management;Therapeutic exercise;Patient/family education;Manual techniques    PT Next Visit Plan  Complete sit to stand and steps. Progress as able    PT Home Exercise Plan  Hamstring stretch, prone knee flexion.     Consulted and Agree with Plan of Care  Patient       Patient will benefit from skilled therapeutic intervention in order to improve the following deficits and impairments:  Abnormal gait, Pain, Decreased strength  Visit  Diagnosis: Difficulty in walking, not elsewhere classified     Problem List Patient Active Problem List   Diagnosis Date Noted  . Newly diagnosed diabetes ([place]) 01/13/2017  . Type 2 diabetes mellitus without complications ([place]) 83/66/2947  . Prediabetes 05/24/2016  . Patellar tendinitis of left knee 12/11/2013  . Morbid obesity ([place]) 05/09/2013  . Osteoarthritis of left knee 11/09/2012  . Obstructive sleep apnea 09/11/2012  . HTN (hypertension) 09/11/2012  . Impaired fasting glucose 09/11/2012  . Degenerative arthritis of left knee 06/03/2011  . S/P lateral meniscectomy of left knee 06/03/2011  . BACK PAIN 05/01/2009  . DERANGEMENT MENISCUS 09/04/2007  . ELBOW PAIN, BILATERAL 04/06/2007  . DEGENERATIVE JOINT DISEASE, KNEE 03/13/2007  . Pain in joint, lower leg 03/13/2007    Rayetta Humphrey, PT CLT [phone] 10/11/2017, 11:21 AM  [facility] [place], [place], [address] Phone: [phone]   Fax:  [phone]  Name: Adam Hahn MRN: [id number] Date of Birth: [birth year]

## 2017-10-11 NOTE — Patient Instructions (Addendum)
Heel Raise: Bilateral (Standing)    Rise on balls of feet. Repeat __10__ times per set. Do __1__ sets per session. Do _2___ sessions per day.  http://orth.exer.us/38   Copyright  VHI. All rights reserved.  Forward Lunge    Standing with feet shoulder width apart and stomach tight, step forward with right leg. Repeat ___10_ times per set. Do __1__ sets per session. Do _2___ sessions per day.  http://orth.exer.us/1146   Copyright  VHI. All rights reserved.  Strengthening: Hip Extension (Prone)    Tighten muscles on front of right thigh, then lift leg _2___ inches from surface, keeping knee locked. Repeat _10___ times per set. Do _1___ sets per session. Do __2__ sessions per day.  http://orth.exer.us/620   Copyright  VHI. All rights reserved.

## 2017-10-11 NOTE — Telephone Encounter (Signed)
No show, called and spoke to pt who thought apt scheduled for later today.  Apt scheduled for later today.  [address], [place]; CBIS [phone]

## 2017-10-14 ENCOUNTER — Ambulatory Visit (HOSPITAL_COMMUNITY): Payer: BLUE CROSS/BLUE SHIELD

## 2017-10-14 ENCOUNTER — Encounter (HOSPITAL_COMMUNITY): Payer: Self-pay

## 2017-10-14 DIAGNOSIS — R262 Difficulty in walking, not elsewhere classified: Secondary | ICD-10-CM

## 2017-10-14 NOTE — Therapy (Signed)
[facility] [address] [place], [place], [address] Phone: [phone]   Fax:  [phone]  Physical Therapy Treatment  Patient Details  Name: Adam Hahn MRN: [id number] Date of Birth: 01/10/1966 Referring Provider: Sallee Lange   Encounter Date: 10/14/2017  PT End of Session - 10/14/17 1024    Visit Number  3    Number of Visits  4    Date for PT Re-Evaluation  10/20/17    Authorization Type  BCBS    Authorization Time Period  60 limit    Authorization - Visit Number  3    Authorization - Number of Visits  60    PT Start Time  [phone]    PT Stop Time  1025    PT Time Calculation (min)  38 min    Activity Tolerance  Patient tolerated treatment well    Behavior During Therapy  [facility] for tasks assessed/performed       Past Medical History:  Diagnosis Date  . Allergic rhinitis   . HTN (hypertension)   . IFG (impaired fasting glucose)   . Obesity   . Sleep apnea    CPAP    Past Surgical History:  Procedure Laterality Date  . CHOLECYSTECTOMY    . KNEE SURGERY     x 5    There were no vitals filed for this visit.  Subjective Assessment - 10/14/17 0958    Subjective  Pt stated he is feeling good, reports no pain unless makes the wrong turn while walking.  Reports compliance with HEP.    Patient Stated Goals  less pain     Currently in Pain?  No/denies         [facility] PT Assessment - 10/14/17 0001      Assessment   Medical Diagnosis  Rt hamstring strain    Referring Provider  Nicki Reaper Luking    Onset Date/Surgical Date  10/05/17    Next MD Visit  unscheduled    Prior Therapy  none      Precautions   Precautions  None                   [facility] Adult PT Treatment/Exercise - 10/14/17 0001      Knee/Hip Exercises: Stretches   Active Hamstring Stretch  3 reps;30 seconds    Gastroc Stretch  3 reps;30 seconds    Gastroc Stretch Limitations  slant board       Knee/Hip Exercises: Standing   Heel Raises   Both;20 reps Toe raises 20    Knee Flexion  Right;10 reps;Limitations    Knee Flexion Limitations  4#    Forward Lunges  Both;15 reps    Lateral Step Up  Right;15 reps;Hand Hold: 0;Step Height: 6"    Forward Step Up  Right;15 reps;Hand Hold: 0;Step Height: 6"    Step Down  Right;10 reps;Hand Hold: 1;Step Height: 6"    Functional Squat  2 sets;10 reps good mechanics    Rocker Board  2 minutes lateral      Knee/Hip Exercises: Seated   Sit to Sand  10 reps;without UE support eccentric control      Knee/Hip Exercises: Prone   Hamstring Curl  15 reps    Hamstring Curl Limitations  4#    Hip Extension  10 reps    Hip Extension Limitations  4#      Manual Therapy   Manual Therapy  Soft tissue mobilization    Manual therapy  comments  seperate from all other aspects of treatment     Soft tissue mobilization  to decrease spasm of right hamstring.                PT Short Term Goals - 10/11/17 1102      PT SHORT TERM GOAL #1   Title  Pt to have 4/5 strength in Rt hamstring in order to be able to rise from a squatted position without pain.     Time  2    Period  Weeks    Status  On-going      PT SHORT TERM GOAL #2   Title  PT Pain in his right hamstring to be no greater than a 2/10  to allow pt to walk for 30 mintues without increase pain .    Time  2    Period  Weeks    Status  On-going               Plan - 10/14/17 1025    Clinical Impression Statement  Progressed functional strengthening with additional STS, squats and step up, lateral and step down training.  Pt able to complete all exercises with good form following initial demonstration.  EOS wiht manual to address soft tissue restrictions with minimal spasms noted, noted bruising Rt medial hamstrings.      Rehab Potential  Good    PT Frequency  2x / week    PT Duration  2 weeks    PT Treatment/Interventions  ADLs/Self Care Home Management;Therapeutic exercise;Patient/family education;Manual techniques    PT  Next Visit Plan  Reassess next session.  Assess stair mechanics and add SLS for stability.    PT Home Exercise Plan  Hamstring stretch, prone knee flexion.        Patient will benefit from skilled therapeutic intervention in order to improve the following deficits and impairments:  Abnormal gait, Pain, Decreased strength  Visit Diagnosis: Difficulty in walking, not elsewhere classified     Problem List Patient Active Problem List   Diagnosis Date Noted  . Newly diagnosed diabetes ([place]) 01/13/2017  . Type 2 diabetes mellitus without complications ([place]) 73/53/2992  . Prediabetes 05/24/2016  . Patellar tendinitis of left knee 12/11/2013  . Morbid obesity ([place]) 05/09/2013  . Osteoarthritis of left knee 11/09/2012  . Obstructive sleep apnea 09/11/2012  . HTN (hypertension) 09/11/2012  . Impaired fasting glucose 09/11/2012  . Degenerative arthritis of left knee 06/03/2011  . S/P lateral meniscectomy of left knee 06/03/2011  . BACK PAIN 05/01/2009  . DERANGEMENT MENISCUS 09/04/2007  . ELBOW PAIN, BILATERAL 04/06/2007  . DEGENERATIVE JOINT DISEASE, KNEE 03/13/2007  . Pain in joint, lower leg 03/13/2007   Ihor Austin, LPTA; CBIS [phone]  Aldona Lento 10/14/2017, 10:28 AM  [facility] [place], [place], [address] Phone: [phone]   Fax:  [phone]  Name: Adam Hahn MRN: [id number] Date of Birth: 1965/05/05

## 2017-10-17 ENCOUNTER — Telehealth (HOSPITAL_COMMUNITY): Payer: Self-pay | Admitting: Physical Therapy

## 2017-10-17 ENCOUNTER — Ambulatory Visit (HOSPITAL_COMMUNITY): Payer: BLUE CROSS/BLUE SHIELD | Admitting: Physical Therapy

## 2017-10-17 NOTE — Telephone Encounter (Signed)
His work schedule has changed and he will call back to r/s this afternoon

## 2017-10-20 ENCOUNTER — Encounter (HOSPITAL_COMMUNITY): Payer: BLUE CROSS/BLUE SHIELD | Admitting: Physical Therapy

## 2017-11-16 ENCOUNTER — Encounter: Payer: Self-pay | Admitting: Family Medicine

## 2017-11-16 ENCOUNTER — Ambulatory Visit: Payer: BLUE CROSS/BLUE SHIELD | Admitting: Family Medicine

## 2017-11-16 VITALS — BP 118/78 | Temp 98.7°F | Ht 72.5 in | Wt 271.4 lb

## 2017-11-16 DIAGNOSIS — R358 Other polyuria: Secondary | ICD-10-CM

## 2017-11-16 DIAGNOSIS — R3589 Other polyuria: Secondary | ICD-10-CM

## 2017-11-16 DIAGNOSIS — N410 Acute prostatitis: Secondary | ICD-10-CM | POA: Diagnosis not present

## 2017-11-16 LAB — POCT URINALYSIS DIPSTICK
PH UA: 6 (ref 5.0–8.0)
Spec Grav, UA: 1.02 (ref 1.010–1.025)

## 2017-11-16 MED ORDER — CIPROFLOXACIN HCL 750 MG PO TABS: ORAL_TABLET | ORAL | 0 refills | Status: DC

## 2017-11-16 NOTE — Progress Notes (Signed)
   Subjective:    Patient ID: Adam Hahn, male    DOB: 1966-03-29, 52 y.o.   MRN: [id number]  HPI  Patient arrives with frequent urination for one day.  Results for orders placed or performed in visit on 11/16/17  POCT urinalysis dipstick  Result Value Ref Range   Color, UA     Clarity, UA     Glucose, UA  Negative   Bilirubin, UA     Ketones, UA     Spec Grav, UA 1.020 1.010 - 1.025   Blood, UA     pH, UA 6.0 5.0 - 8.0   Protein, UA  Negative   Urobilinogen, UA  0.2 or 1.0 E.U./dL   Nitrite, UA     Leukocytes, UA  Negative   Appearance     Odor     Hit yesterday , incr urinating every hr or so   b s as 92, ck again last eve 107  One hx of prostatitis   Last wk noted hesitancy with urinating    No fevdr or chills   No   Nausea        Review of Systems No headache, no major weight loss or weight gain, no chest pain no back pain abdominal pain no change in bowel habits complete ROS otherwise negative     Objective:   Physical Exam Alert vitals stable, NAD. Blood pressure good on repeat. HEENT normal. Lungs clear. Heart regular rate and rhythm. Prostate gland boggy and tender  Urinalysis normal       Assessment & Plan:  Impression acute prostatitis.  Discussed at length.  Initiate Cipro twice daily 21 days symptom care discussed warning signs discussed

## 2017-11-30 ENCOUNTER — Encounter (HOSPITAL_COMMUNITY): Payer: Self-pay | Admitting: Physical Therapy

## 2017-11-30 NOTE — Therapy (Signed)
[facility] [place], [place], [address] Phone: [phone]   Fax:  [phone]  Patient Details  Name: Adam Hahn MRN: [id number] Date of Birth: [birth year] Referring Provider:  No ref. provider found  Encounter Date: 11/30/2017 PHYSICAL THERAPY DISCHARGE SUMMARY  Visits from Start of Care: 3  Current functional level related to goals / functional outcomes: PT pain decreased. Function improved    Remaining deficits: none   Education / Equipment: HEP Plan: Patient agrees to discharge.  Patient goals were partially met. Patient is being discharged due to being pleased with the current functional level.  ?????    Goals may have been all met but pt did not return for final visit.  Rayetta Humphrey, PT CLT [phone] 11/30/2017, 3:18 PM  [facility] [address] [place], [place], [address] Phone: [phone]   Fax:  [phone]

## 2018-04-07 DIAGNOSIS — Z135 Encounter for screening for eye and ear disorders: Secondary | ICD-10-CM | POA: Diagnosis not present

## 2018-04-07 DIAGNOSIS — E119 Type 2 diabetes mellitus without complications: Secondary | ICD-10-CM | POA: Diagnosis not present

## 2018-05-04 ENCOUNTER — Telehealth: Payer: Self-pay | Admitting: Family Medicine

## 2018-05-04 DIAGNOSIS — Z13220 Encounter for screening for lipoid disorders: Secondary | ICD-10-CM

## 2018-05-04 DIAGNOSIS — Z125 Encounter for screening for malignant neoplasm of prostate: Secondary | ICD-10-CM

## 2018-05-04 DIAGNOSIS — Z79899 Other long term (current) drug therapy: Secondary | ICD-10-CM

## 2018-05-04 DIAGNOSIS — E119 Type 2 diabetes mellitus without complications: Secondary | ICD-10-CM

## 2018-05-04 DIAGNOSIS — I10 Essential (primary) hypertension: Secondary | ICD-10-CM

## 2018-05-04 DIAGNOSIS — Z0000 Encounter for general adult medical examination without abnormal findings: Secondary | ICD-10-CM

## 2018-05-04 NOTE — Telephone Encounter (Signed)
Last labs 04/29/17 lipid, liver, bmp, a1c

## 2018-05-04 NOTE — Telephone Encounter (Signed)
Pt is scheduled for a physical on 06/01/2018 w/ Dr.Steve, requesting blood work.

## 2018-05-04 NOTE — Telephone Encounter (Signed)
Lab sent and pt is aware.

## 2018-05-04 NOTE — Telephone Encounter (Signed)
Rep same plus psa

## 2018-05-09 DIAGNOSIS — E119 Type 2 diabetes mellitus without complications: Secondary | ICD-10-CM | POA: Diagnosis not present

## 2018-05-09 DIAGNOSIS — I10 Essential (primary) hypertension: Secondary | ICD-10-CM | POA: Diagnosis not present

## 2018-05-09 DIAGNOSIS — Z79899 Other long term (current) drug therapy: Secondary | ICD-10-CM | POA: Diagnosis not present

## 2018-05-09 DIAGNOSIS — Z125 Encounter for screening for malignant neoplasm of prostate: Secondary | ICD-10-CM | POA: Diagnosis not present

## 2018-05-09 DIAGNOSIS — Z0000 Encounter for general adult medical examination without abnormal findings: Secondary | ICD-10-CM | POA: Diagnosis not present

## 2018-05-10 LAB — BASIC METABOLIC PANEL
BUN/Creatinine Ratio: 8 — ABNORMAL LOW (ref 9–20)
BUN: 10 mg/dL (ref 6–24)
CALCIUM: 9.9 mg/dL (ref 8.7–10.2)
CO2: 27 mmol/L (ref 20–29)
CREATININE: 1.2 mg/dL (ref 0.76–1.27)
Chloride: 102 mmol/L (ref 96–106)
GFR, EST AFRICAN AMERICAN: 81 mL/min/{1.73_m2} (ref >59)
GFR, EST NON AFRICAN AMERICAN: 70 mL/min/{1.73_m2} (ref >59)
Glucose: 111 mg/dL — ABNORMAL HIGH (ref 65–99)
Potassium: 4.6 mmol/L (ref 3.5–5.2)
Sodium: 144 mmol/L (ref 134–144)

## 2018-05-10 LAB — PSA: PROSTATE SPECIFIC AG, SERUM: 0.3 ng/mL (ref 0.0–4.0)

## 2018-05-10 LAB — HEPATIC FUNCTION PANEL
ALBUMIN: 4.7 g/dL (ref 3.5–5.5)
ALT: 34 IU/L (ref 0–44)
AST: 24 IU/L (ref 0–40)
Alkaline Phosphatase: 57 IU/L (ref 39–117)
BILIRUBIN TOTAL: 0.4 mg/dL (ref 0.0–1.2)
BILIRUBIN, DIRECT: 0.15 mg/dL (ref 0.00–0.40)
TOTAL PROTEIN: 7.2 g/dL (ref 6.0–8.5)

## 2018-05-10 LAB — LIPID PANEL
CHOL/HDL RATIO: 3.5 ratio (ref 0.0–5.0)
Cholesterol, Total: 152 mg/dL (ref 100–199)
HDL: 43 mg/dL (ref >39)
LDL CALC: 87 mg/dL (ref 0–99)
Triglycerides: 112 mg/dL (ref 0–149)
VLDL Cholesterol Cal: 22 mg/dL (ref 5–40)

## 2018-05-10 LAB — HEMOGLOBIN A1C
Est. average glucose Bld gHb Est-mCnc: 131 mg/dL
Hgb A1c MFr Bld: 6.2 % — ABNORMAL HIGH (ref 4.8–5.6)

## 2018-06-01 ENCOUNTER — Encounter: Payer: Self-pay | Admitting: Family Medicine

## 2018-06-01 ENCOUNTER — Ambulatory Visit (INDEPENDENT_AMBULATORY_CARE_PROVIDER_SITE_OTHER): Payer: BLUE CROSS/BLUE SHIELD | Admitting: Family Medicine

## 2018-06-01 VITALS — BP 128/82 | Ht 72.5 in | Wt 282.4 lb

## 2018-06-01 DIAGNOSIS — E119 Type 2 diabetes mellitus without complications: Secondary | ICD-10-CM | POA: Diagnosis not present

## 2018-06-01 DIAGNOSIS — I10 Essential (primary) hypertension: Secondary | ICD-10-CM | POA: Diagnosis not present

## 2018-06-01 DIAGNOSIS — Z23 Encounter for immunization: Secondary | ICD-10-CM | POA: Diagnosis not present

## 2018-06-01 DIAGNOSIS — Z0000 Encounter for general adult medical examination without abnormal findings: Secondary | ICD-10-CM | POA: Diagnosis not present

## 2018-06-01 DIAGNOSIS — G4733 Obstructive sleep apnea (adult) (pediatric): Secondary | ICD-10-CM | POA: Diagnosis not present

## 2018-06-01 DIAGNOSIS — Z1211 Encounter for screening for malignant neoplasm of colon: Secondary | ICD-10-CM

## 2018-06-01 MED ORDER — VERAPAMIL HCL ER 240 MG PO TBCR: 240.0000 mg | EXTENDED_RELEASE_TABLET | Freq: Every day | ORAL | 5 refills | Status: DC

## 2018-06-01 MED ORDER — CHOLESTYRAMINE 4 GM/DOSE PO POWD: ORAL | 5 refills | Status: DC

## 2018-06-01 MED ORDER — METFORMIN HCL 1000 MG PO TABS: 1000.0000 mg | ORAL_TABLET | Freq: Two times a day (BID) | ORAL | 5 refills | Status: DC

## 2018-06-01 NOTE — Progress Notes (Signed)
Subjective:    Patient ID: Adam Hahn, male    DOB: 09/11/1965, 53 y.o.   MRN: [id number]  HPI  The patient comes in today for a wellness visit.  Results for orders placed or performed in visit on 05/04/18  Lipid panel  Result Value Ref Range   Cholesterol, Total 152 100 - 199 mg/dL   Triglycerides 112 0 - 149 mg/dL   HDL 43 >39 mg/dL   VLDL Cholesterol Cal 22 5 - 40 mg/dL   LDL Calculated 87 0 - 99 mg/dL   Chol/HDL Ratio 3.5 0.0 - 5.0 ratio  Hepatic function panel  Result Value Ref Range   Total Protein 7.2 6.0 - 8.5 g/dL   Albumin 4.7 3.5 - 5.5 g/dL   Bilirubin Total 0.4 0.0 - 1.2 mg/dL   Bilirubin, Direct 0.15 0.00 - 0.40 mg/dL   Alkaline Phosphatase 57 39 - 117 IU/L   AST 24 0 - 40 IU/L   ALT 34 0 - 44 IU/L  Basic metabolic panel  Result Value Ref Range   Glucose 111 (H) 65 - 99 mg/dL   BUN 10 6 - 24 mg/dL   Creatinine, Ser 1.20 0.76 - 1.27 mg/dL   GFR calc non Af Amer 70 >59 mL/min/1.73   GFR calc Af Amer 81 >59 mL/min/1.73   BUN/Creatinine Ratio 8 (L) 9 - 20   Sodium 144 134 - 144 mmol/L   Potassium 4.6 3.5 - 5.2 mmol/L   Chloride 102 96 - 106 mmol/L   CO2 27 20 - 29 mmol/L   Calcium 9.9 8.7 - 10.2 mg/dL  Hemoglobin A1c  Result Value Ref Range   Hgb A1c MFr Bld 6.2 (H) 4.8 - 5.6 %   Est. average glucose Bld gHb Est-mCnc 131 mg/dL  PSA  Result Value Ref Range   Prostate Specific Ag, Serum 0.3 0.0 - 4.0 ng/mL    Patient claims compliance with diabetes medication. No obvious side effects. Reports no substantial low sugar spells. Most numbers are generally in good range when checked fasting. Generally does not miss a dose of medication. Watching diabetic diet closely   A review of their health history was completed.  A review of medications was also completed.  Any needed refills; yes  Eating habits: eating healthy  Falls/  MVA accidents in past few months: no  Regular exercise: yes- treadmill  Specialist pt sees on regular basis:  no  Preventative health issues were discussed.   Additional concerns: none  Blood pressure medicine and blood pressure levels reviewed today with patient. Compliant with blood pressure medicine. States does not miss a dose. No obvious side effects. Blood pressure generally good when checked elsewhere. Watching salt intake.  Fasting numb excellent      Review of Systems  Constitutional: Negative for activity change, appetite change and fever.  HENT: Negative for congestion and rhinorrhea.   Eyes: Negative for discharge.  Respiratory: Negative for cough and wheezing.   Cardiovascular: Negative for chest pain.  Gastrointestinal: Negative for abdominal pain, blood in stool and vomiting.  Genitourinary: Negative for difficulty urinating and frequency.  Musculoskeletal: Negative for neck pain.  Skin: Negative for rash.  Allergic/Immunologic: Negative for environmental allergies and food allergies.  Neurological: Negative for weakness and headaches.  Psychiatric/Behavioral: Negative for agitation.  All other systems reviewed and are negative.      Objective:   Physical Exam Vitals signs reviewed.  Constitutional:      Appearance: He is well-developed.  HENT:  Head: Normocephalic and atraumatic.     Right Ear: External ear normal.     Left Ear: External ear normal.     Nose: Nose normal.  Eyes:     Pupils: Pupils are equal, round, and reactive to light.  Neck:     Musculoskeletal: Normal range of motion and neck supple.     Thyroid: No thyromegaly.  Cardiovascular:     Rate and Rhythm: Normal rate and regular rhythm.     Heart sounds: Normal heart sounds. No murmur.  Pulmonary:     Effort: Pulmonary effort is normal. No respiratory distress.     Breath sounds: Normal breath sounds. No wheezing.  Abdominal:     General: Bowel sounds are normal. There is no distension.     Palpations: Abdomen is soft. There is no mass.     Tenderness: There is no abdominal tenderness.   Genitourinary:    Penis: Normal.      Prostate: Normal.  Musculoskeletal: Normal range of motion.  Lymphadenopathy:     Cervical: No cervical adenopathy.  Skin:    General: Skin is warm and dry.     Findings: No erythema.  Neurological:     Mental Status: He is alert.     Motor: No abnormal muscle tone.  Psychiatric:        Behavior: Behavior normal.        Judgment: Judgment normal.           Assessment & Plan:  #1 impression Welless.. Patient declines flu shot.  Will do Tdap.  Diet discussed.  Exercise discussed.  Preventive measures discussed.  Will work on setting up for colonoscopy.  2.  Type 2 diabetes.  A1c somewhat up with still great control.  Discussed to maintain same meds eye doctor exam encouraged  3.  Hypertension good control discussed to maintain same meds  Medications refilled.  Diet exercise discussed.  Follow-up in 6 months.

## 2018-06-02 ENCOUNTER — Encounter: Payer: Self-pay | Admitting: Family Medicine

## 2018-06-05 ENCOUNTER — Encounter (INDEPENDENT_AMBULATORY_CARE_PROVIDER_SITE_OTHER): Payer: Self-pay | Admitting: *Deleted

## 2018-06-09 ENCOUNTER — Encounter: Payer: Self-pay | Admitting: Family Medicine

## 2018-06-09 ENCOUNTER — Ambulatory Visit (HOSPITAL_COMMUNITY)
Admission: RE | Admit: 2018-06-09 | Discharge: 2018-06-09 | Disposition: A | Discharge status: Home / Self Care | Payer: BLUE CROSS/BLUE SHIELD | Source: Ambulatory Visit | Attending: Family Medicine | Admitting: Family Medicine

## 2018-06-09 ENCOUNTER — Ambulatory Visit: Payer: BLUE CROSS/BLUE SHIELD | Admitting: Family Medicine

## 2018-06-09 VITALS — BP 142/92 | Temp 97.5°F | Ht 72.5 in | Wt 286.0 lb

## 2018-06-09 DIAGNOSIS — G542 Cervical root disorders, not elsewhere classified: Secondary | ICD-10-CM | POA: Insufficient documentation

## 2018-06-09 DIAGNOSIS — M542 Cervicalgia: Secondary | ICD-10-CM | POA: Diagnosis not present

## 2018-06-09 MED ORDER — DICLOFENAC SODIUM 75 MG PO TBEC: 75.0000 mg | DELAYED_RELEASE_TABLET | Freq: Two times a day (BID) | ORAL | 0 refills | Status: DC

## 2018-06-09 MED ORDER — GABAPENTIN 100 MG PO CAPS: 100.0000 mg | ORAL_CAPSULE | Freq: Three times a day (TID) | ORAL | 0 refills | Status: DC

## 2018-06-09 NOTE — Patient Instructions (Signed)
Please do your cervical spine x-ray we will let you know the results  Diclofenac is the anti-inflammatory take 1 twice a day with a snack and it should help with some of the discomfort you are having.  Do not take ibuprofen with this at all.  Gabapentin is a nerve modulator.  Originally it was discovered as a seizure medicine but it is very effective at lessening nerve root pain from an impingement.  Start off with 1 at night for the first 3 nights.  Then 1 twice a day for 3 days.  Then 1 3 times daily.  Send [place] an update in approximately 10 days on how you are doing.  Call sooner if problems.  Follow-up with Dr. Richardson Landry in approximately 3 weeks.

## 2018-06-09 NOTE — Progress Notes (Signed)
   Subjective:    Patient ID: Adam Hahn, male    DOB: 1966-03-31, 53 y.o.   MRN: [id number]  HPI  Patient is here today in right side occipital pain that radiates down that side of the neck into the shoulder blade. Patient relates sharp pain on the left side of his neck radiates to the shoulder blade does not radiate to his back.  Does not radiate down the arms.  Denies numbness or tingling.  Does have history of shoulder problems He says he hears some cracking and crunching when turning head. Ongoing for the last two weeks, and did not injury it that he knows of.  He states he has been taking Ibuprofen.  He states for the last month he has had right side shoulder pain, he has a torn labrum and rotator cuff on that side.  He has [facility]le [facility].  Review of Systems    Denies high fever chills sweats wheezing difficulty breathing Objective:   Physical Exam Lungs clear respiratory rate normal heart regular no murmur tenderness on the left side of his neck consistent with possibility of impingement reflexes in the arm good strength in the arms good       Assessment & Plan:  Nerve impingement Recommend anti-inflammatory Recommend gabapentin slowly titrate to 3 weeks MRI not indicated currently Plain x-rays ordered Follow-up if progressive troubles or worse

## 2018-06-29 ENCOUNTER — Ambulatory Visit: Payer: BLUE CROSS/BLUE SHIELD | Admitting: Family Medicine

## 2018-07-14 ENCOUNTER — Telehealth: Payer: Self-pay | Admitting: *Deleted

## 2018-07-14 NOTE — Telephone Encounter (Signed)
Script signed and pt notified.

## 2018-07-14 NOTE — Telephone Encounter (Signed)
Sure wis

## 2018-07-14 NOTE — Telephone Encounter (Signed)
Pt is requesting a rx for cold therapy unit for his shoulder pain due to rotator cuff injury. He wants to pick up this if you approve.

## 2018-07-14 NOTE — Telephone Encounter (Signed)
Pt states he is having surgery next month on his rotator cuff next month and he has already bought this device and paid for it but he was told that his flex account would reimburse him if he had a prescription. Dr. Onnie Graham at [facility] is doing surgery

## 2018-07-14 NOTE — Telephone Encounter (Signed)
Sorry not familiar with this. How much does it cost?

## 2018-07-14 NOTE — Telephone Encounter (Signed)
Script written out and awaiting signature

## 2018-08-21 DIAGNOSIS — M546 Pain in thoracic spine: Secondary | ICD-10-CM | POA: Diagnosis not present

## 2018-08-21 DIAGNOSIS — M545 Low back pain: Secondary | ICD-10-CM | POA: Diagnosis not present

## 2018-08-21 DIAGNOSIS — M9902 Segmental and somatic dysfunction of thoracic region: Secondary | ICD-10-CM | POA: Diagnosis not present

## 2018-08-21 DIAGNOSIS — M9903 Segmental and somatic dysfunction of lumbar region: Secondary | ICD-10-CM | POA: Diagnosis not present

## 2018-08-23 DIAGNOSIS — M9903 Segmental and somatic dysfunction of lumbar region: Secondary | ICD-10-CM | POA: Diagnosis not present

## 2018-08-23 DIAGNOSIS — M546 Pain in thoracic spine: Secondary | ICD-10-CM | POA: Diagnosis not present

## 2018-08-23 DIAGNOSIS — M9902 Segmental and somatic dysfunction of thoracic region: Secondary | ICD-10-CM | POA: Diagnosis not present

## 2018-08-23 DIAGNOSIS — M545 Low back pain: Secondary | ICD-10-CM | POA: Diagnosis not present

## 2018-08-25 DIAGNOSIS — M9902 Segmental and somatic dysfunction of thoracic region: Secondary | ICD-10-CM | POA: Diagnosis not present

## 2018-08-25 DIAGNOSIS — M546 Pain in thoracic spine: Secondary | ICD-10-CM | POA: Diagnosis not present

## 2018-08-25 DIAGNOSIS — M545 Low back pain: Secondary | ICD-10-CM | POA: Diagnosis not present

## 2018-08-25 DIAGNOSIS — M9903 Segmental and somatic dysfunction of lumbar region: Secondary | ICD-10-CM | POA: Diagnosis not present

## 2018-08-28 DIAGNOSIS — M545 Low back pain: Secondary | ICD-10-CM | POA: Diagnosis not present

## 2018-08-28 DIAGNOSIS — M9902 Segmental and somatic dysfunction of thoracic region: Secondary | ICD-10-CM | POA: Diagnosis not present

## 2018-08-28 DIAGNOSIS — M546 Pain in thoracic spine: Secondary | ICD-10-CM | POA: Diagnosis not present

## 2018-08-28 DIAGNOSIS — M9903 Segmental and somatic dysfunction of lumbar region: Secondary | ICD-10-CM | POA: Diagnosis not present

## 2018-09-01 DIAGNOSIS — M9903 Segmental and somatic dysfunction of lumbar region: Secondary | ICD-10-CM | POA: Diagnosis not present

## 2018-09-01 DIAGNOSIS — M546 Pain in thoracic spine: Secondary | ICD-10-CM | POA: Diagnosis not present

## 2018-09-01 DIAGNOSIS — M9902 Segmental and somatic dysfunction of thoracic region: Secondary | ICD-10-CM | POA: Diagnosis not present

## 2018-09-01 DIAGNOSIS — M545 Low back pain: Secondary | ICD-10-CM | POA: Diagnosis not present

## 2018-09-06 DIAGNOSIS — M545 Low back pain: Secondary | ICD-10-CM | POA: Diagnosis not present

## 2018-09-06 DIAGNOSIS — M9902 Segmental and somatic dysfunction of thoracic region: Secondary | ICD-10-CM | POA: Diagnosis not present

## 2018-09-06 DIAGNOSIS — M546 Pain in thoracic spine: Secondary | ICD-10-CM | POA: Diagnosis not present

## 2018-09-06 DIAGNOSIS — M9903 Segmental and somatic dysfunction of lumbar region: Secondary | ICD-10-CM | POA: Diagnosis not present

## 2018-09-08 DIAGNOSIS — M546 Pain in thoracic spine: Secondary | ICD-10-CM | POA: Diagnosis not present

## 2018-09-08 DIAGNOSIS — M9903 Segmental and somatic dysfunction of lumbar region: Secondary | ICD-10-CM | POA: Diagnosis not present

## 2018-09-08 DIAGNOSIS — M9902 Segmental and somatic dysfunction of thoracic region: Secondary | ICD-10-CM | POA: Diagnosis not present

## 2018-09-08 DIAGNOSIS — M545 Low back pain: Secondary | ICD-10-CM | POA: Diagnosis not present

## 2018-09-11 DIAGNOSIS — M545 Low back pain: Secondary | ICD-10-CM | POA: Diagnosis not present

## 2018-09-11 DIAGNOSIS — M9903 Segmental and somatic dysfunction of lumbar region: Secondary | ICD-10-CM | POA: Diagnosis not present

## 2018-09-11 DIAGNOSIS — M9902 Segmental and somatic dysfunction of thoracic region: Secondary | ICD-10-CM | POA: Diagnosis not present

## 2018-09-11 DIAGNOSIS — M546 Pain in thoracic spine: Secondary | ICD-10-CM | POA: Diagnosis not present

## 2018-09-15 DIAGNOSIS — M9902 Segmental and somatic dysfunction of thoracic region: Secondary | ICD-10-CM | POA: Diagnosis not present

## 2018-09-15 DIAGNOSIS — M546 Pain in thoracic spine: Secondary | ICD-10-CM | POA: Diagnosis not present

## 2018-09-15 DIAGNOSIS — M9903 Segmental and somatic dysfunction of lumbar region: Secondary | ICD-10-CM | POA: Diagnosis not present

## 2018-09-15 DIAGNOSIS — M545 Low back pain: Secondary | ICD-10-CM | POA: Diagnosis not present

## 2018-09-20 DIAGNOSIS — M9902 Segmental and somatic dysfunction of thoracic region: Secondary | ICD-10-CM | POA: Diagnosis not present

## 2018-09-20 DIAGNOSIS — M9903 Segmental and somatic dysfunction of lumbar region: Secondary | ICD-10-CM | POA: Diagnosis not present

## 2018-09-20 DIAGNOSIS — M545 Low back pain: Secondary | ICD-10-CM | POA: Diagnosis not present

## 2018-09-20 DIAGNOSIS — M546 Pain in thoracic spine: Secondary | ICD-10-CM | POA: Diagnosis not present

## 2018-09-27 ENCOUNTER — Telehealth (HOSPITAL_COMMUNITY): Payer: Self-pay

## 2018-09-27 DIAGNOSIS — M545 Low back pain: Secondary | ICD-10-CM | POA: Diagnosis not present

## 2018-09-27 DIAGNOSIS — M9903 Segmental and somatic dysfunction of lumbar region: Secondary | ICD-10-CM | POA: Diagnosis not present

## 2018-09-27 DIAGNOSIS — M546 Pain in thoracic spine: Secondary | ICD-10-CM | POA: Diagnosis not present

## 2018-09-27 DIAGNOSIS — M9902 Segmental and somatic dysfunction of thoracic region: Secondary | ICD-10-CM | POA: Diagnosis not present

## 2018-09-27 NOTE — Telephone Encounter (Signed)
Pt reports surgery date 10/17/2018-OT due 7-10days after that date. Called Melissa at [phone] Ext#[phone] to confirm surgery date and ask about their office suppling the [place] Approval for this evaluation and tx. Requested return phone call from [facility].

## 2018-10-02 DIAGNOSIS — M546 Pain in thoracic spine: Secondary | ICD-10-CM | POA: Diagnosis not present

## 2018-10-02 DIAGNOSIS — M9902 Segmental and somatic dysfunction of thoracic region: Secondary | ICD-10-CM | POA: Diagnosis not present

## 2018-10-02 DIAGNOSIS — M545 Low back pain: Secondary | ICD-10-CM | POA: Diagnosis not present

## 2018-10-02 DIAGNOSIS — M9903 Segmental and somatic dysfunction of lumbar region: Secondary | ICD-10-CM | POA: Diagnosis not present

## 2018-10-10 ENCOUNTER — Telehealth (HOSPITAL_COMMUNITY): Payer: Self-pay

## 2018-10-10 NOTE — Telephone Encounter (Signed)
Called MD office again l/m for Melissa concerning [place] approval for this pt to start OT on 10/25/2018.

## 2018-10-10 NOTE — Telephone Encounter (Signed)
Melissa from MD office called back stating they have sent [employer] request for approval and it should be faxed to [place]. If we don't get it call her back.

## 2018-10-12 ENCOUNTER — Encounter (HOSPITAL_BASED_OUTPATIENT_CLINIC_OR_DEPARTMENT_OTHER): Payer: Self-pay | Admitting: *Deleted

## 2018-10-12 ENCOUNTER — Other Ambulatory Visit: Payer: Self-pay

## 2018-10-12 NOTE — Progress Notes (Addendum)
Spoke w/ pt via phone for pre-op interview.  Npo after mn w/ exception clear liquid diet until 0430 at which to complete G2 drink then nothing by mouth exception verapamil with sip of water, pt verbalized understanding.   Needs istat and ekg.  Getting covid test done Friday 10-13-2018 @ 1015, and pick up drink with handout instructions.   Pt asked to bring cpap mask and tubing.

## 2018-10-12 NOTE — Progress Notes (Signed)

## 2018-10-13 ENCOUNTER — Other Ambulatory Visit (HOSPITAL_COMMUNITY)
Admission: RE | Admit: 2018-10-13 | Discharge: 2018-10-13 | Disposition: A | Discharge status: Home / Self Care | Payer: BC Managed Care – PPO | Source: Ambulatory Visit | Attending: Orthopedic Surgery | Admitting: Orthopedic Surgery

## 2018-10-13 DIAGNOSIS — Z01812 Encounter for preprocedural laboratory examination: Secondary | ICD-10-CM | POA: Diagnosis not present

## 2018-10-13 DIAGNOSIS — Z1159 Encounter for screening for other viral diseases: Secondary | ICD-10-CM | POA: Diagnosis not present

## 2018-10-14 LAB — NOVEL CORONAVIRUS, NAA (HOSP ORDER, SEND-OUT TO REF LAB; TAT 18-24 HRS): SARS-CoV-2, NAA: NOT DETECTED

## 2018-10-16 ENCOUNTER — Encounter (HOSPITAL_BASED_OUTPATIENT_CLINIC_OR_DEPARTMENT_OTHER): Payer: Self-pay | Admitting: Anesthesiology

## 2018-10-16 MED ORDER — DEXTROSE 5 % IV SOLN
3.0000 g | INTRAVENOUS | Status: AC
  Administered 2018-10-17: 3 g via INTRAVENOUS
  Filled 2018-10-16: qty 3000

## 2018-10-16 NOTE — Anesthesia Preprocedure Evaluation (Addendum)
Anesthesia Evaluation  Patient identified by MRN, date of birth, ID band Patient awake    Reviewed: Allergy & Precautions, NPO status , Patient's Chart, lab work & pertinent test results  Airway Mallampati: II       Dental no notable dental hx. (+) Teeth Intact   Pulmonary sleep apnea and Continuous Positive Airway Pressure Ventilation ,    Pulmonary exam normal breath sounds clear to auscultation       Cardiovascular hypertension, Pt. on medications Normal cardiovascular exam+ Cardiac Defibrillator  Rhythm:Regular Rate:Normal     Neuro/Psych negative neurological ROS  negative psych ROS   GI/Hepatic   Endo/Other  diabetes, Type 2, Oral Hypoglycemic AgentsMorbid obesity  Renal/GU   negative genitourinary   Musculoskeletal  (+) Arthritis ,   Abdominal (+) + obese,   Peds  Hematology   Anesthesia Other Findings   Reproductive/Obstetrics                            Anesthesia Physical Anesthesia Plan  ASA: III  Anesthesia Plan: General   Post-op Pain Management:  Regional for Post-op pain   Induction: Intravenous  PONV Risk Score and Plan: Ondansetron, Dexamethasone and Midazolam  Airway Management Planned: Oral ETT  Additional Equipment:   Intra-op Plan:   Post-operative Plan: Extubation in OR  Informed Consent: I have reviewed the patients History and Physical, chart, labs and discussed the procedure including the risks, benefits and alternatives for the proposed anesthesia with the patient or authorized representative who has indicated his/her understanding and acceptance.     Dental advisory given  Plan Discussed with: CRNA  Anesthesia Plan Comments:        Anesthesia Quick Evaluation

## 2018-10-16 NOTE — Progress Notes (Signed)
SPOKE W/  _[place]     SCREENING SYMPTOMS OF COVID 19:   COUGH--NO  RUNNY NOSE--- NO  SORE THROAT---NO  NASAL CONGESTION----NO  SNEEZING----NO  SHORTNESS OF BREATH---NO  DIFFICULTY BREATHING---NO  TEMP >100.0 -----NO  UNEXPLAINED BODY ACHES------NO  CHILLS --------NO   HEADACHES ---------NO  LOSS OF SMELL/ TASTE --------NO    HAVE YOU OR ANY FAMILY MEMBER TRAVELLED PAST 14 DAYS OUT OF THE   COUNTY---NO STATE----NO COUNTRY---NO-  HAVE YOU OR ANY FAMILY MEMBER BEEN EXPOSED TO ANYONE WITH COVID 19?  NO

## 2018-10-17 ENCOUNTER — Ambulatory Visit (HOSPITAL_BASED_OUTPATIENT_CLINIC_OR_DEPARTMENT_OTHER): Payer: No Typology Code available for payment source | Admitting: Anesthesiology

## 2018-10-17 ENCOUNTER — Encounter (HOSPITAL_BASED_OUTPATIENT_CLINIC_OR_DEPARTMENT_OTHER): Payer: Self-pay | Admitting: *Deleted

## 2018-10-17 ENCOUNTER — Ambulatory Visit (HOSPITAL_BASED_OUTPATIENT_CLINIC_OR_DEPARTMENT_OTHER)
Admission: RE | Admit: 2018-10-17 | Discharge: 2018-10-17 | Disposition: A | Discharge status: Home / Self Care | Payer: No Typology Code available for payment source | Attending: Orthopedic Surgery | Admitting: Orthopedic Surgery

## 2018-10-17 ENCOUNTER — Other Ambulatory Visit: Payer: Self-pay

## 2018-10-17 ENCOUNTER — Encounter (HOSPITAL_BASED_OUTPATIENT_CLINIC_OR_DEPARTMENT_OTHER)
Admission: RE | Disposition: A | Discharge status: Home / Self Care | Payer: Self-pay | Source: Home / Self Care | Attending: Orthopedic Surgery

## 2018-10-17 DIAGNOSIS — X58XXXA Exposure to other specified factors, initial encounter: Secondary | ICD-10-CM | POA: Insufficient documentation

## 2018-10-17 DIAGNOSIS — M19011 Primary osteoarthritis, right shoulder: Secondary | ICD-10-CM | POA: Diagnosis not present

## 2018-10-17 DIAGNOSIS — M75111 Incomplete rotator cuff tear or rupture of right shoulder, not specified as traumatic: Secondary | ICD-10-CM | POA: Insufficient documentation

## 2018-10-17 DIAGNOSIS — Z8249 Family history of ischemic heart disease and other diseases of the circulatory system: Secondary | ICD-10-CM | POA: Diagnosis not present

## 2018-10-17 DIAGNOSIS — I10 Essential (primary) hypertension: Secondary | ICD-10-CM | POA: Diagnosis not present

## 2018-10-17 DIAGNOSIS — M25811 Other specified joint disorders, right shoulder: Secondary | ICD-10-CM | POA: Diagnosis not present

## 2018-10-17 DIAGNOSIS — E119 Type 2 diabetes mellitus without complications: Secondary | ICD-10-CM | POA: Diagnosis not present

## 2018-10-17 DIAGNOSIS — G8929 Other chronic pain: Secondary | ICD-10-CM | POA: Insufficient documentation

## 2018-10-17 DIAGNOSIS — G4733 Obstructive sleep apnea (adult) (pediatric): Secondary | ICD-10-CM | POA: Insufficient documentation

## 2018-10-17 DIAGNOSIS — Z79899 Other long term (current) drug therapy: Secondary | ICD-10-CM | POA: Diagnosis not present

## 2018-10-17 DIAGNOSIS — S43431A Superior glenoid labrum lesion of right shoulder, initial encounter: Secondary | ICD-10-CM | POA: Insufficient documentation

## 2018-10-17 DIAGNOSIS — M7541 Impingement syndrome of right shoulder: Secondary | ICD-10-CM

## 2018-10-17 DIAGNOSIS — Z7984 Long term (current) use of oral hypoglycemic drugs: Secondary | ICD-10-CM | POA: Insufficient documentation

## 2018-10-17 HISTORY — DX: Type 2 diabetes mellitus without complications: E11.9

## 2018-10-17 HISTORY — DX: Obstructive sleep apnea (adult) (pediatric): G47.33

## 2018-10-17 HISTORY — DX: Obstructive sleep apnea (adult) (pediatric): Z99.89

## 2018-10-17 HISTORY — DX: Personal history of other medical treatment: Z92.89

## 2018-10-17 HISTORY — DX: Presence of spectacles and contact lenses: Z97.3

## 2018-10-17 HISTORY — PX: SHOULDER ARTHROSCOPY WITH ROTATOR CUFF REPAIR: SHX5685

## 2018-10-17 HISTORY — DX: Unspecified osteoarthritis, unspecified site: M19.90

## 2018-10-17 HISTORY — DX: Other specified joint disorders, right shoulder: M25.811

## 2018-10-17 HISTORY — DX: Impingement syndrome of right shoulder: M75.41

## 2018-10-17 LAB — POCT I-STAT, CHEM 8
BUN: 16 mg/dL (ref 6–20)
Calcium, Ion: 1.26 mmol/L (ref 1.15–1.40)
Chloride: 101 mmol/L (ref 98–111)
Creatinine, Ser: 1 mg/dL (ref 0.61–1.24)
Glucose, Bld: 138 mg/dL — ABNORMAL HIGH (ref 70–99)
HCT: 50 % (ref 39.0–52.0)
Hemoglobin: 17 g/dL (ref 13.0–17.0)
Potassium: 4 mmol/L (ref 3.5–5.1)
Sodium: 139 mmol/L (ref 135–145)
TCO2: 26 mmol/L (ref 22–32)

## 2018-10-17 LAB — GLUCOSE, CAPILLARY: Glucose-Capillary: 162 mg/dL — ABNORMAL HIGH (ref 70–99)

## 2018-10-17 SURGERY — ARTHROSCOPY, SHOULDER, WITH ROTATOR CUFF REPAIR
Anesthesia: General | Site: Shoulder | Laterality: Right

## 2018-10-17 MED ORDER — ROCURONIUM BROMIDE 10 MG/ML (PF) SYRINGE
PREFILLED_SYRINGE | INTRAVENOUS | Status: DC | PRN
  Administered 2018-10-17: 70 mg via INTRAVENOUS

## 2018-10-17 MED ORDER — MEPERIDINE HCL 25 MG/ML IJ SOLN
6.2500 mg | INTRAMUSCULAR | Status: DC | PRN
  Filled 2018-10-17: qty 1

## 2018-10-17 MED ORDER — BUPIVACAINE HCL (PF) 0.5 % IJ SOLN
INTRAMUSCULAR | Status: DC | PRN
  Administered 2018-10-17 (×5): 3 mL via PERINEURAL

## 2018-10-17 MED ORDER — FENTANYL CITRATE (PF) 100 MCG/2ML IJ SOLN
INTRAMUSCULAR | Status: DC | PRN
  Administered 2018-10-17: 50 ug via INTRAVENOUS
  Administered 2018-10-17: 100 ug via INTRAVENOUS

## 2018-10-17 MED ORDER — KETOROLAC TROMETHAMINE 30 MG/ML IJ SOLN
30.0000 mg | Freq: Once | INTRAMUSCULAR | Status: DC | PRN
  Filled 2018-10-17: qty 1

## 2018-10-17 MED ORDER — METHOCARBAMOL 500 MG PO TABS: 500.0000 mg | ORAL_TABLET | Freq: Three times a day (TID) | ORAL | 1 refills | Status: DC | PRN

## 2018-10-17 MED ORDER — ONDANSETRON HCL 4 MG/2ML IJ SOLN
4.0000 mg | Freq: Once | INTRAMUSCULAR | Status: DC | PRN
  Filled 2018-10-17: qty 2

## 2018-10-17 MED ORDER — HYDROMORPHONE HCL 2 MG PO TABS: 2.0000 mg | ORAL_TABLET | ORAL | 0 refills | Status: DC | PRN

## 2018-10-17 MED ORDER — BUPIVACAINE LIPOSOME 1.3 % IJ SUSP
INTRAMUSCULAR | Status: DC | PRN
  Administered 2018-10-17 (×5): 2 mL via PERINEURAL

## 2018-10-17 MED ORDER — DEXAMETHASONE SODIUM PHOSPHATE 10 MG/ML IJ SOLN
INTRAMUSCULAR | Status: AC
  Filled 2018-10-17: qty 1

## 2018-10-17 MED ORDER — PROPOFOL 10 MG/ML IV BOLUS
INTRAVENOUS | Status: AC
  Filled 2018-10-17: qty 40

## 2018-10-17 MED ORDER — ONDANSETRON HCL 4 MG PO TABS: 4.0000 mg | ORAL_TABLET | Freq: Three times a day (TID) | ORAL | 0 refills | Status: DC | PRN

## 2018-10-17 MED ORDER — IBUPROFEN 200 MG PO TABS
200.0000 mg | ORAL_TABLET | Freq: Four times a day (QID) | ORAL | Status: DC | PRN
  Filled 2018-10-17: qty 2

## 2018-10-17 MED ORDER — IBUPROFEN 100 MG/5ML PO SUSP
200.0000 mg | Freq: Four times a day (QID) | ORAL | Status: DC | PRN
  Filled 2018-10-17: qty 30

## 2018-10-17 MED ORDER — FENTANYL CITRATE (PF) 100 MCG/2ML IJ SOLN
50.0000 ug | INTRAMUSCULAR | Status: DC | PRN
  Administered 2018-10-17 (×2): 50 ug via INTRAVENOUS
  Filled 2018-10-17: qty 1

## 2018-10-17 MED ORDER — MIDAZOLAM HCL 5 MG/5ML IJ SOLN
INTRAMUSCULAR | Status: DC | PRN
  Administered 2018-10-17: 2 mg via INTRAVENOUS

## 2018-10-17 MED ORDER — MIDAZOLAM HCL 2 MG/2ML IJ SOLN
INTRAMUSCULAR | Status: AC
  Filled 2018-10-17: qty 2

## 2018-10-17 MED ORDER — PROPOFOL 10 MG/ML IV BOLUS
INTRAVENOUS | Status: DC | PRN
  Administered 2018-10-17: 300 mg via INTRAVENOUS

## 2018-10-17 MED ORDER — CEFAZOLIN SODIUM-DEXTROSE 1-4 GM/50ML-% IV SOLN
INTRAVENOUS | Status: AC
  Filled 2018-10-17: qty 50

## 2018-10-17 MED ORDER — NAPROXEN 500 MG PO TABS: 500.0000 mg | ORAL_TABLET | Freq: Two times a day (BID) | ORAL | 1 refills | Status: DC

## 2018-10-17 MED ORDER — ONDANSETRON HCL 4 MG/2ML IJ SOLN
INTRAMUSCULAR | Status: DC | PRN
  Administered 2018-10-17: 4 mg via INTRAVENOUS

## 2018-10-17 MED ORDER — SODIUM CHLORIDE 0.9 % IR SOLN
Status: DC | PRN
  Administered 2018-10-17 (×2): 3000 mL

## 2018-10-17 MED ORDER — LACTATED RINGERS IV SOLN
INTRAVENOUS | Status: DC
  Administered 2018-10-17 (×2): via INTRAVENOUS
  Filled 2018-10-17: qty 1000

## 2018-10-17 MED ORDER — ONDANSETRON HCL 4 MG/2ML IJ SOLN
INTRAMUSCULAR | Status: AC
  Filled 2018-10-17: qty 2

## 2018-10-17 MED ORDER — FENTANYL CITRATE (PF) 250 MCG/5ML IJ SOLN
INTRAMUSCULAR | Status: AC
  Filled 2018-10-17: qty 5

## 2018-10-17 MED ORDER — FENTANYL CITRATE (PF) 100 MCG/2ML IJ SOLN
INTRAMUSCULAR | Status: AC
  Filled 2018-10-17: qty 2

## 2018-10-17 MED ORDER — SUGAMMADEX SODIUM 500 MG/5ML IV SOLN
INTRAVENOUS | Status: DC | PRN
  Administered 2018-10-17: 500 mg via INTRAVENOUS

## 2018-10-17 MED ORDER — FENTANYL CITRATE (PF) 100 MCG/2ML IJ SOLN
25.0000 ug | INTRAMUSCULAR | Status: DC | PRN
  Filled 2018-10-17: qty 1

## 2018-10-17 MED ORDER — CHLORHEXIDINE GLUCONATE 4 % EX LIQD
60.0000 mL | Freq: Once | CUTANEOUS | Status: DC
  Filled 2018-10-17: qty 118

## 2018-10-17 MED ORDER — KETOROLAC TROMETHAMINE 30 MG/ML IJ SOLN
INTRAMUSCULAR | Status: AC
  Filled 2018-10-17: qty 1

## 2018-10-17 MED ORDER — PHENYLEPHRINE HCL (PRESSORS) 10 MG/ML IV SOLN
INTRAVENOUS | Status: DC | PRN
  Administered 2018-10-17 (×2): 80 ug via INTRAVENOUS

## 2018-10-17 MED ORDER — MIDAZOLAM HCL 2 MG/2ML IJ SOLN
1.0000 mg | INTRAMUSCULAR | Status: DC | PRN
  Administered 2018-10-17: 1 mg via INTRAVENOUS
  Administered 2018-10-17: 2 mg via INTRAVENOUS
  Filled 2018-10-17: qty 2

## 2018-10-17 MED ORDER — LIDOCAINE 2% (20 MG/ML) 5 ML SYRINGE
INTRAMUSCULAR | Status: DC | PRN
  Administered 2018-10-17: 100 mg via INTRAVENOUS

## 2018-10-17 MED ORDER — CEFAZOLIN SODIUM-DEXTROSE 2-4 GM/100ML-% IV SOLN
INTRAVENOUS | Status: AC
  Filled 2018-10-17: qty 100

## 2018-10-17 MED ORDER — DEXAMETHASONE SODIUM PHOSPHATE 10 MG/ML IJ SOLN
INTRAMUSCULAR | Status: DC | PRN
  Administered 2018-10-17: 10 mg via INTRAVENOUS

## 2018-10-17 SURGICAL SUPPLY — 62 items
BLADE 11 SAFETY STRL DISP (BLADE) ×2 IMPLANT
BLADE EXCALIBUR 4.0X13 (MISCELLANEOUS) ×2 IMPLANT
BOOTIES KNEE HIGH SLOAN (MISCELLANEOUS) ×4 IMPLANT
BURR OVAL 8 FLU 4.0X13 (MISCELLANEOUS) ×2 IMPLANT
CANNULA ACUFLEX KIT 5X76 (CANNULA) ×2 IMPLANT
CANNULA DRILOCK 5.0X75 (CANNULA) IMPLANT
CANNULA TWIST IN 8.25X7CM (CANNULA) IMPLANT
CHLORAPREP W/TINT 26 (MISCELLANEOUS) ×4 IMPLANT
CONNECTOR 5 IN 1 STRAIGHT STRL (MISCELLANEOUS) ×2 IMPLANT
COOLER ICEMAN CLASSIC (MISCELLANEOUS) IMPLANT
COVER WAND RF STERILE (DRAPES) ×2 IMPLANT
DISSECTOR  3.8MM X 13CM (MISCELLANEOUS) ×1
DISSECTOR 3.8MM X 13CM (MISCELLANEOUS) ×1 IMPLANT
DRAPE INCISE 23X17 IOBAN STRL (DRAPES) ×1
DRAPE INCISE 23X17 STRL (DRAPES) ×1 IMPLANT
DRAPE INCISE IOBAN 23X17 STRL (DRAPES) ×1 IMPLANT
DRAPE INCISE IOBAN 66X45 STRL (DRAPES) ×2 IMPLANT
DRAPE ORTHO SPLIT 77X108 STRL (DRAPES) ×2
DRAPE STERI 35X30 U-POUCH (DRAPES) ×2 IMPLANT
DRAPE SURG 17X11 SM STRL (DRAPES) ×2 IMPLANT
DRAPE SURG ORHT 6 SPLT 77X108 (DRAPES) ×2 IMPLANT
DRAPE U-SHAPE 47X51 STRL (DRAPES) ×2 IMPLANT
DRSG PAD ABDOMINAL 8X10 ST (GAUZE/BANDAGES/DRESSINGS) ×4 IMPLANT
GAUZE SPONGE 4X4 12PLY STRL (GAUZE/BANDAGES/DRESSINGS) ×1 IMPLANT
GLOVE BIO SURGEON STRL SZ7.5 (GLOVE) ×2 IMPLANT
GLOVE BIO SURGEON STRL SZ8 (GLOVE) ×2 IMPLANT
GLOVE SS BIOGEL STRL SZ 7 (GLOVE) ×1 IMPLANT
GLOVE SS BIOGEL STRL SZ 7.5 (GLOVE) ×1 IMPLANT
GLOVE SUPERSENSE BIOGEL SZ 7 (GLOVE) ×1
GLOVE SUPERSENSE BIOGEL SZ 7.5 (GLOVE) ×1
GOWN STRL REUS W/TWL LRG LVL3 (GOWN DISPOSABLE) ×4 IMPLANT
KIT SHOULDER TRACTION (DRAPES) ×2 IMPLANT
KIT TURNOVER CYSTO (KITS) IMPLANT
MANIFOLD NEPTUNE II (INSTRUMENTS) ×2 IMPLANT
NDL SCORPION MULTI FIRE (NEEDLE) IMPLANT
NDL SPNL 18GX3.5 QUINCKE PK (NEEDLE) ×1 IMPLANT
NEEDLE SCORPION MULTI FIRE (NEEDLE) IMPLANT
NEEDLE SPNL 18GX3.5 QUINCKE PK (NEEDLE) ×2 IMPLANT
NS IRRIG 1000ML POUR BTL (IV SOLUTION) ×2 IMPLANT
PACK BASIN DAY SURGERY FS (CUSTOM PROCEDURE TRAY) ×2 IMPLANT
PACK SHOULDER (CUSTOM PROCEDURE TRAY) ×2 IMPLANT
PAD ABD 8X10 STRL (GAUZE/BANDAGES/DRESSINGS) ×1 IMPLANT
PAD ARMBOARD 7.5X6 YLW CONV (MISCELLANEOUS) ×4 IMPLANT
PAD COLD SHLDR WRAP-ON (PAD) IMPLANT
PROBE APOLLO 90XL (SURGICAL WAND) ×2 IMPLANT
RESTRAINT HEAD UNIVERSAL NS (MISCELLANEOUS) ×2 IMPLANT
SLING ARM FOAM STRAP LRG (SOFTGOODS) ×2 IMPLANT
SLING ARM FOAM STRAP MED (SOFTGOODS) IMPLANT
SLING ARM FOAM STRAP XLG (SOFTGOODS) ×1 IMPLANT
SPONGE LAP 4X18 RFD (DISPOSABLE) IMPLANT
STRIP CLOSURE SKIN 1/2X4 (GAUZE/BANDAGES/DRESSINGS) ×2 IMPLANT
SUT MNCRL AB 3-0 PS2 18 (SUTURE) ×2 IMPLANT
SUT PDS AB 0 CT 36 (SUTURE) IMPLANT
SUT TIGER TAPE 7 IN WHITE (SUTURE) IMPLANT
SYR 20CC LL (SYRINGE) ×2 IMPLANT
TAPE FIBER 2MM 7IN #2 BLUE (SUTURE) IMPLANT
TAPE PAPER 2X10 WHT MICROPORE (GAUZE/BANDAGES/DRESSINGS) ×1 IMPLANT
TAPE PAPER 3X10 WHT MICROPORE (GAUZE/BANDAGES/DRESSINGS) ×2 IMPLANT
TOWEL OR 17X26 10 PK STRL BLUE (TOWEL DISPOSABLE) ×4 IMPLANT
TUBE CONNECTING 12X1/4 (SUCTIONS) ×3 IMPLANT
TUBING ARTHROSCOPY IRRIG 16FT (MISCELLANEOUS) ×2 IMPLANT
WATER STERILE IRR 1000ML POUR (IV SOLUTION) ×2 IMPLANT

## 2018-10-17 NOTE — Anesthesia Postprocedure Evaluation (Signed)
Anesthesia Post Note  Patient: Adam Hahn  Procedure(s) Performed: Right shoulder arthroscopy with subacromial decompression, distal clavicle resection,  rotator cuff DEBRIEDMENT, LABRIAL DEBRIDMENT (Right Shoulder)     Patient location during evaluation: PACU Anesthesia Type: General Level of consciousness: awake and sedated Pain management: pain level controlled Vital Signs Assessment: post-procedure vital signs reviewed and stable Respiratory status: spontaneous breathing Cardiovascular status: stable Postop Assessment: no apparent nausea or vomiting Anesthetic complications: no    Last Vitals:  Vitals:   10/17/18 1345 10/17/18 1400  BP: 111/68 112/70  Pulse: 85 83  Resp: (!) 26 (!) 21  Temp:  37.1 C  SpO2: 92% 93%    Last Pain:  Vitals:   10/17/18 1400  TempSrc:   PainSc: 0-No pain   Pain Goal:                   Huston Foley

## 2018-10-17 NOTE — Transfer of Care (Signed)
Immediate Anesthesia Transfer of Care Note  Patient: Adam Hahn  Procedure(s) Performed: Right shoulder arthroscopy with subacromial decompression, distal clavicle resection,  rotator cuff DEBRIEDMENT, LABRIAL DEBRIDMENT (Right Shoulder)  Patient Location: PACU  Anesthesia Type:General  Level of Consciousness: awake, alert  and oriented  Airway & Oxygen Therapy: Patient Spontanous Breathing and Patient connected to face mask oxygen  Post-op Assessment: Report given to RN, Post -op Vital signs reviewed and stable and Patient moving all extremities X 4  Post vital signs: Reviewed and stable  Last Vitals:  Vitals Value Taken Time  BP 149/76 10/17/18 1146  Temp    Pulse 86 10/17/18 1150  Resp 20 10/17/18 1150  SpO2 92 % 10/17/18 1150  Vitals shown include unvalidated device data.  Last Pain:  Vitals:   10/17/18 0716  TempSrc: Oral         Complications: No apparent anesthesia complications

## 2018-10-17 NOTE — Discharge Instructions (Signed)
Metta Clines. Supple, M.D., F.A.A.O.S. [facility] Specializing in Arthroscopic and Reconstructive Surgery of the Shoulder [phone] [address] [place], [place] [address] - Fax [phone]   POST-OP SHOULDER ARTHROSCOPY INSTRUCTIONS  1. Call the office at [phone] to schedule your first post-op appointment 7-10 days from the date of your surgery.  2. Leave the steri-strips in place over your incisions when performing dressing changes and showering. You may remove your dressings and begin showering 72 hours from surgery. You can expect drainage that is clear to bloody in nature that occasionally will soak through your dressings. If this occurs go ahead and perform a dressing change. The drainage should lessen daily and when there is no drainage from your incisions feel free to go without a dressing.  3. Wear your sling for comfort. You may come out of your sling for ad lib activity and even decide not to use the sling at all. If you find you are more comfortable in your sling, make sure you come out of your sling at least 3-4 times a day to do the exercises that are included below.  4. Range of motion to your elbow, wrist, and hand are encouraged 3-5 times daily. Exercise to your hand and fingers helps to reduce swelling you may experience.  5. Utilize ice to the shoulder 3-4 times minimum a day and additionally if you are experiencing pain.  6. You may drive when safely off narcotics and muscle relaxants.  7.Pain control following an exparel block  To help control your post-operative pain you received a nerve block  performed with Exparel which is a long acting anesthetic (numbing agent) which can provide pain relief and sensations of numbness (and relief of pain) in the operative shoulder and arm for up to 3 days. Sometimes it provides mixed relief, meaning you may still have numbness in certain areas of the arm but can still be able to move  parts of that arm,  hand, and fingers. We recommend that your prescribed pain medications  be used as needed. We do not feel it is necessary to "pre medicate" and "stay ahead" of pain.  Taking narcotic pain medications when you are not having any pain can lead to unnecessary and potentially dangerous side effects.    8. Pain medications can produce constipation along with their use. If you experience this, the use of an over the counter stool softener or laxative daily is recommended.   9. For additional questions or concerns, please do not hesitate to call the office. If after hours there is an answering service to forward your concerns to the physician on call.   POST-OP EXERCISES  The pendulum exercises should be performed while bending at the waist as far over as possible thereby letting gravity do the work for you.  Range of Motion Exercises: Pendulum (circular)  Repeat 20 times. Do 3 sessions per day.     Range of Motion Exercises: Pendulum (side-to-side)  Repeat 20 times. Do 3 sessions per day.    Range of Motion Exercises (self-stretching activities):  Slide arm up wall with palm toward you, moving closer to the wall. Hold for 5 seconds.  Repeat 10 times. Do 3 sessions per day.       Post Anesthesia Home Care Instructions  Activity: Get plenty of rest for the remainder of the day. A responsible individual must stay with you for 24 hours following the procedure.  For the next 24 hours, DO NOT: -Drive a  car -Paediatric nurse -Drink alcoholic beverages -Take any medication unless instructed by your physician -Make any legal decisions or sign important papers.  Meals: Start with liquid foods such as gelatin or soup. Progress to regular foods as tolerated. Avoid greasy, spicy, heavy foods. If nausea and/or vomiting occur, drink only clear liquids until the nausea and/or vomiting subsides. Call your physician if vomiting continues.  Special Instructions/Symptoms: Your throat may feel  dry or sore from the anesthesia or the breathing tube placed in your throat during surgery. If this causes discomfort, gargle with warm salt water. The discomfort should disappear within 24 hours.  If you had a scopolamine patch placed behind your ear for the management of post- operative nausea and/or vomiting:  1. The medication in the patch is effective for 72 hours, after which it should be removed.  Wrap patch in a tissue and discard in the trash. Wash hands thoroughly with soap and water. 2. You may remove the patch earlier than 72 hours if you experience unpleasant side effects which may include dry mouth, dizziness or visual disturbances. 3. Avoid touching the patch. Wash your hands with soap and water after contact with the patch.    Information for Discharge Teaching: EXPAREL (bupivacaine liposome injectable suspension)   Your surgeon or anesthesiologist gave you EXPAREL(bupivacaine) to help control your pain after surgery.   EXPAREL is a local anesthetic that provides pain relief by numbing the tissue around the surgical site.  EXPAREL is designed to release pain medication over time and can control pain for up to 72 hours.  Depending on how you respond to EXPAREL, you may require less pain medication during your recovery.  Possible side effects:  Temporary loss of sensation or ability to move in the area where bupivacaine was injected.  Nausea, vomiting, constipation  Rarely, numbness and tingling in your mouth or lips, lightheadedness, or anxiety may occur.  Call your doctor right away if you think you may be experiencing any of these sensations, or if you have other questions regarding possible side effects.  Follow all other discharge instructions given to you by your surgeon or nurse. Eat a healthy diet and drink plenty of water or other fluids.  If you return to the hospital for any reason within 96 hours following the administration of EXPAREL, it is important for  health care providers to know that you have received this anesthetic. A teal colored band has been placed on your arm with the date, time and amount of EXPAREL you have received in order to alert and inform your health care providers. Please leave this armband in place for the full 96 hours following administration, and then you may remove the band.

## 2018-10-17 NOTE — Anesthesia Procedure Notes (Signed)
Procedure Name: Intubation Date/Time: 10/17/2018 10:20 AM Performed by: Lyn Hollingshead, MD Pre-anesthesia Checklist: Patient identified, Patient being monitored, Timeout performed, Emergency Drugs available and Suction available Patient Re-evaluated:Patient Re-evaluated prior to induction Oxygen Delivery Method: Circle System Utilized Preoxygenation: Pre-oxygenation with 100% oxygen Induction Type: IV induction Ventilation: Mask ventilation without difficulty Laryngoscope Size: 3 and Miller Grade View: Grade II Tube type: Oral Tube size: 7.0 mm Number of attempts: 1 Airway Equipment and Method: stylet Placement Confirmation: ETT inserted through vocal cords under direct vision,  positive ETCO2 and breath sounds checked- equal and bilateral Secured at: 21 cm Tube secured with: Tape Dental Injury: Teeth and Oropharynx as per pre-operative assessment

## 2018-10-17 NOTE — Progress Notes (Signed)
AssistedDr. Hatchett with right, ultrasound guided, supraclavicular block. Side rails up, monitors on throughout procedure. See vital signs in flow sheet. Tolerated Procedure well.  

## 2018-10-17 NOTE — Op Note (Signed)
10/17/2018  11:30 AM  PATIENT:   Adam Hahn  53 y.o. male  PRE-OPERATIVE DIAGNOSIS:  right shoulder chronic impingemtn, acromioclavicular osteoarthritis, partial rotator cuff tear  POST-OPERATIVE DIAGNOSIS: Same as preoperative diagnosis with additional finding of degenerative labral tear and partial articular rotator cuff tear  PROCEDURE:  1.  Right shoulder examination under anesthesia  2.  Right shoulder glenohumeral joint diagnostic arthroscopy  3.  Debridement of degenerative labral tear  4.  Debridement of partial articular rotator cuff tear  5.  Arthroscopic subacromial decompression and bursectomy  6.  Arthroscopic distal clavicle resection.  SURGEON:  Marin Shutter M.D.  ASSISTANTS: Jenetta Loges, PA-C  ANESTHESIA:   General endotracheal and interscalene block with Exparel  EBL: Minimal  SPECIMEN: None  Drains: None   PATIENT DISPOSITION:  PACU - hemodynamically stable.    PLAN OF CARE: Discharge to home after PACU  Brief history:  Adam Hahn is a 53 year old gentleman whose had chronic bilateral shoulder pain right more symptomatic of the left related to an impingement syndrome.  Preoperative plain films and MRI scan confirmed advanced AC joint arthritis as well as bony impingement.  Due to his ongoing pain and functional imitations he is brought to the operating this time for planned right shoulder arthroscopy as described below.  Preoperatively had counseled Adam Hahn regarding treatment options as well as the potential risks versus benefits thereof.  Possible surgical complications were reviewed including bleeding, infection, neurovascular injury, persistent pain, loss of motion, anesthetic complication, and possible need for additional surgery.  He understands and accepts and agrees with her planned procedure.  Procedure in detail:  After undergoing routine preop evaluation patient received prophylactic antibiotics and interscalene block  with Exparel was established in the holding her by the anesthesia department.  Placed supine on the operating table and underwent the smooth induction of a general endotracheal anesthesia.  Turned to the left lateral decubitus position on a beanbag and appropriately padded and protected.  Right shoulder girdle right shoulder examination under esthesia demonstrated full motion with no instability patterns.  Right arm was then suspended at 70 degrees of abduction with 15 pounds of traction.  The right shoulder girdle region was sterilely prepped and draped in standard fashion.  Timeout was called.  A posterior portal established into the glenohumeral joint and an anterior portal established under direct visualization.  The articular surfaces were found to be in good condition.  There was moderate synovitis and a limited synovectomy was performed anteriorly and superiorly.  Extensive degenerative labral tearing was noted anteriorly superiorly and posteriorly in all these areas were debrided with a shaver to a stable margin.  Capsular volume was within normal limits.  Biceps tendon was of normal caliber with no evidence for proximal or distal instability.  Rotator cuff showed an obvious partial articular sided tear of the distal supraspinatus that I would estimate no more than 5 to 10% of the thickness of the tendon.  This area was debrided with a shaver to healthy tissue.  Remaining inspection of the glenohumeral joint showed no additional pathologies.  Fluid and instruments were removed.  The arm was then dropped down to 30 degrees of abduction with the arthroscope introduced in the subacromial space at the posterior portal and a direct lateral portal established in the subacromial space.  Abundant proliferative bursal tissue and multiple adhesions were encountered and these were all divided and excised, and a shaver and Stryker wand.  The wand was then used to remove the  periosteum from the undersurface of the  anterior half of the acromion and a subacromial decompression was performed with a bur creating a type I morphology.  A portal was then established directly anterior to the distal clavicle and the distal clavicle resection was performed with a bur.  Care was taken to confirm visualization of the entire circumference of the distal clavicle to ensure adequate removal of bone.  Approximate millimeters was excised.  We then completed the subacromial/subdeltoid bursectomy.  The bursal surface of the rotator cuff was carefully inspected and probed.  Final hemostasis was obtained.  Fluid and instruments were removed.  The portals were closed with a Monocryl and a Steri-Strip.  A dry dressing taped about the right shoulder right arm placed into a sling and the patient awakened, extubated, taken recovery in stable addition.  Jenetta Loges, PA-C was used as an Environmental consultant throughout this case essential for help with positioning the patient, positioning of the extremity, tissue manipulation, wound closure, and intraoperative decision-making.  Metta Clines Jolan Upchurch MD   Contact # [phone]

## 2018-10-17 NOTE — H&P (Signed)
Adam Hahn    Chief Complaint: right shoulder chronic impingemtn, acromioclavicular osteoarthritis, partial rotator cuff tear HPI: The patient is a 53 y.o. male with chronic right shoulder pain and impingement syndrome refractory to prolonged attempts at conservative management.  Past Medical History:  Diagnosis Date  . Allergic rhinitis   . Arthritis    knees,  right shoulder ac joint  . History of exercise stress test 10-09-2001 in epic   normal  . HTN (hypertension)   . OSA on CPAP   . Shoulder impingement, right   . Type 2 diabetes mellitus ([place])    followed by pcp  . Wears glasses     Past Surgical History:  Procedure Laterality Date  . KNEE SURGERY Left x4  last one 1996  . LAPAROSCOPIC CHOLECYSTECTOMY  09-20-2007  '@[facility]'     Family History  Problem Relation Age of Onset  . Hypertension Father   . Asthma Mother   . Allergic rhinitis Neg Hx   . Angioedema Neg Hx   . Atopy Neg Hx   . Eczema Neg Hx   . Immunodeficiency Neg Hx   . Urticaria Neg Hx     Social History:  reports that he has never smoked. He has never used smokeless tobacco. He reports current alcohol use. He reports that he does not use drugs.   Medications Prior to Admission  Medication Sig Dispense Refill  . ergocalciferol (VITAMIN D2) 1.25 MG (50000 UT) capsule Take 50,000 Units by mouth once a week.    . metFORMIN (GLUCOPHAGE-XR) 500 MG 24 hr tablet Take 100 mg by mouth daily with breakfast.    . verapamil (CALAN-SR) 240 MG CR tablet Take 1 tablet (240 mg total) by mouth daily. (Patient taking differently: Take 240 mg by mouth daily. ) 30 tablet 5  . blood glucose meter kit and supplies KIT Dispense based on patient and insurance preference. Test BG once daily.(FOR ICD-10 E11.9) 1 each 2     Physical Exam: Right shoulder demonstrates a painful and guarded range of motion.  Examination otherwise as documented at his previous office visit.  Vitals  Temp:  [98.1 F (36.7 C)] 98.1 F (36.7 C)  (06/16 0716) Pulse Rate:  [71-82] 71 (06/16 1005) Resp:  [12-23] 23 (06/16 1005) BP: (132-147)/(86-105) 140/89 (06/16 1000) SpO2:  [98 %-100 %] 99 % (06/16 1005) Weight:  [133.4 kg-133.5 kg] 133.4 kg (06/16 0739)  Assessment/Plan  Impression: right shoulder chronic impingemtn, acromioclavicular osteoarthritis, partial rotator cuff tear  Plan of Action: Procedure(s): Right shoulder arthroscopy with subacromial decompression, distal clavicle resection, possible rotator cuff tear  Adam Hahn Adam Hahn Adam Hahn 10/17/2018, 10:08 AM Contact # [phone]

## 2018-10-17 NOTE — Anesthesia Procedure Notes (Signed)
Anesthesia Regional Block: Interscalene brachial plexus block   Pre-Anesthetic Checklist: ,, timeout performed, Correct Patient, Correct Site, Correct Laterality, Correct Procedure, Correct Position, site marked, Risks and benefits discussed,  Surgical consent,  Pre-op evaluation,  At surgeon's request and post-op pain management  Laterality: Right and Upper  Prep: chloraprep       Needles:  Injection technique: Single-shot  Needle Type: Echogenic Stimulator Needle     Needle Length: 10cm  Needle Gauge: 21     Additional Needles:   Procedures:,,,, ultrasound used (permanent image in chart),,,,  Narrative:  Start time: 10/17/2018 8:30 AM End time: 10/17/2018 8:45 AM Injection made incrementally with aspirations every 5 mL.  Performed by: Personally  Anesthesiologist: Lyn Hollingshead, MD

## 2018-10-18 ENCOUNTER — Encounter (HOSPITAL_BASED_OUTPATIENT_CLINIC_OR_DEPARTMENT_OTHER): Payer: Self-pay | Admitting: Orthopedic Surgery

## 2018-10-25 ENCOUNTER — Other Ambulatory Visit: Payer: Self-pay

## 2018-10-25 ENCOUNTER — Telehealth (HOSPITAL_COMMUNITY): Payer: Self-pay

## 2018-10-25 ENCOUNTER — Encounter (HOSPITAL_COMMUNITY): Payer: Self-pay

## 2018-10-25 ENCOUNTER — Ambulatory Visit (HOSPITAL_COMMUNITY): Payer: No Typology Code available for payment source | Attending: Orthopedic Surgery

## 2018-10-25 DIAGNOSIS — R29898 Other symptoms and signs involving the musculoskeletal system: Secondary | ICD-10-CM | POA: Diagnosis present

## 2018-10-25 DIAGNOSIS — M25511 Pain in right shoulder: Secondary | ICD-10-CM | POA: Diagnosis not present

## 2018-10-25 DIAGNOSIS — M25611 Stiffness of right shoulder, not elsewhere classified: Secondary | ICD-10-CM | POA: Insufficient documentation

## 2018-10-25 NOTE — Telephone Encounter (Signed)
L/m that a time change was made on this appointment 12/19/2018  to 9:30am. He said he might have to change some b/c he had to go back to see MD at [facility] and wasn't sure when that would be. NF 10/25/2018

## 2018-10-25 NOTE — Patient Instructions (Signed)
TOWEL SLIDES COMPLETE FOR 1-3 MINUTES, 3-5 TIMES PER DAY  SHOULDER: Flexion On Table   Place hands on table, elbows straight. Move hips away from body. Press hands down into table. Hold ___ seconds. ___ reps per set, ___ sets per day, ___ days per week  Abduction (Passive)   With arm out to side, resting on table, lower head toward arm, keeping trunk away from table. Hold ____ seconds. Repeat ____ times. Do ____ sessions per day.  Copyright  VHI. All rights reserved.     Internal Rotation (Assistive)   Seated with elbow bent at right angle and held against side, slide arm on table surface in an inward arc. Repeat ____ times. Do ____ sessions per day. Activity: Use this motion to brush crumbs off the table.  Copyright  VHI. All rights reserved.    COMPLETE PENDULUM EXERCISES FOR 30 SECONDS TO A MINUTE EACH, 3-5 TIMES PER DAY. ROM: Pendulum (Side-to-Side)   http://orth.exer.us/792   Copyright  VHI. All rights reserved.  Pendulum Forward/Back   Bend forward 90 at waist, using table for support. Rock body forward and back to swing arm. Repeat ____ times. Do ____ sessions per day.  Copyright  VHI. All rights reserved.  Pendulum Circular   Bend forward 90 at waist, leaning on table for support. Rock body in a circular pattern to move arm clockwise ____ times then counterclockwise ____ times. Do ____ sessions per day.  Copyright  VHI. All rights reserved.  AROM: Wrist Extension   With right palm down, bend wrist up. Repeat 10____ times per set. Do ____ sets per session. Do __3__ sessions per day.  Copyright  VHI. All rights reserved.   AROM: Wrist Flexion   With right palm up, bend wrist up. Repeat ___10_ times per set. Do ____ sets per session. Do __3__ sessions per day.  Copyright  VHI. All rights reserved.   AROM: Forearm Pronation / Supination   With right arm in handshake position, slowly rotate palm down until stretch is felt. Relax. Then  rotate palm up until stretch is felt. Repeat __10__ times per set. Do ____ sets per session. Do __3__ sessions per day.  Copyright  VHI. All rights reserved.   AFlexion (Passive)  OR    Use other hand to bend elbow, with thumb toward same shoulder. Do NOT force this motion. Hold ____ seconds. Repeat ____ times. Do ____ sessions per day.

## 2018-10-25 NOTE — Therapy (Signed)
[place], [place], [address] Phone: [phone]   Fax:  [phone]  Occupational Therapy Evaluation  Patient Details  Name: Adam Hahn MRN: [id number] Date of Birth: [birth year] Referring Provider (OT): Dr. Justice Britain   Encounter Date: 10/25/2018  OT End of Session - 10/25/18 1255    Visit Number  1    Number of Visits  20    Date for OT Re-Evaluation  12/06/18   mini reassess: 11/22/18   Authorization Type  Patient is not filing with [employer] ($30 co pay- 60 visit limit). He is only filing with [facility].    Authorization Time Period  [facility] approved 15 visits. Request additional visits when needed.    Authorization - Visit Number  1    Authorization - Number of Visits  15    OT Start Time  [phone]    OT Stop Time  1000    OT Time Calculation (min)  33 min    Activity Tolerance  Patient tolerated treatment well;Patient limited by pain    Behavior During Therapy  [facility] for tasks assessed/performed       Past Medical History:  Diagnosis Date  . Allergic rhinitis   . Arthritis    knees,  right shoulder ac joint  . History of exercise stress test 10-09-2001 in epic   normal  . HTN (hypertension)   . OSA on CPAP   . Shoulder impingement, right   . Type 2 diabetes mellitus ([place])    followed by pcp  . Wears glasses     Past Surgical History:  Procedure Laterality Date  . KNEE SURGERY Left x4  last one 1996  . LAPAROSCOPIC CHOLECYSTECTOMY  09-20-2007  @[facility]   . SHOULDER ARTHROSCOPY WITH ROTATOR CUFF REPAIR Right 10/17/2018   Procedure: Right shoulder arthroscopy with subacromial decompression, distal clavicle resection,  rotator cuff DEBRIEDMENT, LABRIAL DEBRIDMENT;  Surgeon: Justice Britain, MD;  Location: [facility];  Service: Orthopedics;  Laterality: Right;  147min    There were no vitals filed for this visit.  Subjective Assessment - 10/25/18 1250    Subjective   S: I was really hurting last  night. i woke up with pain at 4am.    Pertinent History  Patient is a 53 y/o male S/P right shoulder SA, SAD, DCR which occurred on 10/13/18. Patient arrives to clinic with a sling on RUE. Dr. Onnie Graham has referred patient to occupational therapy for evaluation and treatment.    Special Tests  FOTO: 4/100    Patient Stated Goals  To return to work.    Currently in Pain?  Yes    Pain Score  8     Pain Location  Shoulder    Pain Orientation  Right    Pain Descriptors / Indicators  Aching;Constant    Pain Type  Surgical pain    Pain Radiating Towards  N/A    Pain Onset  Yesterday    Pain Frequency  Constant    Aggravating Factors   Decrease in pain medication.    Pain Relieving Factors  Pain medication, ice    Effect of Pain on Daily Activities  Pt is unable to utilize his RUE for any daily task at this time.        [facility] OT Assessment - 10/25/18 0929      Assessment   Medical Diagnosis  Right shoulder SA, SAD, DCR    Referring Provider (OT)  Dr. Justice Britain  Onset Date/Surgical Date  10/17/18    Hand Dominance  Right    Next MD Visit  10/27/2018    Prior Therapy  None for this surgery      Precautions   Precautions  Shoulder    Type of Shoulder Precautions  P/ROM only for 4 weeks (11/14/18) .     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    Leisure  Enjoys to read.      ADL   ADL comments  Pt is unable to use his RUE for any daily tasks at this  time.       Mobility   Mobility Status  Independent      Written Expression   Dominant Hand  Right      Vision - History   Baseline Vision  Wears glasses all the time   for driving     Cognition   Overall Cognitive Status  Within Functional Limits for tasks assessed      Observation/Other Assessments   Focus on Therapeutic Outcomes (FOTO)   4/100      Posture/Postural Control   Posture/Postural Control  Postural limitations    Postural Limitations  Rounded Shoulders;Forward head      ROM / Strength   AROM / PROM / Strength  AROM;Strength;PROM      Palpation   Palpation comment  Max fascial restrictions in right upper arm, trapezius, and scapularis region.       AROM   Overall AROM   Unable to assess;Due to precautions      PROM   Overall PROM Comments  Assessed supine. IR/er adducted    PROM Assessment Site  Shoulder    Right/Left Shoulder  Right    Right Shoulder Flexion  45 Degrees    Right Shoulder ABduction  74 Degrees    Right Shoulder Internal Rotation  90 Degrees    Right Shoulder External Rotation  32 Degrees      Strength   Overall Strength  Unable to assess;Due to precautions                        OT Short Term Goals - 10/25/18 1304      OT SHORT TERM GOAL #1   Title  Patient will be educated and independent with HEP to increase functional use of his RUE be able to return to utilizing it as his dominant extremity for all daily tasks.    Time  6    Period  Weeks    Status  New    Target Date  12/06/18      OT SHORT TERM GOAL #2   Title  Patient will increase RUE P/ROM to [facility] in order to increase ability to bath himself and wash his face without assistance.    Time  6    Period  Weeks    Status  New      OT SHORT TERM GOAL #3   Title  Patient will increase RUE strength to 3/5 in order to complete waist level activities without difficulty.    Time  6    Period  Weeks    Status  New      OT SHORT TERM GOAL #4   Title  Patient  will decrease pain level to 6/10 or less during functional use and low level reaching tasks.    Time  6    Period  Weeks    Status  New      OT SHORT TERM GOAL #5   Title  Patient will decrease fascial restrictions to mod amount or less in order to increase functional mobility needed in the RUE to complete low level reaching.    Time  6    Period  Weeks    Status  New        OT Long Term Goals - 10/25/18 1309      OT LONG TERM GOAL #1   Title  Patient will return to highest level of independence using his RUE for all work and daily tasks while progressing as tolerated.    Time  12    Period  Weeks    Status  New    Target Date  01/17/19      OT LONG TERM GOAL #2   Title  Patient will increase A/ROM of his RUE to [facility] in order to complete reaching tasks above his head with no difficulty.    Time  12    Period  Weeks    Status  New      OT LONG TERM GOAL #3   Title  Patient will increase his RUE strength to 4+/5 in order to return to work and begin his usual job requirements without restrictions.    Time  12    Period  Weeks    Status  New      OT LONG TERM GOAL #4   Title  Patient will decrease his fascial restrictions to min amount or less in order to increase functional mobility needed to complete reaching tasks.    Time  12    Period  Weeks    Status  New      OT LONG TERM GOAL #5   Title  patient will decrease pain level in RUE to 3/10 or less during functional daily tasks.    Time  12    Period  Weeks    Status  New            Plan - 10/25/18 1301    Clinical Impression Statement  A:Patient is a 53 y/o male S/P right shoulder SAD, SA, DCR causing increased pain, fascial restrictions and decreased ROM and strength resulting in difficulty completing daily tasks while using his RUE as his dominant extremity.    OT Occupational Profile and History  Problem Focused Assessment - Including review of records relating to presenting problem    Occupational  performance deficits (Please refer to evaluation for details):  ADL's;IADL's;Rest and Sleep;Work;Leisure    Body Structure / Function / Physical Skills  ADL;ROM;Scar mobility;IADL;Fascial restriction;Flexibility;Strength;Pain;UE functional use;GMC    Rehab Potential  Excellent    Clinical Decision Making  Several treatment options, min-mod task modification necessary    Comorbidities Affecting Occupational Performance:  None    Modification or Assistance to Complete Evaluation   No modification of  tasks or assist necessary to complete eval    OT Frequency  Other (comment)   1 time a week for 4 weeks. then 2x a week for 8 weeks   OT Duration  12 weeks    OT Treatment/Interventions  Self-care/ADL training;Therapeutic exercise;Manual Therapy;Neuromuscular education;Ultrasound;Therapeutic activities;Compression bandaging;DME and/or AE instruction;Cryotherapy;Electrical Stimulation;Moist Heat;Passive range of motion;Patient/family education;Scar mobilization    Plan  P: Patient will benefit from skilled OT services to increase functional performance during daily and work related tasks while using his RUE. Treatment Plan: P/ROM for 4 weeks. Myofascial release, manual stretching, P/ROM, AA/ROM, A/ROM, general shoulder and scapular strengthening.    Consulted and Agree with Plan of Care  Patient       Patient will benefit from skilled therapeutic intervention in order to improve the following deficits and impairments:   Body Structure / Function / Physical Skills: ADL, ROM, Scar mobility, IADL, Fascial restriction, Flexibility, Strength, Pain, UE functional use, GMC       Visit Diagnosis: 1. Acute pain of right shoulder   2. Stiffness of right shoulder, not elsewhere classified   3. Other symptoms and signs involving the musculoskeletal system       Problem List Patient Active Problem List   Diagnosis Date Noted  . Newly diagnosed diabetes ([place]) 01/13/2017  . Type 2 diabetes mellitus without  complications ([place]) 08/67/6195  . Prediabetes 05/24/2016  . Patellar tendinitis of left knee 12/11/2013  . Morbid obesity ([place]) 05/09/2013  . Osteoarthritis of left knee 11/09/2012  . Obstructive sleep apnea 09/11/2012  . HTN (hypertension) 09/11/2012  . Impaired fasting glucose 09/11/2012  . Degenerative arthritis of left knee 06/03/2011  . S/P lateral meniscectomy of left knee 06/03/2011  . BACK PAIN 05/01/2009  . DERANGEMENT MENISCUS 09/04/2007  . ELBOW PAIN, BILATERAL 04/06/2007  . DEGENERATIVE JOINT DISEASE, KNEE 03/13/2007  . Pain in joint, lower leg 03/13/2007   Ailene Ravel, OTR/L,CBIS  [phone]  10/25/2018, 1:14 PM  [facility] [address] [place], [place], [address] Phone: [phone]   Fax:  [phone]  Name: ABISAI DEER MRN: [id number] Date of Birth: 05-22-1965

## 2018-10-31 ENCOUNTER — Other Ambulatory Visit: Payer: Self-pay

## 2018-10-31 ENCOUNTER — Ambulatory Visit (HOSPITAL_COMMUNITY): Payer: No Typology Code available for payment source

## 2018-10-31 DIAGNOSIS — M25611 Stiffness of right shoulder, not elsewhere classified: Secondary | ICD-10-CM

## 2018-10-31 DIAGNOSIS — M25511 Pain in right shoulder: Secondary | ICD-10-CM

## 2018-10-31 DIAGNOSIS — R29898 Other symptoms and signs involving the musculoskeletal system: Secondary | ICD-10-CM

## 2018-10-31 NOTE — Patient Instructions (Signed)
Complete 2-3 times a day. Hold for 5 seconds. Complete 3-5 times.    SHOULDER - ISOMETRIC ABDUCTION  Gently push your elbow out to the side into a wall with your elbow bent.     SHOULDER - ISOMETRIC ADDUCTION  Gently push your elbow into the side of your body.   SHOULDER - ISOMETRIC EXTERNAL ROTATION   Gently press your hand into a wall using the back side of your hand.  Maintain a bent elbow the entire time.        SHOULDER - ISOMETRIC EXTENSION  Gently push your a bent elbow back into a wall.       SHOULDER - ISOMETRIC FLEXION  Gently push your fist forward into a wall with your elbow bent.     SHOULDER - ISOMETRIC INTERNAL ROTATION   Gently press your hand into a wall using the palm side of your hand.  Maintain a bent elbow the entire time.

## 2018-10-31 NOTE — Therapy (Signed)
[facility] [place], [place], [address] Phone: [phone]   Fax:  [phone]  Occupational Therapy Treatment  Patient Details  Name: Adam Hahn MRN: [id number] Date of Birth: 1965-05-20 Referring Provider (OT): Dr. Justice Britain   Encounter Date: 10/31/2018  OT End of Session - 10/31/18 1202    Visit Number  2    Number of Visits  20    Date for OT Re-Evaluation  12/06/18   mini reassess: 11/22/18   Authorization Type  Patient is not filing with [employer] ($30 co pay- 60 visit limit). He is only filing with [facility].    Authorization Time Period  [facility] approved 15 visits. Request additional visits when needed.    Authorization - Visit Number  2    Authorization - Number of Visits  15    OT Start Time  1751    OT Stop Time  1115    OT Time Calculation (min)  40 min    Activity Tolerance  Patient tolerated treatment well;Patient limited by pain    Behavior During Therapy  [facility] for tasks assessed/performed       Past Medical History:  Diagnosis Date  . Allergic rhinitis   . Arthritis    knees,  right shoulder ac joint  . History of exercise stress test 10-09-2001 in epic   normal  . HTN (hypertension)   . OSA on CPAP   . Shoulder impingement, right   . Type 2 diabetes mellitus ([place])    followed by pcp  . Wears glasses     Past Surgical History:  Procedure Laterality Date  . KNEE SURGERY Left x4  last one 1996  . LAPAROSCOPIC CHOLECYSTECTOMY  09-20-2007  '@[facility]'   . SHOULDER ARTHROSCOPY WITH ROTATOR CUFF REPAIR Right 10/17/2018   Procedure: Right shoulder arthroscopy with subacromial decompression, distal clavicle resection,  rotator cuff DEBRIEDMENT, LABRIAL DEBRIDMENT;  Surgeon: Justice Britain, MD;  Location: [facility];  Service: Orthopedics;  Laterality: Right;  132mn    There were no vitals filed for this visit.  Subjective Assessment - 10/31/18 1152    Subjective   S: The doctor took me out of the  sling. He said to use it as needed. I'm not lifting antything unless it's a drink. I've been working on those exercises you gave me.    Currently in Pain?  Yes    Pain Score  5     Pain Location  Shoulder    Pain Orientation  Right    Pain Descriptors / Indicators  Aching;Constant;Sore    Pain Type  Surgical pain         [facility] OT Assessment - 10/31/18 1153      Assessment   Medical Diagnosis  Right shoulder SA, SAD, DCR      Precautions   Precautions  Shoulder    Type of Shoulder Precautions  P/ROM only for 4 weeks (11/14/18) .     Precaution Comments  6/30: UPDATE: At last MD appointment he is able to D/C the sling and use his arm within pain tolerance.                 OT Treatments/Exercises (OP) - 10/31/18 1154      Exercises   Exercises  Shoulder      Shoulder Exercises: Supine   Protraction  PROM;10 reps    Horizontal ABduction  PROM;10 reps    External Rotation  PROM;10 reps  Internal Rotation  PROM;10 reps    Flexion  PROM;10 reps    ABduction  PROM;10 reps      Shoulder Exercises: Therapy Ball   Flexion  10 reps    ABduction  10 reps      Shoulder Exercises: ROM/Strengthening   Thumb Tacks  1' low level    Prot/Ret//Elev/Dep  1' low level      Shoulder Exercises: Isometric Strengthening   Flexion  3X5"   standing   Extension  3X5"   standing   External Rotation  3X5"   standing   Internal Rotation  3X5"   standing    ABduction  3X5"   standing    ADduction  3X5"   standing     Manual Therapy   Manual Therapy  Myofascial release    Manual therapy comments  Completed separate from exercises.     Myofascial Release  Myofascial release and manual stretching to right upper arm, trapezius, and scapularis region to decrease fascial restrictions and increased joint mobility in a pain free zone.              OT Education - 10/31/18 1102    Education Details  reviewed goals. Added isometric stranding shoulder exercises to HEP.     Person(s) Educated  Patient    Methods  Explanation;Demonstration;Verbal cues;Handout    Comprehension  Returned demonstration;Verbalized understanding       OT Short Term Goals - 10/31/18 1203      OT SHORT TERM GOAL #1   Title  Patient will be educated and independent with HEP to increase functional use of his RUE be able to return to utilizing it as his dominant extremity for all daily tasks.    Time  6    Period  Weeks    Status  On-going    Target Date  12/06/18      OT SHORT TERM GOAL #2   Title  Patient will increase RUE P/ROM to [facility] in order to increase ability to bath himself and wash his face without assistance.    Time  6    Period  Weeks    Status  On-going      OT SHORT TERM GOAL #3   Title  Patient will increase RUE strength to 3/5 in order to complete waist level activities without difficulty.    Time  6    Period  Weeks    Status  On-going      OT SHORT TERM GOAL #4   Title  Patient will decrease pain level to 6/10 or less during functional use and low level reaching tasks.    Time  6    Period  Weeks    Status  On-going      OT SHORT TERM GOAL #5   Title  Patient will decrease fascial restrictions to mod amount or less in order to increase functional mobility needed in the RUE to complete low level reaching.    Time  6    Period  Weeks    Status  On-going        OT Long Term Goals - 10/31/18 1203      OT LONG TERM GOAL #1   Title  Patient will return to highest level of independence using his RUE for all work and daily tasks while progressing as tolerated.    Time  12    Period  Weeks    Status  On-going  OT LONG TERM GOAL #2   Title  Patient will increase A/ROM of his RUE to [facility] in order to complete reaching tasks above his head with no difficulty.    Time  12    Period  Weeks    Status  On-going      OT LONG TERM GOAL #3   Title  Patient will increase his RUE strength to 4+/5 in order to return to work and begin his usual job  requirements without restrictions.    Time  12    Period  Weeks    Status  On-going      OT LONG TERM GOAL #4   Title  Patient will decrease his fascial restrictions to min amount or less in order to increase functional mobility needed to complete reaching tasks.    Time  12    Period  Weeks    Status  On-going      OT LONG TERM GOAL #5   Title  patient will decrease pain level in RUE to 3/10 or less during functional daily tasks.    Time  12    Period  Weeks    Status  On-going            Plan - 10/31/18 1203    Clinical Impression Statement  A: Initiated myofascial release and manual stretching. Added Isometric shoulder exercises to HEP. Pt with max fascial restrictions in right upper trapezius region with manual techniques completed to address. VC were provided for form and technique.    Body Structure / Function / Physical Skills  ADL;ROM;Scar mobility;IADL;Fascial restriction;Flexibility;Strength;Pain;UE functional use;GMC    Plan  P: Continue with P/ROM shoulder exercises. Work on increasing thumb tacks and pro/ret/elev/dep to shoulder level.    Consulted and Agree with Plan of Care  Patient       Patient will benefit from skilled therapeutic intervention in order to improve the following deficits and impairments:   Body Structure / Function / Physical Skills: ADL, ROM, Scar mobility, IADL, Fascial restriction, Flexibility, Strength, Pain, UE functional use, GMC       Visit Diagnosis: 1. Acute pain of right shoulder   2. Stiffness of right shoulder, not elsewhere classified   3. Other symptoms and signs involving the musculoskeletal system       Problem List Patient Active Problem List   Diagnosis Date Noted  . Newly diagnosed diabetes ([place]) 01/13/2017  . Type 2 diabetes mellitus without complications ([place]) 22/97/9892  . Prediabetes 05/24/2016  . Patellar tendinitis of left knee 12/11/2013  . Morbid obesity ([place]) 05/09/2013  . Osteoarthritis of left knee  11/09/2012  . Obstructive sleep apnea 09/11/2012  . HTN (hypertension) 09/11/2012  . Impaired fasting glucose 09/11/2012  . Degenerative arthritis of left knee 06/03/2011  . S/P lateral meniscectomy of left knee 06/03/2011  . BACK PAIN 05/01/2009  . DERANGEMENT MENISCUS 09/04/2007  . ELBOW PAIN, BILATERAL 04/06/2007  . DEGENERATIVE JOINT DISEASE, KNEE 03/13/2007  . Pain in joint, lower leg 03/13/2007   Ailene Ravel, OTR/L,CBIS  [phone]  10/31/2018, 12:06 PM  [facility] [place], [place], [address] Phone: [phone]   Fax:  [phone]  Name: Adam Hahn MRN: [id number] Date of Birth: 18-Aug-1965

## 2018-11-07 ENCOUNTER — Ambulatory Visit (HOSPITAL_COMMUNITY): Payer: No Typology Code available for payment source | Attending: Orthopedic Surgery

## 2018-11-07 ENCOUNTER — Encounter (HOSPITAL_COMMUNITY): Payer: Self-pay

## 2018-11-07 ENCOUNTER — Other Ambulatory Visit: Payer: Self-pay

## 2018-11-07 DIAGNOSIS — R29898 Other symptoms and signs involving the musculoskeletal system: Secondary | ICD-10-CM | POA: Diagnosis present

## 2018-11-07 DIAGNOSIS — M25611 Stiffness of right shoulder, not elsewhere classified: Secondary | ICD-10-CM

## 2018-11-07 DIAGNOSIS — M25511 Pain in right shoulder: Secondary | ICD-10-CM | POA: Insufficient documentation

## 2018-11-07 NOTE — Therapy (Signed)
[facility] [place], [place], [address] Phone: [phone]   Fax:  [phone]  Occupational Therapy Treatment  Patient Details  Name: Adam Hahn MRN: [id number] Date of Birth: Aug 04, 1965 Referring Provider (OT): Dr. Justice Britain   Encounter Date: 11/07/2018  OT End of Session - 11/07/18 1203    Visit Number  3    Number of Visits  20    Date for OT Re-Evaluation  12/06/18   mini reassess: 11/22/18   Authorization Type  Patient is not filing with [employer] ($30 co pay- 60 visit limit). He is only filing with [facility].    Authorization Time Period  [facility] approved 15 visits. Request additional visits when needed.    Authorization - Visit Number  3    Authorization - Number of Visits  15    OT Start Time  1133    OT Stop Time  1221    OT Time Calculation (min)  48 min    Activity Tolerance  Patient tolerated treatment well    Behavior During Therapy  WFL for tasks assessed/performed       Past Medical History:  Diagnosis Date  . Allergic rhinitis   . Arthritis    knees,  right shoulder ac joint  . History of exercise stress test 10-09-2001 in epic   normal  . HTN (hypertension)   . OSA on CPAP   . Shoulder impingement, right   . Type 2 diabetes mellitus ([place])    followed by pcp  . Wears glasses     Past Surgical History:  Procedure Laterality Date  . KNEE SURGERY Left x4  last one 1996  . LAPAROSCOPIC CHOLECYSTECTOMY  09-20-2007  _0   . SHOULDER ARTHROSCOPY WITH ROTATOR CUFF REPAIR Right 10/17/2018   Procedure: Right shoulder arthroscopy with subacromial decompression, distal clavicle resection,  rotator cuff DEBRIEDMENT, LABRIAL DEBRIDMENT;  Surgeon: Justice Britain, MD;  Location: [facility];  Service: Orthopedics;  Laterality: Right;  178mn    There were no vitals filed for this visit.  Subjective Assessment - 11/07/18 1148    Subjective   S: It started hurting me Friday when i was sitting on the  couch.    Currently in Pain?  Yes    Pain Score  7     Pain Location  Shoulder    Pain Orientation  Right    Pain Descriptors / Indicators  Aching;Constant;Sore    Pain Type  Surgical pain    Pain Radiating Towards  Up the neck and down scapula and wraps the front.    Pain Onset  In the past 7 days    Pain Frequency  Constant    Aggravating Factors   using the arm to much    Pain Relieving Factors  pain medication, ice    Effect of Pain on Daily Activities  severe effect    Multiple Pain Sites  No         [facility] OT Assessment - 11/07/18 1150      Assessment   Medical Diagnosis  Right shoulder SA, SAD, DCR      Precautions   Precautions  Shoulder    Type of Shoulder Precautions  P/ROM only for 4 weeks (11/14/18) .     Precaution Comments  6/30: UPDATE: At last MD appointment he is able to D/C the sling and use his arm within pain tolerance.  OT Treatments/Exercises (OP) - 11/07/18 1151      Exercises   Exercises  Shoulder      Shoulder Exercises: Supine   Protraction  PROM;10 reps    Horizontal ABduction  PROM;10 reps    External Rotation  PROM;10 reps    Internal Rotation  PROM;10 reps    Flexion  PROM;10 reps    ABduction  PROM;10 reps    Other Supine Exercises  serratus anterior punch; A/ROM; 10X      Shoulder Exercises: Seated   Extension  AROM;10 reps    Row  AROM;10 reps    Other Seated Exercises  scapular depression; 10X; A/ROM      Shoulder Exercises: Therapy Ball   Flexion  --   12X   ABduction  --   12X     Shoulder Exercises: ROM/Strengthening   Thumb Tacks  1'    Prot/Ret//Elev/Dep  1'      Shoulder Exercises: Isometric Strengthening   Flexion  Supine;5X5"    Extension  Supine;5X5"    External Rotation  Supine;5X5"    Internal Rotation  Supine;5X5"    ABduction  Supine;5X5"    ADduction  Supine;5X5"      Modalities   Modalities  Electrical Stimulation;Moist Heat      Moist Heat Therapy   Number Minutes Moist Heat   8 Minutes    Moist Heat Location  Shoulder      Electrical Stimulation   Electrical Stimulation Location  right shoulder    Electrical Stimulation Action  interferential    Electrical Stimulation Parameters  13.5 CV    Electrical Stimulation Goals  Pain      Manual Therapy   Manual Therapy  Myofascial release    Manual therapy comments  Completed separate from exercises.     Myofascial Release  Myofascial release and manual stretching to right upper arm, trapezius, and scapularis region to decrease fascial restrictions and increased joint mobility in a pain free zone.                OT Short Term Goals - 10/31/18 1203      OT SHORT TERM GOAL #1   Title  Patient will be educated and independent with HEP to increase functional use of his RUE be able to return to utilizing it as his dominant extremity for all daily tasks.    Time  6    Period  Weeks    Status  On-going    Target Date  12/06/18      OT SHORT TERM GOAL #2   Title  Patient will increase RUE P/ROM to [facility] in order to increase ability to bath himself and wash his face without assistance.    Time  6    Period  Weeks    Status  On-going      OT SHORT TERM GOAL #3   Title  Patient will increase RUE strength to 3/5 in order to complete waist level activities without difficulty.    Time  6    Period  Weeks    Status  On-going      OT SHORT TERM GOAL #4   Title  Patient will decrease pain level to 6/10 or less during functional use and low level reaching tasks.    Time  6    Period  Weeks    Status  On-going      OT SHORT TERM GOAL #5   Title  Patient will decrease fascial restrictions  to mod amount or less in order to increase functional mobility needed in the RUE to complete low level reaching.    Time  6    Period  Weeks    Status  On-going        OT Long Term Goals - 10/31/18 1203      OT LONG TERM GOAL #1   Title  Patient will return to highest level of independence using his RUE for all work and  daily tasks while progressing as tolerated.    Time  12    Period  Weeks    Status  On-going      OT LONG TERM GOAL #2   Title  Patient will increase A/ROM of his RUE to [facility] in order to complete reaching tasks above his head with no difficulty.    Time  12    Period  Weeks    Status  On-going      OT LONG TERM GOAL #3   Title  Patient will increase his RUE strength to 4+/5 in order to return to work and begin his usual job requirements without restrictions.    Time  12    Period  Weeks    Status  On-going      OT LONG TERM GOAL #4   Title  Patient will decrease his fascial restrictions to min amount or less in order to increase functional mobility needed to complete reaching tasks.    Time  12    Period  Weeks    Status  On-going      OT LONG TERM GOAL #5   Title  patient will decrease pain level in RUE to 3/10 or less during functional daily tasks.    Time  12    Period  Weeks    Status  On-going            Plan - 11/07/18 1218    Clinical Impression Statement  A: ES and heat used at end of session for pain mangement. Patient experienced pain during passive stretching starting at 90 degrees flexion and abduction. VC for form and technique.    Body Structure / Function / Physical Skills  ADL;ROM;Scar mobility;IADL;Fascial restriction;Flexibility;Strength;Pain;UE functional use;GMC    Plan  P: Follow up on pain. Progress to supine AA/ROM if able to tolerate. Add pulleys    Consulted and Agree with Plan of Care  Patient       Patient will benefit from skilled therapeutic intervention in order to improve the following deficits and impairments:   Body Structure / Function / Physical Skills: ADL, ROM, Scar mobility, IADL, Fascial restriction, Flexibility, Strength, Pain, UE functional use, GMC       Visit Diagnosis: 1. Acute pain of right shoulder   2. Stiffness of right shoulder, not elsewhere classified   3. Other symptoms and signs involving the musculoskeletal  system       Problem List Patient Active Problem List   Diagnosis Date Noted  . Newly diagnosed diabetes ([place]) 01/13/2017  . Type 2 diabetes mellitus without complications ([place]) 56/21/3086  . Prediabetes 05/24/2016  . Patellar tendinitis of left knee 12/11/2013  . Morbid obesity ([place]) 05/09/2013  . Osteoarthritis of left knee 11/09/2012  . Obstructive sleep apnea 09/11/2012  . HTN (hypertension) 09/11/2012  . Impaired fasting glucose 09/11/2012  . Degenerative arthritis of left knee 06/03/2011  . S/P lateral meniscectomy of left knee 06/03/2011  . BACK PAIN 05/01/2009  . DERANGEMENT MENISCUS 09/04/2007  .  ELBOW PAIN, BILATERAL 04/06/2007  . DEGENERATIVE JOINT DISEASE, KNEE 03/13/2007  . Pain in joint, lower leg 03/13/2007   Ailene Ravel, OTR/L,CBIS  [phone]  11/07/2018, 12:21 PM  [facility] [address] [place], [place], [address] Phone: [phone]   Fax:  [phone]  Name: Adam Hahn MRN: [id number] Date of Birth: 1965-09-09

## 2018-11-14 ENCOUNTER — Other Ambulatory Visit: Payer: Self-pay

## 2018-11-14 ENCOUNTER — Ambulatory Visit (HOSPITAL_COMMUNITY): Payer: No Typology Code available for payment source

## 2018-11-14 DIAGNOSIS — M25611 Stiffness of right shoulder, not elsewhere classified: Secondary | ICD-10-CM

## 2018-11-14 DIAGNOSIS — R29898 Other symptoms and signs involving the musculoskeletal system: Secondary | ICD-10-CM

## 2018-11-14 DIAGNOSIS — M25511 Pain in right shoulder: Secondary | ICD-10-CM

## 2018-11-14 NOTE — Therapy (Signed)
[facility] [place], [place], [address] Phone: [phone]   Fax:  [phone]  Occupational Therapy Treatment  Patient Details  Name: Adam Hahn MRN: [id number] Date of Birth: [birth year] Referring Provider (OT): Dr. Justice Britain   Encounter Date: 11/14/2018  OT End of Session - 11/14/18 1108    Visit Number  4    Number of Visits  20    Date for OT Re-Evaluation  12/06/18   mini reassess: 11/22/18   Authorization Type  Patient is not filing with [employer] ($30 co pay- 60 visit limit). He is only filing with [facility].    Authorization Time Period  [facility] approved 15 visits. Request additional visits when needed.    Authorization - Visit Number  4    Authorization - Number of Visits  15    OT Start Time  5329    OT Stop Time  1115    OT Time Calculation (min)  40 min    Activity Tolerance  Patient tolerated treatment well    Behavior During Therapy  WFL for tasks assessed/performed       Past Medical History:  Diagnosis Date  . Allergic rhinitis   . Arthritis    knees,  right shoulder ac joint  . History of exercise stress test 10-09-2001 in epic   normal  . HTN (hypertension)   . OSA on CPAP   . Shoulder impingement, right   . Type 2 diabetes mellitus ([place])    followed by pcp  . Wears glasses     Past Surgical History:  Procedure Laterality Date  . KNEE SURGERY Left x4  last one 1996  . LAPAROSCOPIC CHOLECYSTECTOMY  09-20-2007  @[facility]   . SHOULDER ARTHROSCOPY WITH ROTATOR CUFF REPAIR Right 10/17/2018   Procedure: Right shoulder arthroscopy with subacromial decompression, distal clavicle resection,  rotator cuff DEBRIEDMENT, LABRIAL DEBRIDMENT;  Surgeon: Justice Britain, MD;  Location: [facility];  Service: Orthopedics;  Laterality: Right;  161min    There were no vitals filed for this visit.  Subjective Assessment - 11/14/18 1120    Subjective   S: I'm working my arm a lot more. I'll be happy when my  pain is gone and my ROM is good.    Currently in Pain?  Yes    Pain Score  5     Pain Location  Shoulder    Pain Orientation  Right    Pain Descriptors / Indicators  Aching;Constant;Sore    Pain Type  Chronic pain    Pain Radiating Towards  occassinoally up the neck and down scapula wrapping around the front.    Pain Onset  1 to 4 weeks ago    Pain Frequency  Constant    Aggravating Factors   using arm more    Effect of Pain on Daily Activities  mod effect    Multiple Pain Sites  No         [facility] OT Assessment - 11/14/18 1105      Assessment   Medical Diagnosis  Right shoulder SA, SAD, DCR      Precautions   Precautions  Shoulder    Type of Shoulder Precautions  P/ROM only for 4 weeks (11/14/18) .     Precaution Comments  6/30: UPDATE: At last MD appointment he is able to D/C the sling and use his arm within pain tolerance.  OT Treatments/Exercises (OP) - 11/14/18 1059      Exercises   Exercises  Shoulder      Shoulder Exercises: Supine   Protraction  PROM;AAROM;10 reps    Horizontal ABduction  PROM;AAROM;10 reps    External Rotation  PROM;AAROM;10 reps    Internal Rotation  PROM;AAROM;10 reps    Flexion  PROM;AAROM;10 reps    ABduction  PROM;AAROM;10 reps      Shoulder Exercises: Pulleys   Flexion  1 minute   seated   ABduction  1 minute   seated     Shoulder Exercises: ROM/Strengthening   Wall Wash  1'    Proximal Shoulder Strengthening, Supine  10X A/ROM no rest breaks    Proximal Shoulder Strengthening, Seated  10X A/ROM no rest breaks    Other ROM/Strengthening Exercises  Proximal shoulder strengthening against wall with washcloth; 90 degrees shoulder flexion; 1'      Manual Therapy   Manual Therapy  Myofascial release    Manual therapy comments  Completed separate from exercises.     Myofascial Release  Myofascial release and manual stretching to right upper arm, trapezius, and scapularis region to decrease fascial restrictions and  increased joint mobility in a pain free zone.              OT Education - 11/14/18 1101    Education Details  Pt provided with AA/ROM standing exercises. He is to stop previous exercises.    Person(s) Educated  Patient    Methods  Explanation;Demonstration;Verbal cues;Handout    Comprehension  Returned demonstration;Verbalized understanding       OT Short Term Goals - 10/31/18 1203      OT SHORT TERM GOAL #1   Title  Patient will be educated and independent with HEP to increase functional use of his RUE be able to return to utilizing it as his dominant extremity for all daily tasks.    Time  6    Period  Weeks    Status  On-going    Target Date  12/06/18      OT SHORT TERM GOAL #2   Title  Patient will increase RUE P/ROM to [facility] in order to increase ability to bath himself and wash his face without assistance.    Time  6    Period  Weeks    Status  On-going      OT SHORT TERM GOAL #3   Title  Patient will increase RUE strength to 3/5 in order to complete waist level activities without difficulty.    Time  6    Period  Weeks    Status  On-going      OT SHORT TERM GOAL #4   Title  Patient will decrease pain level to 6/10 or less during functional use and low level reaching tasks.    Time  6    Period  Weeks    Status  On-going      OT SHORT TERM GOAL #5   Title  Patient will decrease fascial restrictions to mod amount or less in order to increase functional mobility needed in the RUE to complete low level reaching.    Time  6    Period  Weeks    Status  On-going        OT Long Term Goals - 10/31/18 1203      OT LONG TERM GOAL #1   Title  Patient will return to highest level of independence using his RUE for all  work and daily tasks while progressing as tolerated.    Time  12    Period  Weeks    Status  On-going      OT LONG TERM GOAL #2   Title  Patient will increase A/ROM of his RUE to [facility] in order to complete reaching tasks above his head with no  difficulty.    Time  12    Period  Weeks    Status  On-going      OT LONG TERM GOAL #3   Title  Patient will increase his RUE strength to 4+/5 in order to return to work and begin his usual job requirements without restrictions.    Time  12    Period  Weeks    Status  On-going      OT LONG TERM GOAL #4   Title  Patient will decrease his fascial restrictions to min amount or less in order to increase functional mobility needed to complete reaching tasks.    Time  12    Period  Weeks    Status  On-going      OT LONG TERM GOAL #5   Title  patient will decrease pain level in RUE to 3/10 or less during functional daily tasks.    Time  12    Period  Weeks    Status  On-going            Plan - 11/14/18 1110    Clinical Impression Statement  A: Progressed to supine and standing AA/ROM. Added wall wash, proximal shoulder strengthening and pulleys. Updated HEP to AA/ROM. Patient required VC for form and technique. No muscle knots palpated in RUE although min-mod fascial restrictions noted in right upper arm/deltoid region. Pt reports intermittent scapular pain when at home with no known cause.    Body Structure / Function / Physical Skills  ADL;ROM;Scar mobility;IADL;Fascial restriction;Flexibility;Strength;Pain;UE functional use;GMC    Plan  P: Mini reassess. Complete FOTO. follow up on HEP. Continue with AA/ROM increasing repetitions to 15 or progress to A/ROM if needed.    Consulted and Agree with Plan of Care  Patient       Patient will benefit from skilled therapeutic intervention in order to improve the following deficits and impairments:   Body Structure / Function / Physical Skills: ADL, ROM, Scar mobility, IADL, Fascial restriction, Flexibility, Strength, Pain, UE functional use, GMC       Visit Diagnosis: 1. Other symptoms and signs involving the musculoskeletal system   2. Stiffness of right shoulder, not elsewhere classified   3. Acute pain of right shoulder        Problem List Patient Active Problem List   Diagnosis Date Noted  . Newly diagnosed diabetes ([place]) 01/13/2017  . Type 2 diabetes mellitus without complications ([place]) 53/29/9242  . Prediabetes 05/24/2016  . Patellar tendinitis of left knee 12/11/2013  . Morbid obesity ([place]) 05/09/2013  . Osteoarthritis of left knee 11/09/2012  . Obstructive sleep apnea 09/11/2012  . HTN (hypertension) 09/11/2012  . Impaired fasting glucose 09/11/2012  . Degenerative arthritis of left knee 06/03/2011  . S/P lateral meniscectomy of left knee 06/03/2011  . BACK PAIN 05/01/2009  . DERANGEMENT MENISCUS 09/04/2007  . ELBOW PAIN, BILATERAL 04/06/2007  . DEGENERATIVE JOINT DISEASE, KNEE 03/13/2007  . Pain in joint, lower leg 03/13/2007   Ailene Ravel, OTR/L,CBIS  [phone]  11/14/2018, 11:21 AM  [facility] [place], [place], [address] Phone: [phone]  Fax:  [phone]  Name: Adam Hahn MRN: [id number] Date of Birth: [birth year]

## 2018-11-14 NOTE — Patient Instructions (Signed)

## 2018-11-21 ENCOUNTER — Other Ambulatory Visit: Payer: Self-pay

## 2018-11-21 ENCOUNTER — Encounter (HOSPITAL_COMMUNITY): Payer: Self-pay

## 2018-11-21 ENCOUNTER — Ambulatory Visit (HOSPITAL_COMMUNITY): Payer: No Typology Code available for payment source

## 2018-11-21 DIAGNOSIS — M25511 Pain in right shoulder: Secondary | ICD-10-CM

## 2018-11-21 DIAGNOSIS — R29898 Other symptoms and signs involving the musculoskeletal system: Secondary | ICD-10-CM

## 2018-11-21 DIAGNOSIS — M25611 Stiffness of right shoulder, not elsewhere classified: Secondary | ICD-10-CM

## 2018-11-21 NOTE — Therapy (Signed)
[place], [place], [address] Phone: [phone]   Fax:  [phone]  Occupational Therapy Treatment  Patient Details  Name: Adam Hahn MRN: [id number] Date of Birth: 11/08/1965 Referring Provider (OT): Dr. Justice Britain   Encounter Date: 11/21/2018  OT End of Session - 11/21/18 1138    Visit Number  5    Number of Visits  20    Date for OT Re-Evaluation  12/06/18    Authorization Type  Patient is not filing with [employer] ($30 co pay- 60 visit limit). He is only filing with [facility].    Authorization Time Period  [facility] approved 15 visits. Request additional visits when needed.    Authorization - Visit Number  5    Authorization - Number of Visits  15    OT Start Time  2482    OT Stop Time  1115    OT Time Calculation (min)  42 min    Activity Tolerance  Patient tolerated treatment well    Behavior During Therapy  WFL for tasks assessed/performed       Past Medical History:  Diagnosis Date  . Allergic rhinitis   . Arthritis    knees,  right shoulder ac joint  . History of exercise stress test 10-09-2001 in epic   normal  . HTN (hypertension)   . OSA on CPAP   . Shoulder impingement, right   . Type 2 diabetes mellitus ([place])    followed by pcp  . Wears glasses     Past Surgical History:  Procedure Laterality Date  . KNEE SURGERY Left x4  last one 1996  . LAPAROSCOPIC CHOLECYSTECTOMY  09-20-2007  '@[facility]'   . SHOULDER ARTHROSCOPY WITH ROTATOR CUFF REPAIR Right 10/17/2018   Procedure: Right shoulder arthroscopy with subacromial decompression, distal clavicle resection,  rotator cuff DEBRIEDMENT, LABRIAL DEBRIDMENT;  Surgeon: Justice Britain, MD;  Location: [facility];  Service: Orthopedics;  Laterality: Right;  143mn    There were no vitals filed for this visit.  Subjective Assessment - 11/21/18 1034    Currently in Pain?  Yes    Pain Score  4     Pain Location  Shoulder    Pain Orientation   Right    Pain Descriptors / Indicators  Sore;Constant    Pain Type  Chronic pain    Pain Radiating Towards  Remains wrapped around the entire shoulder joint.    Pain Onset  1 to 4 weeks ago    Pain Frequency  Constant    Aggravating Factors   sometimes no reason. sometimes using it.    Pain Relieving Factors  ice, pain medication    Effect of Pain on Daily Activities  No effect on just general movement and use. Not lifting anything at this time.         [facility]      Assessment   Medical Diagnosis  Right shoulder SA, SAD, DCR      Precautions   Precautions  Shoulder    Type of Shoulder Precautions  P/ROM only for 4 weeks (11/14/18) .     Precaution Comments  6/30: UPDATE: At last MD appointment he is able to D/C the sling and use his arm within pain tolerance.        Observation/Other Assessments   Focus on Therapeutic Outcomes (FOTO)   61/100      ROM / Strength   AROM / PROM /  Strength  AROM;PROM;Strength      Palpation   Palpation comment  Moderate fascial restrictions noted in the right upper arm, trapezius, and scapularis region      AROM   Overall AROM Comments  Assessed standing. A/ROM not assessed prior to this date. IR/er adducted    AROM Assessment Site  Shoulder    Right/Left Shoulder  Right    Right Shoulder Flexion  150 Degrees    Right Shoulder ABduction  105 Degrees    Right Shoulder Internal Rotation  90 Degrees    Right Shoulder External Rotation  62 Degrees      PROM   Overall PROM Comments  Assessed supine. er abducted. Adducted at eval.     PROM Assessment Site  Shoulder    Right/Left Shoulder  Right    Right Shoulder Flexion  180 Degrees   previous: 45   Right Shoulder ABduction  180 Degrees   previous: 74   Right Shoulder Internal Rotation  90 Degrees   previous: same   Right Shoulder External Rotation  60 Degrees   previous: 32 adducted     Strength   Overall Strength Comments  Assessed standing. IR/er adducted.  Strength not assessed prior to this date.     Strength Assessment Site  Shoulder    Right/Left Shoulder  Right    Right Shoulder Flexion  3/5    Right Shoulder ABduction  3-/5    Right Shoulder Internal Rotation  3+/5    Right Shoulder External Rotation  3+/5               OT Treatments/Exercises (OP) - 11/21/18 1051      Exercises   Exercises  Shoulder      Shoulder Exercises: Supine   Protraction  PROM;5 reps;AROM;10 reps    Horizontal ABduction  PROM;5 reps;AROM;10 reps    External Rotation  PROM;5 reps;AROM;10 reps    Internal Rotation  PROM;5 reps;AROM;10 reps    Flexion  PROM;5 reps;AROM;10 reps    ABduction  PROM;5 reps;AROM;10 reps      Shoulder Exercises: Standing   Protraction  AAROM;12 reps    Horizontal ABduction  AAROM;12 reps    External Rotation  AAROM;12 reps    Internal Rotation  AAROM;12 reps    Flexion  AAROM;12 reps    ABduction  AAROM;12 reps      Shoulder Exercises: Pulleys   Flexion  1 minute   seated   ABduction  1 minute   seated     Shoulder Exercises: ROM/Strengthening   Wall Wash  1'    Proximal Shoulder Strengthening, Seated  10X A/ROM no rest breaks    Other ROM/Strengthening Exercises  Proximal shoulder strengthening against wall with washcloth; 90 degrees shoulder flexion; 1'      Manual Therapy   Manual Therapy  Myofascial release    Manual therapy comments  Completed separate from exercises.     Myofascial Release  Myofascial release and manual stretching to right upper arm, trapezius, and scapularis region to decrease fascial restrictions and increased joint mobility in a pain free zone.                OT Short Term Goals - 11/21/18 1055      OT SHORT TERM GOAL #1   Title  Patient will be educated and independent with HEP to increase functional use of his RUE be able to return to utilizing it as his dominant extremity for all daily tasks.  Time  6    Period  Weeks    Status  On-going    Target Date  12/06/18       OT SHORT TERM GOAL #2   Title  Patient will increase RUE P/ROM to [facility] in order to increase ability to bath himself and wash his face without assistance.    Time  6    Period  Weeks    Status  Achieved      OT SHORT TERM GOAL #3   Title  Patient will increase RUE strength to 3/5 in order to complete waist level activities without difficulty.    Time  6    Period  Weeks    Status  Achieved      OT SHORT TERM GOAL #4   Title  Patient will decrease pain level to 6/10 or less during functional use and low level reaching tasks.    Time  6    Period  Weeks    Status  Achieved      OT SHORT TERM GOAL #5   Title  Patient will decrease fascial restrictions to mod amount or less in order to increase functional mobility needed in the RUE to complete low level reaching.    Time  6    Period  Weeks    Status  Achieved        OT Long Term Goals - 10/31/18 1203      OT LONG TERM GOAL #1   Title  Patient will return to highest level of independence using his RUE for all work and daily tasks while progressing as tolerated.    Time  12    Period  Weeks    Status  On-going      OT LONG TERM GOAL #2   Title  Patient will increase A/ROM of his RUE to [facility] in order to complete reaching tasks above his head with no difficulty.    Time  12    Period  Weeks    Status  On-going      OT LONG TERM GOAL #3   Title  Patient will increase his RUE strength to 4+/5 in order to return to work and begin his usual job requirements without restrictions.    Time  12    Period  Weeks    Status  On-going      OT LONG TERM GOAL #4   Title  Patient will decrease his fascial restrictions to min amount or less in order to increase functional mobility needed to complete reaching tasks.    Time  12    Period  Weeks    Status  On-going      OT LONG TERM GOAL #5   Title  patient will decrease pain level in RUE to 3/10 or less during functional daily tasks.    Time  12    Period  Weeks    Status   On-going            Plan - 11/21/18 1138    Clinical Impression Statement  A: Mini reassessment completed this date. patiet has met all short term goals with the HEP goal still on-going. He is demonstrating full passive ROM and WFL for A/ROM. He reports that he is using it as much as he can with the exception of lifting. He attempted to lift a pitcher of tea that was not full and he was unable to do so. He reports his pain is still consistant  and he will experience periods of sharp stabbing pain in his shoulder blade. He was able to progress to supine A/ROM. Continues with AA/ROM standing for functional mobility. VC for form and technique as needed.    Body Structure / Function / Physical Skills  ADL;ROM;Scar mobility;IADL;Fascial restriction;Flexibility;Strength;Pain;UE functional use;GMC    Plan  P: Continue with A/ROM supine and AA/ROM standing. Continue to work on increasing ROM needed for functional reaching tasks. Add scapular strengthening with red band.    Consulted and Agree with Plan of Care  Patient       Patient will benefit from skilled therapeutic intervention in order to improve the following deficits and impairments:   Body Structure / Function / Physical Skills: ADL, ROM, Scar mobility, IADL, Fascial restriction, Flexibility, Strength, Pain, UE functional use, GMC       Visit Diagnosis: 1. Other symptoms and signs involving the musculoskeletal system   2. Acute pain of right shoulder   3. Stiffness of right shoulder, not elsewhere classified       Problem List Patient Active Problem List   Diagnosis Date Noted  . Newly diagnosed diabetes ([place]) 01/13/2017  . Type 2 diabetes mellitus without complications ([place]) 41/28/7867  . Prediabetes 05/24/2016  . Patellar tendinitis of left knee 12/11/2013  . Morbid obesity ([place]) 05/09/2013  . Osteoarthritis of left knee 11/09/2012  . Obstructive sleep apnea 09/11/2012  . HTN (hypertension) 09/11/2012  . Impaired fasting  glucose 09/11/2012  . Degenerative arthritis of left knee 06/03/2011  . S/P lateral meniscectomy of left knee 06/03/2011  . BACK PAIN 05/01/2009  . DERANGEMENT MENISCUS 09/04/2007  . ELBOW PAIN, BILATERAL 04/06/2007  . DEGENERATIVE JOINT DISEASE, KNEE 03/13/2007  . Pain in joint, lower leg 03/13/2007   Ailene Ravel, OTR/L,CBIS  [phone]  11/21/2018, 11:43 AM  [place], [place], [address] Phone: [phone]   Fax:  [phone]  Name: Adam Hahn MRN: [id number] Date of Birth: 1965/06/28

## 2018-11-28 ENCOUNTER — Ambulatory Visit (HOSPITAL_COMMUNITY): Payer: No Typology Code available for payment source

## 2018-11-28 ENCOUNTER — Other Ambulatory Visit: Payer: Self-pay

## 2018-11-28 ENCOUNTER — Other Ambulatory Visit: Payer: Self-pay | Admitting: Family Medicine

## 2018-11-28 DIAGNOSIS — M25611 Stiffness of right shoulder, not elsewhere classified: Secondary | ICD-10-CM

## 2018-11-28 DIAGNOSIS — M25511 Pain in right shoulder: Secondary | ICD-10-CM

## 2018-11-28 DIAGNOSIS — R29898 Other symptoms and signs involving the musculoskeletal system: Secondary | ICD-10-CM

## 2018-11-28 NOTE — Therapy (Signed)
[place], [place], [address] Phone: [phone]   Fax:  [phone]  Occupational Therapy Treatment  Patient Details  Name: Adam Hahn MRN: [id number] Date of Birth: [birth year] Referring Provider (OT): Dr. Justice Britain   Encounter Date: 11/28/2018  OT End of Session - 11/28/18 1109    Visit Number  6    Number of Visits  20    Date for OT Re-Evaluation  12/06/18    Authorization Type  Patient is not filing with [employer] ($30 co pay- 60 visit limit). He is only filing with [facility].    Authorization Time Period  [facility] approved 15 visits. Request additional visits when needed.    Authorization - Visit Number  6    Authorization - Number of Visits  15    OT Start Time  6073    OT Stop Time  1115    OT Time Calculation (min)  40 min    Activity Tolerance  Patient tolerated treatment well    Behavior During Therapy  WFL for tasks assessed/performed       Past Medical History:  Diagnosis Date  . Allergic rhinitis   . Arthritis    knees,  right shoulder ac joint  . History of exercise stress test 10-09-2001 in epic   normal  . HTN (hypertension)   . OSA on CPAP   . Shoulder impingement, right   . Type 2 diabetes mellitus ([place])    followed by pcp  . Wears glasses     Past Surgical History:  Procedure Laterality Date  . KNEE SURGERY Left x4  last one 1996  . LAPAROSCOPIC CHOLECYSTECTOMY  09-20-2007  @[facility]   . SHOULDER ARTHROSCOPY WITH ROTATOR CUFF REPAIR Right 10/17/2018   Procedure: Right shoulder arthroscopy with subacromial decompression, distal clavicle resection,  rotator cuff DEBRIEDMENT, LABRIAL DEBRIDMENT;  Surgeon: Justice Britain, MD;  Location: [facility];  Service: Orthopedics;  Laterality: Right;  144min    There were no vitals filed for this visit.      [facility] OT Assessment - 11/28/18 1050      Assessment   Medical Diagnosis  Right shoulder SA, SAD, DCR      Precautions   Precautions  Shoulder    Precaution Comments  6/30: UPDATE: At last MD appointment he is able to D/C the sling and use his arm within pain tolerance.                 OT Treatments/Exercises (OP) - 11/28/18 1051      Exercises   Exercises  Shoulder      Shoulder Exercises: Supine   Protraction  PROM;5 reps;AROM;12 reps    Horizontal ABduction  PROM;5 reps;AROM;12 reps    External Rotation  PROM;5 reps;AROM;12 reps    Internal Rotation  PROM;5 reps;AROM;12 reps    Flexion  PROM;5 reps;AROM;12 reps    ABduction  PROM;5 reps;AROM;12 reps      Shoulder Exercises: Standing   Protraction  AROM;12 reps    Horizontal ABduction  AROM;12 reps    External Rotation  AROM;12 reps    Internal Rotation  AROM;12 reps    Flexion  AROM;12 reps    ABduction  AROM;12 reps    Extension  Theraband;12 reps    Theraband Level (Shoulder Extension)  Level 2 (Red)    Row  Theraband;12 reps    Theraband Level (Shoulder Row)  Level 2 (Red)    Retraction  Theraband;12  reps    Theraband Level (Shoulder Retraction)  Level 2 (Red)      Shoulder Exercises: ROM/Strengthening   Rebounder  --    UBE (Upper Arm Bike)  Level 3 2' forward 2' reverse   pace: 9.0-10.00   Over Head Lace  seated 2'    Proximal Shoulder Strengthening, Supine  12X A/ROM no rest breaks    Proximal Shoulder Strengthening, Seated  12X A/ROM no rest breaks    Other ROM/Strengthening Exercises  Proximal shoulder strengthening against wall with washcloth; 90 degrees shoulder flexion; 1'; abduction 1'      Manual Therapy   Manual Therapy  Myofascial release    Manual therapy comments  Completed separate from exercises.     Myofascial Release  Myofascial release and manual stretching to right upper arm, trapezius, and scapularis region to decrease fascial restrictions and increased joint mobility in a pain free zone.              OT Education - 11/28/18 1058    Education Details  red scapular theraband strengthening     Person(s) Educated  Patient    Methods  Explanation;Demonstration;Verbal cues;Handout    Comprehension  Returned demonstration;Verbalized understanding       OT Short Term Goals - 11/28/18 1127      OT SHORT TERM GOAL #1   Title  Patient will be educated and independent with HEP to increase functional use of his RUE be able to return to utilizing it as his dominant extremity for all daily tasks.    Time  6    Period  Weeks    Status  On-going    Target Date  12/06/18      OT SHORT TERM GOAL #2   Title  Patient will increase RUE P/ROM to [facility] in order to increase ability to bath himself and wash his face without assistance.    Time  6    Period  Weeks      OT SHORT TERM GOAL #3   Title  Patient will increase RUE strength to 3/5 in order to complete waist level activities without difficulty.    Time  6    Period  Weeks      OT SHORT TERM GOAL #4   Title  Patient will decrease pain level to 6/10 or less during functional use and low level reaching tasks.    Time  6    Period  Weeks      OT SHORT TERM GOAL #5   Title  Patient will decrease fascial restrictions to mod amount or less in order to increase functional mobility needed in the RUE to complete low level reaching.    Time  6    Period  Weeks        OT Long Term Goals - 10/31/18 1203      OT LONG TERM GOAL #1   Title  Patient will return to highest level of independence using his RUE for all work and daily tasks while progressing as tolerated.    Time  12    Period  Weeks    Status  On-going      OT LONG TERM GOAL #2   Title  Patient will increase A/ROM of his RUE to [facility] in order to complete reaching tasks above his head with no difficulty.    Time  12    Period  Weeks    Status  On-going      OT LONG TERM  GOAL #3   Title  Patient will increase his RUE strength to 4+/5 in order to return to work and begin his usual job requirements without restrictions.    Time  12    Period  Weeks    Status  On-going       OT LONG TERM GOAL #4   Title  Patient will decrease his fascial restrictions to min amount or less in order to increase functional mobility needed to complete reaching tasks.    Time  12    Period  Weeks    Status  On-going      OT LONG TERM GOAL #5   Title  patient will decrease pain level in RUE to 3/10 or less during functional daily tasks.    Time  12    Period  Weeks    Status  On-going            Plan - 11/28/18 1125    Clinical Impression Statement  A: Progressed to supine and standing A/ROM with scapular theraband exercises provided for HEP. Patient with small trigger point in right anterior deltoid region. Manual techniques completed to address. Muscle pain noted with abduction proximal shoulder strengthening. VC for form and technique were provided as needed.    Body Structure / Function / Physical Skills  ADL;ROM;Scar mobility;IADL;Fascial restriction;Flexibility;Strength;Pain;UE functional use;GMC    Plan  P: Re-cert with measurements and goal progress. Measurements were recently taken on 7/21 and may not show much improvement due to short amount of time in between. patient to begin 2 x a week next session. Progress to green theraband for scapular strengthening and provide for use at home.    Consulted and Agree with Plan of Care  Patient       Patient will benefit from skilled therapeutic intervention in order to improve the following deficits and impairments:   Body Structure / Function / Physical Skills: ADL, ROM, Scar mobility, IADL, Fascial restriction, Flexibility, Strength, Pain, UE functional use, GMC       Visit Diagnosis: 1. Other symptoms and signs involving the musculoskeletal system   2. Acute pain of right shoulder   3. Stiffness of right shoulder, not elsewhere classified       Problem List Patient Active Problem List   Diagnosis Date Noted  . Newly diagnosed diabetes ([place]) 01/13/2017  . Type 2 diabetes mellitus without complications ([place])  15/08/6977  . Prediabetes 05/24/2016  . Patellar tendinitis of left knee 12/11/2013  . Morbid obesity ([place]) 05/09/2013  . Osteoarthritis of left knee 11/09/2012  . Obstructive sleep apnea 09/11/2012  . HTN (hypertension) 09/11/2012  . Impaired fasting glucose 09/11/2012  . Degenerative arthritis of left knee 06/03/2011  . S/P lateral meniscectomy of left knee 06/03/2011  . BACK PAIN 05/01/2009  . DERANGEMENT MENISCUS 09/04/2007  . ELBOW PAIN, BILATERAL 04/06/2007  . DEGENERATIVE JOINT DISEASE, KNEE 03/13/2007  . Pain in joint, lower leg 03/13/2007   Ailene Ravel, OTR/L,CBIS  [phone]  11/28/2018, 11:28 AM  [facility] [address] [place], [place], [address] Phone: [phone]   Fax:  [phone]  Name: Adam Hahn MRN: [id number] Date of Birth: 21-Sep-1965

## 2018-11-28 NOTE — Telephone Encounter (Signed)
sched six mo f u, thenfamy fil times one

## 2018-11-28 NOTE — Patient Instructions (Signed)

## 2018-11-29 NOTE — Telephone Encounter (Signed)
Patient has appointment scheduled for 8/11.

## 2018-11-30 ENCOUNTER — Ambulatory Visit: Payer: BLUE CROSS/BLUE SHIELD | Admitting: Family Medicine

## 2018-12-05 ENCOUNTER — Other Ambulatory Visit: Payer: Self-pay

## 2018-12-05 ENCOUNTER — Ambulatory Visit (HOSPITAL_COMMUNITY): Payer: No Typology Code available for payment source | Attending: Orthopedic Surgery

## 2018-12-05 ENCOUNTER — Encounter (HOSPITAL_COMMUNITY): Payer: Self-pay

## 2018-12-05 DIAGNOSIS — M25611 Stiffness of right shoulder, not elsewhere classified: Secondary | ICD-10-CM | POA: Diagnosis present

## 2018-12-05 DIAGNOSIS — M25511 Pain in right shoulder: Secondary | ICD-10-CM | POA: Insufficient documentation

## 2018-12-05 DIAGNOSIS — R29898 Other symptoms and signs involving the musculoskeletal system: Secondary | ICD-10-CM | POA: Diagnosis present

## 2018-12-05 NOTE — Therapy (Signed)
[place], [place], [address] Phone: [phone]   Fax:  [phone]  Occupational Therapy Treatment Reassessment/re-cert Patient Details  Name: Adam Hahn MRN: [id number] Date of Birth: 06-13-1965 Referring Provider (OT): Dr. Justice Britain   Encounter Date: 12/05/2018  OT End of Session - 12/05/18 1059    Visit Number  7    Number of Visits  20    Date for OT Re-Evaluation  01/30/19   mini reassess: 01/02/19   Authorization Type  Patient is not filing with [employer] ($30 co pay- 60 visit limit). He is only filing with [facility].    Authorization Time Period  [facility] approved 15 visits. Request additional visits when needed.    Authorization - Visit Number  7    Authorization - Number of Visits  15    OT Start Time  5102   reassessment/re-cert   OT Stop Time  1112    OT Time Calculation (min)  39 min    Activity Tolerance  Patient tolerated treatment well    Behavior During Therapy  WFL for tasks assessed/performed       Past Medical History:  Diagnosis Date  . Allergic rhinitis   . Arthritis    knees,  right shoulder ac joint  . History of exercise stress test 10-09-2001 in epic   normal  . HTN (hypertension)   . OSA on CPAP   . Shoulder impingement, right   . Type 2 diabetes mellitus ([place])    followed by pcp  . Wears glasses     Past Surgical History:  Procedure Laterality Date  . KNEE SURGERY Left x4  last one 1996  . LAPAROSCOPIC CHOLECYSTECTOMY  09-20-2007  _0   . SHOULDER ARTHROSCOPY WITH ROTATOR CUFF REPAIR Right 10/17/2018   Procedure: Right shoulder arthroscopy with subacromial decompression, distal clavicle resection,  rotator cuff DEBRIEDMENT, LABRIAL DEBRIDMENT;  Surgeon: Justice Britain, MD;  Location: [facility];  Service: Orthopedics;  Laterality: Right;  147mn    There were no vitals filed for this visit.  Subjective Assessment - 12/05/18 1050    Subjective   S: After last  session, my arm was throbbing.    Currently in Pain?  Yes    Pain Score  5     Pain Location  Shoulder    Pain Orientation  Right    Pain Descriptors / Indicators  Sore;Constant    Pain Type  Chronic pain    Pain Radiating Towards  Wraps around the entire shoulder joint.    Pain Onset  In the past 7 days    Pain Frequency  Constant    Aggravating Factors   New exercises at therapy    Pain Relieving Factors  ice machine, pain medication    Effect of Pain on Daily Activities  No effect on general movement and use.    Multiple Pain Sites  No         [facility] OT Assessment - 12/05/18 1051      Assessment   Medical Diagnosis  Right shoulder SA, SAD, DCR    Referring Provider (OT)  Dr. KJustice Britain   Onset Date/Surgical Date  10/17/18    Next MD Visit  12/11/18      Precautions   Precautions  Shoulder    Precaution Comments  6/30: UPDATE: At last MD appointment he is able to D/C the sling and use his arm within pain tolerance.  Prior Function   Level of Independence  Independent      ROM / Strength   AROM / PROM / Strength  AROM;PROM;Strength      Palpation   Palpation comment  Moderate fascial restrictions noted in the right upper arm, trapezius, and scapularis region      AROM   Overall AROM Comments  Assessed standing. A/ROM not assessed prior to this date. IR/er adducted    AROM Assessment Site  Shoulder    Right/Left Shoulder  Right    Right Shoulder Flexion  155 Degrees   previous: 150   Right Shoulder ABduction  150 Degrees   previous: 105   Right Shoulder Internal Rotation  90 Degrees   previuos: same   Right Shoulder External Rotation  65 Degrees   previous: 62     PROM   Overall PROM   Within functional limits for tasks performed    Overall PROM Comments  Assessed supine. er abducted. Adducted at eval.     PROM Assessment Site  Shoulder    Right/Left Shoulder  Right    Right Shoulder Flexion  180 Degrees   previous: 45   Right Shoulder ABduction  180  Degrees   previous: 74   Right Shoulder Internal Rotation  90 Degrees   previous: same   Right Shoulder External Rotation  60 Degrees   previous: 32 adducted     Strength   Overall Strength Comments  Assessed standing. IR/er adducted. Strength note assessed prior to this date.     Strength Assessment Site  Shoulder    Right/Left Shoulder  Right    Right Shoulder Flexion  4/5   previous: 3/5   Right Shoulder ABduction  4/5   previous: 3-/5   Right Shoulder Internal Rotation  4/5   previous: 3+/5   Right Shoulder External Rotation  4/5   previous: 3+/5              OT Treatments/Exercises (OP) - 12/05/18 1056      Exercises   Exercises  Shoulder      Shoulder Exercises: Supine   Protraction  PROM;5 reps;AROM;12 reps    Horizontal ABduction  PROM;5 reps;AROM;12 reps    External Rotation  PROM;5 reps;AROM;12 reps    Internal Rotation  PROM;5 reps;AROM;12 reps    Flexion  PROM;5 reps;AROM;12 reps    ABduction  PROM;5 reps;AROM;12 reps      Shoulder Exercises: Standing   Protraction  AROM;12 reps    Horizontal ABduction  AROM;12 reps    External Rotation  AROM;12 reps    Internal Rotation  AROM;12 reps    Flexion  AROM;12 reps    ABduction  AROM;12 reps    Extension  Theraband;12 reps    Theraband Level (Shoulder Extension)  Level 2 (Red)    Row  Theraband;12 reps    Theraband Level (Shoulder Row)  Level 2 (Red)    Retraction  Theraband;12 reps    Theraband Level (Shoulder Retraction)  Level 2 (Red)      Shoulder Exercises: ROM/Strengthening   UBE (Upper Arm Bike)  Level 3 2' forward 2' reverse   Pace: 9.0-10.0   Wall Wash  1'    Over Head Lace  seated 2'    Proximal Shoulder Strengthening, Supine  12X A/ROM no rest breaks    Proximal Shoulder Strengthening, Seated  12X A/ROM no rest breaks    Other ROM/Strengthening Exercises  Proximal shoulder strengthening against wall with washcloth; 90 degrees   shoulder flexion; 1'; abduction 1'      Manual Therapy    Manual Therapy  Myofascial release    Manual therapy comments  Completed separate from exercises.     Myofascial Release  Myofascial release and manual stretching to right upper arm, trapezius, and scapularis region to decrease fascial restrictions and increased joint mobility in a pain free zone.                OT Short Term Goals - 11/28/18 1127      OT SHORT TERM GOAL #1   Title  Patient will be educated and independent with HEP to increase functional use of his RUE be able to return to utilizing it as his dominant extremity for all daily tasks.    Time  6    Period  Weeks    Status  On-going    Target Date  12/06/18      OT SHORT TERM GOAL #2   Title  Patient will increase RUE P/ROM to [facility] in order to increase ability to bath himself and wash his face without assistance.    Time  6    Period  Weeks      OT SHORT TERM GOAL #3   Title  Patient will increase RUE strength to 3/5 in order to complete waist level activities without difficulty.    Time  6    Period  Weeks      OT SHORT TERM GOAL #4   Title  Patient will decrease pain level to 6/10 or less during functional use and low level reaching tasks.    Time  6    Period  Weeks      OT SHORT TERM GOAL #5   Title  Patient will decrease fascial restrictions to mod amount or less in order to increase functional mobility needed in the RUE to complete low level reaching.    Time  6    Period  Weeks        OT Long Term Goals - 10/31/18 1203      OT LONG TERM GOAL #1   Title  Patient will return to highest level of independence using his RUE for all work and daily tasks while progressing as tolerated.    Time  12    Period  Weeks    Status  On-going      OT LONG TERM GOAL #2   Title  Patient will increase A/ROM of his RUE to [facility] in order to complete reaching tasks above his head with no difficulty.    Time  12    Period  Weeks    Status  On-going      OT LONG TERM GOAL #3   Title  Patient will increase his RUE  strength to 4+/5 in order to return to work and begin his usual job requirements without restrictions.    Time  12    Period  Weeks    Status  On-going      OT LONG TERM GOAL #4   Title  Patient will decrease his fascial restrictions to min amount or less in order to increase functional mobility needed to complete reaching tasks.    Time  12    Period  Weeks    Status  On-going      OT LONG TERM GOAL #5   Title  patient will decrease pain level in RUE to 3/10 or less during functional daily tasks.    Time  12    Period  Weeks    Status  On-going            Plan - 12/05/18 1057    Clinical Impression Statement  A: Reassessment/re-cert completed this date. From previous mini reassessment: Pt has met all short term goals with the HEP goal still on-going. He is demonstrating full passive ROM and WFL for A/ROM. He reports that he is using it as much as he can with the exception of lifting. He attempted to lift a pitcher of tea that was not full and he was unable to do so. He reports his pain is still consistant and he will experience periods of sharp stabbing pain in his shoulder blade. Continued with previous exercises as patient reports throbbing since previous session. VC for form and technique were provided. pt is now increasing to 2 times a week for 8 weeks per MD order.    Body Structure / Function / Physical Skills  ADL;ROM;Scar mobility;IADL;Fascial restriction;Flexibility;Strength;Pain;UE functional use;GMC    OT Frequency  2x / week    OT Duration  8 weeks    Plan  P: Progress to green theraband for scapular strengthening if able to tolerate.    Consulted and Agree with Plan of Care  Patient       Patient will benefit from skilled therapeutic intervention in order to improve the following deficits and impairments:   Body Structure / Function / Physical Skills: ADL, ROM, Scar mobility, IADL, Fascial restriction, Flexibility, Strength, Pain, UE functional use, GMC        Visit Diagnosis: 1. Other symptoms and signs involving the musculoskeletal system   2. Acute pain of right shoulder   3. Stiffness of right shoulder, not elsewhere classified       Problem List Patient Active Problem List   Diagnosis Date Noted  . Newly diagnosed diabetes ([place]) 01/13/2017  . Type 2 diabetes mellitus without complications ([place]) 02/63/7858  . Prediabetes 05/24/2016  . Patellar tendinitis of left knee 12/11/2013  . Morbid obesity ([place]) 05/09/2013  . Osteoarthritis of left knee 11/09/2012  . Obstructive sleep apnea 09/11/2012  . HTN (hypertension) 09/11/2012  . Impaired fasting glucose 09/11/2012  . Degenerative arthritis of left knee 06/03/2011  . S/P lateral meniscectomy of left knee 06/03/2011  . BACK PAIN 05/01/2009  . DERANGEMENT MENISCUS 09/04/2007  . ELBOW PAIN, BILATERAL 04/06/2007  . DEGENERATIVE JOINT DISEASE, KNEE 03/13/2007  . Pain in joint, lower leg 03/13/2007   Ailene Ravel, OTR/L,CBIS  [phone]  12/05/2018, 11:31 AM  [facility] [address] [place], [place], [address] Phone: [phone]   Fax:  [phone]  Name: KIMBERLY COYE MRN: [id number] Date of Birth: 07-28-1965

## 2018-12-05 NOTE — Addendum Note (Signed)
Addended by: Ailene Ravel D on: 12/05/2018 11:50 AM   Modules accepted: Orders

## 2018-12-07 ENCOUNTER — Ambulatory Visit (HOSPITAL_COMMUNITY): Payer: No Typology Code available for payment source

## 2018-12-12 ENCOUNTER — Ambulatory Visit (HOSPITAL_COMMUNITY): Payer: No Typology Code available for payment source

## 2018-12-12 ENCOUNTER — Encounter: Payer: Self-pay | Admitting: Family Medicine

## 2018-12-12 ENCOUNTER — Encounter (HOSPITAL_COMMUNITY): Payer: Self-pay

## 2018-12-12 ENCOUNTER — Ambulatory Visit (INDEPENDENT_AMBULATORY_CARE_PROVIDER_SITE_OTHER): Payer: BC Managed Care – PPO | Admitting: Family Medicine

## 2018-12-12 ENCOUNTER — Other Ambulatory Visit: Payer: Self-pay

## 2018-12-12 DIAGNOSIS — I10 Essential (primary) hypertension: Secondary | ICD-10-CM

## 2018-12-12 DIAGNOSIS — M25611 Stiffness of right shoulder, not elsewhere classified: Secondary | ICD-10-CM

## 2018-12-12 DIAGNOSIS — G4733 Obstructive sleep apnea (adult) (pediatric): Secondary | ICD-10-CM

## 2018-12-12 DIAGNOSIS — M25511 Pain in right shoulder: Secondary | ICD-10-CM

## 2018-12-12 DIAGNOSIS — R29898 Other symptoms and signs involving the musculoskeletal system: Secondary | ICD-10-CM | POA: Diagnosis not present

## 2018-12-12 DIAGNOSIS — E119 Type 2 diabetes mellitus without complications: Secondary | ICD-10-CM | POA: Diagnosis not present

## 2018-12-12 MED ORDER — METFORMIN HCL 1000 MG PO TABS: 1000.0000 mg | ORAL_TABLET | Freq: Two times a day (BID) | ORAL | 1 refills | Status: DC

## 2018-12-12 NOTE — Therapy (Signed)
[place], [place], [address] Phone: [phone]   Fax:  [phone]  Occupational Therapy Treatment  Patient Details  Name: Adam Hahn MRN: [id number] Date of Birth: 1965/06/01 Referring Provider (OT): Dr. Justice Britain   Encounter Date: 12/12/2018  OT End of Session - 12/12/18 1106    Visit Number  8    Number of Visits  20    Date for OT Re-Evaluation  01/30/19   mini reassess: 01/02/19   Authorization Type  Patient is not filing with [employer] ($30 co pay- 60 visit limit). He is only filing with [facility].    Authorization Time Period  [facility] approved 15 visits. Request additional visits when needed.    Authorization - Visit Number  8    Authorization - Number of Visits  15    OT Start Time  1324    OT Stop Time  1113    OT Time Calculation (min)  38 min    Activity Tolerance  Patient tolerated treatment well    Behavior During Therapy  WFL for tasks assessed/performed       Past Medical History:  Diagnosis Date  . Allergic rhinitis   . Arthritis    knees,  right shoulder ac joint  . History of exercise stress test 10-09-2001 in epic   normal  . HTN (hypertension)   . OSA on CPAP   . Shoulder impingement, right   . Type 2 diabetes mellitus ([place])    followed by pcp  . Wears glasses     Past Surgical History:  Procedure Laterality Date  . KNEE SURGERY Left x4  last one 1996  . LAPAROSCOPIC CHOLECYSTECTOMY  09-20-2007  @[facility]   . SHOULDER ARTHROSCOPY WITH ROTATOR CUFF REPAIR Right 10/17/2018   Procedure: Right shoulder arthroscopy with subacromial decompression, distal clavicle resection,  rotator cuff DEBRIEDMENT, LABRIAL DEBRIDMENT;  Surgeon: Justice Britain, MD;  Location: [facility];  Service: Orthopedics;  Laterality: Right;  166min    There were no vitals filed for this visit.  Subjective Assessment - 12/12/18 1045    Subjective   S: The Doctor said everything looks good. I see him again on  01/10/19.    Currently in Pain?  Yes    Pain Score  3     Pain Location  Shoulder    Pain Orientation  Right    Pain Descriptors / Indicators  Sore;Constant    Pain Type  Chronic pain    Pain Radiating Towards  Wraps around the shoulder joint    Pain Onset  In the past 7 days    Pain Frequency  Constant    Aggravating Factors   movement and use    Pain Relieving Factors  ice machine and pain medication    Effect of Pain on Daily Activities  no effect on general movement and use.    Multiple Pain Sites  No         [facility] OT Assessment - 12/12/18 1047      Assessment   Medical Diagnosis  Right shoulder SA, SAD, DCR    Next MD Visit  01/10/19      Precautions   Precautions  Shoulder    Type of Shoulder Precautions  Progress as tolerated               OT Treatments/Exercises (OP) - 12/12/18 1047      Exercises   Exercises  Shoulder  Shoulder Exercises: Supine   Protraction  PROM;5 reps;AROM;15 reps    Horizontal ABduction  PROM;5 reps;AROM;15 reps    External Rotation  PROM;5 reps;AROM;15 reps    Internal Rotation  PROM;5 reps;AROM;15 reps    Flexion  PROM;5 reps;AROM;15 reps    ABduction  PROM;5 reps;AROM;15 reps      Shoulder Exercises: Standing   Horizontal ABduction  AROM;15 reps    Flexion  AROM;15 reps    ABduction  AROM;15 reps    Extension  Theraband;12 reps    Theraband Level (Shoulder Extension)  Level 3 (Green)    Row  [employer] reps    Theraband Level (Shoulder Row)  Level 3 (Green)    Retraction  Theraband;12 reps    Theraband Level (Shoulder Retraction)  Level 3 (Green)      Shoulder Exercises: Therapy Ball   Right/Left  5 reps    Other Therapy Ball Exercises  Green therapy ball: chest press, flexion 10X      Shoulder Exercises: ROM/Strengthening   UBE (Upper Arm Bike)  Level 3 2' forward 2' reverse   pace: 9.0-10.0   Over Head Lace  seated 2'    X to V Arms  12X A/ROM    Proximal Shoulder Strengthening, Supine  15X A/ROM no rest  breaks    Proximal Shoulder Strengthening, Seated  15X A/ROM no rest breaks    Ball on Wall  1' flexion 1' abduction green ball    Other ROM/Strengthening Exercises  Y arm lift wall lift off; 10X      Manual Therapy   Manual Therapy  Myofascial release    Manual therapy comments  Completed separate from exercises.     Myofascial Release  Myofascial release and manual stretching to right upper arm, trapezius, and scapularis region to decrease fascial restrictions and increased joint mobility in a pain free zone.              OT Education - 12/12/18 1106    Education Details  Upgraded red theraband to green for HEP.    Person(s) Educated  Patient    Methods  Explanation    Comprehension  Verbalized understanding       OT Short Term Goals - 11/28/18 1127      OT SHORT TERM GOAL #1   Title  Patient will be educated and independent with HEP to increase functional use of his RUE be able to return to utilizing it as his dominant extremity for all daily tasks.    Time  6    Period  Weeks    Status  On-going    Target Date  12/06/18      OT SHORT TERM GOAL #2   Title  Patient will increase RUE P/ROM to [facility] in order to increase ability to bath himself and wash his face without assistance.    Time  6    Period  Weeks      OT SHORT TERM GOAL #3   Title  Patient will increase RUE strength to 3/5 in order to complete waist level activities without difficulty.    Time  6    Period  Weeks      OT SHORT TERM GOAL #4   Title  Patient will decrease pain level to 6/10 or less during functional use and low level reaching tasks.    Time  6    Period  Weeks      OT SHORT TERM GOAL #5   Title  Patient will decrease fascial restrictions to mod amount or less in order to increase functional mobility needed in the RUE to complete low level reaching.    Time  6    Period  Weeks        OT Long Term Goals - 10/31/18 1203      OT LONG TERM GOAL #1   Title  Patient will return to highest  level of independence using his RUE for all work and daily tasks while progressing as tolerated.    Time  12    Period  Weeks    Status  On-going      OT LONG TERM GOAL #2   Title  Patient will increase A/ROM of his RUE to [facility] in order to complete reaching tasks above his head with no difficulty.    Time  12    Period  Weeks    Status  On-going      OT LONG TERM GOAL #3   Title  Patient will increase his RUE strength to 4+/5 in order to return to work and begin his usual job requirements without restrictions.    Time  12    Period  Weeks    Status  On-going      OT LONG TERM GOAL #4   Title  Patient will decrease his fascial restrictions to min amount or less in order to increase functional mobility needed to complete reaching tasks.    Time  12    Period  Weeks    Status  On-going      OT LONG TERM GOAL #5   Title  patient will decrease pain level in RUE to 3/10 or less during functional daily tasks.    Time  12    Period  Weeks    Status  On-going            Plan - 12/12/18 1150    Clinical Impression Statement  A: Progressed to green theraband for scapular strengthening, added therapy ball strengthening, X to V arms, and Y arm lift off to focus on scapular stability and strengthening. Pt require VC for form and technique Occassional rest breaks were taken for muscle fatigue.    Body Structure / Function / Physical Skills  ADL;ROM;Scar mobility;IADL;Fascial restriction;Flexibility;Strength;Pain;UE functional use;GMC    Plan  P: Complete sidelying A/ROM next session.    Consulted and Agree with Plan of Care  Patient       Patient will benefit from skilled therapeutic intervention in order to improve the following deficits and impairments:   Body Structure / Function / Physical Skills: ADL, ROM, Scar mobility, IADL, Fascial restriction, Flexibility, Strength, Pain, UE functional use, GMC       Visit Diagnosis: 1. Acute pain of right shoulder   2. Stiffness of right  shoulder, not elsewhere classified   3. Other symptoms and signs involving the musculoskeletal system       Problem List Patient Active Problem List   Diagnosis Date Noted  . Newly diagnosed diabetes ([place]) 01/13/2017  . Type 2 diabetes mellitus without complications ([place]) 35/46/5681  . Prediabetes 05/24/2016  . Patellar tendinitis of left knee 12/11/2013  . Morbid obesity ([place]) 05/09/2013  . Osteoarthritis of left knee 11/09/2012  . Obstructive sleep apnea 09/11/2012  . HTN (hypertension) 09/11/2012  . Impaired fasting glucose 09/11/2012  . Degenerative arthritis of left knee 06/03/2011  . S/P lateral meniscectomy of left knee 06/03/2011  . BACK PAIN 05/01/2009  . DERANGEMENT MENISCUS  09/04/2007  . ELBOW PAIN, BILATERAL 04/06/2007  . DEGENERATIVE JOINT DISEASE, KNEE 03/13/2007  . Pain in joint, lower leg 03/13/2007   Ailene Ravel, OTR/L,CBIS  [phone]  12/12/2018, 11:53 AM  [place], [place], [address] Phone: [phone]   Fax:  [phone]  Name: Adam Hahn MRN: [id number] Date of Birth: [birth year]

## 2018-12-12 NOTE — Progress Notes (Signed)
   Subjective:  Audio plus video  Patient ID: Adam Hahn, male    DOB: [birth year], 53 y.o.   MRN: [id number]  Diabetes He presents for his follow-up diabetic visit. He has type 2 diabetes mellitus. Current diabetic treatments: metformin 500mg  one daily. Home blood sugar record trend: checks sugar once a week and states it has been running good.   Pt asked for a diuretic for fluid around his abdomen.   Would like 90 day supply of metformin.   Virtual Visit via Video Note  I connected with Adam Hahn on 12/12/18 at  8:30 AM EDT by a video enabled telemedicine application and verified that I am speaking with the correct person using two identifiers.  Location: Patient: home Provider: office   I discussed the limitations of evaluation and management by telemedicine and the availability of in person appointments. The patient expressed understanding and agreed to proceed.  History of Present Illness:    Observations/Objective:   Assessment and Plan:   Follow Up Instructions:    I discussed the assessment and treatment plan with the patient. The patient was provided an opportunity to ask questions and all were answered. The patient agreed with the plan and demonstrated an understanding of the instructions.   The patient was advised to call back or seek an in-person evaluation if the symptoms worsen or if the condition fails to improve as anticipated.  I provided 25 minutes of non-face-to-face time during this encounter.   Patient claims compliance with diabetes medication. No obvious side effects. Reports no substantial low sugar spells. Most numbers are generally in good range when checked fasting. Generally does not miss a dose of medication. Watching diabetic diet closely  Blood pressure medicine and blood pressure levels reviewed today with patient. Compliant with blood pressure medicine. States does not miss a dose. No obvious side effects. Blood pressure  generally good when checked elsewhere. Watching salt intake.   Patient notes enlarging abdomen.  Requests a fluid pill for "fluid in the abdomen" patient reports less exercise in recent months due to shoulder surgery.  Also admits to dietary noncompliance.  Also notes ankles have 0 swelling Review of Systems No headache, no major weight loss or weight gain, no chest pain no back pain abdominal pain no change in bowel habits complete ROS otherwise negative     Objective:   Physical Exam  Virtual      Assessment & Plan:  Impression 1 type 2 diabetes.  Apparent good control.  Discussed diet discussed compliance with meds discussed  2.  Hypertension.  Blood pressure excellent checked elsewhere compliance discussed side effects benefits discussed  3.  Fluid in abdomen"".  Advised no ankle edema likely not true fluid.  Long-term diuretics good on kidneys multiple questions answered  25 minutes spent with patient follow-up in 6 months diet exercise discussed medication refill

## 2018-12-14 ENCOUNTER — Ambulatory Visit (HOSPITAL_COMMUNITY): Payer: No Typology Code available for payment source

## 2018-12-14 ENCOUNTER — Other Ambulatory Visit: Payer: Self-pay

## 2018-12-14 ENCOUNTER — Encounter (HOSPITAL_COMMUNITY): Payer: Self-pay

## 2018-12-14 DIAGNOSIS — R29898 Other symptoms and signs involving the musculoskeletal system: Secondary | ICD-10-CM | POA: Diagnosis not present

## 2018-12-14 DIAGNOSIS — M25611 Stiffness of right shoulder, not elsewhere classified: Secondary | ICD-10-CM

## 2018-12-14 DIAGNOSIS — M25511 Pain in right shoulder: Secondary | ICD-10-CM

## 2018-12-14 NOTE — Therapy (Signed)
[place], [place], [address] Phone: [phone]   Fax:  [phone]  Occupational Therapy Treatment  Patient Details  Name: Adam Hahn MRN: [id number] Date of Birth: January 13, 1966 Referring Provider (OT): Dr. Justice Britain   Encounter Date: 12/14/2018  OT End of Session - 12/14/18 1111    Visit Number  9    Number of Visits  20    Date for OT Re-Evaluation  01/30/19   mini reassess: 01/02/19   Authorization Type  Patient is not filing with [employer] ($30 co pay- 60 visit limit). He is only filing with [facility].    Authorization Time Period  [facility] approved 15 visits. Request additional visits when needed.    Authorization - Visit Number  9    Authorization - Number of Visits  15    OT Start Time  0272    OT Stop Time  1115    OT Time Calculation (min)  42 min    Activity Tolerance  Patient tolerated treatment well    Behavior During Therapy  WFL for tasks assessed/performed       Past Medical History:  Diagnosis Date  . Allergic rhinitis   . Arthritis    knees,  right shoulder ac joint  . History of exercise stress test 10-09-2001 in epic   normal  . HTN (hypertension)   . OSA on CPAP   . Shoulder impingement, right   . Type 2 diabetes mellitus ([place])    followed by pcp  . Wears glasses     Past Surgical History:  Procedure Laterality Date  . KNEE SURGERY Left x4  last one 1996  . LAPAROSCOPIC CHOLECYSTECTOMY  09-20-2007  @[facility]   . SHOULDER ARTHROSCOPY WITH ROTATOR CUFF REPAIR Right 10/17/2018   Procedure: Right shoulder arthroscopy with subacromial decompression, distal clavicle resection,  rotator cuff DEBRIEDMENT, LABRIAL DEBRIDMENT;  Surgeon: Justice Britain, MD;  Location: [facility];  Service: Orthopedics;  Laterality: Right;  182min    There were no vitals filed for this visit.  Subjective Assessment - 12/14/18 1131    Subjective   S: It was sore after last session. I didn't feel until later  at night when I was sleeping.    Currently in Pain?  No/denies                   OT Treatments/Exercises (OP) - 12/14/18 0001      Exercises   Exercises  Shoulder      Shoulder Exercises: Supine   Protraction  PROM;5 reps    Horizontal ABduction  PROM;5 reps    External Rotation  PROM;5 reps    Internal Rotation  PROM;5 reps    Flexion  PROM;5 reps    ABduction  PROM;5 reps      Shoulder Exercises: Sidelying   External Rotation  AROM;15 reps    Internal Rotation  AROM;15 reps    Flexion  AROM;15 reps    ABduction  AROM;15 reps    Other Sidelying Exercises  protractions; A/ROM; 15X    Other Sidelying Exercises  Horizontal abductiona; A/ROM; 15X      Shoulder Exercises: Standing   Horizontal ABduction  AROM;15 reps    Flexion  AROM;15 reps    ABduction  AROM;15 reps    Extension  Theraband;12 reps    Theraband Level (Shoulder Extension)  Level 3 (Green)    Row  [employer] reps    Theraband Level (Shoulder Row)  Level 3 (Green)    Retraction  Theraband;12 reps    Theraband Level (Shoulder Retraction)  Level 3 (Green)      Shoulder Exercises: Therapy Ball   Right/Left  5 reps    Other Therapy Ball Exercises  Green therapy ball: circles, chest press, flexion 10X      Shoulder Exercises: ROM/Strengthening   UBE (Upper Arm Bike)  Level 3 2' forward 2' reverse   pace: 11.0-12.0   Over Head Lace  seated 2'    X to V Arms  12X A/ROM    Proximal Shoulder Strengthening, Seated  15X A/ROM no rest breaks    Ball on Wall  1' flexion 1' abduction green ball    Other ROM/Strengthening Exercises  Y arm lift wall lift off; 10X               OT Short Term Goals - 11/28/18 1127      OT SHORT TERM GOAL #1   Title  Patient will be educated and independent with HEP to increase functional use of his RUE be able to return to utilizing it as his dominant extremity for all daily tasks.    Time  6    Period  Weeks    Status  On-going    Target Date  12/06/18       OT SHORT TERM GOAL #2   Title  Patient will increase RUE P/ROM to [facility] in order to increase ability to bath himself and wash his face without assistance.    Time  6    Period  Weeks      OT SHORT TERM GOAL #3   Title  Patient will increase RUE strength to 3/5 in order to complete waist level activities without difficulty.    Time  6    Period  Weeks      OT SHORT TERM GOAL #4   Title  Patient will decrease pain level to 6/10 or less during functional use and low level reaching tasks.    Time  6    Period  Weeks      OT SHORT TERM GOAL #5   Title  Patient will decrease fascial restrictions to mod amount or less in order to increase functional mobility needed in the RUE to complete low level reaching.    Time  6    Period  Weeks        OT Long Term Goals - 10/31/18 1203      OT LONG TERM GOAL #1   Title  Patient will return to highest level of independence using his RUE for all work and daily tasks while progressing as tolerated.    Time  12    Period  Weeks    Status  On-going      OT LONG TERM GOAL #2   Title  Patient will increase A/ROM of his RUE to [facility] in order to complete reaching tasks above his head with no difficulty.    Time  12    Period  Weeks    Status  On-going      OT LONG TERM GOAL #3   Title  Patient will increase his RUE strength to 4+/5 in order to return to work and begin his usual job requirements without restrictions.    Time  12    Period  Weeks    Status  On-going      OT LONG TERM GOAL #4   Title  Patient will decrease  his fascial restrictions to min amount or less in order to increase functional mobility needed to complete reaching tasks.    Time  12    Period  Weeks    Status  On-going      OT LONG TERM GOAL #5   Title  patient will decrease pain level in RUE to 3/10 or less during functional daily tasks.    Time  12    Period  Weeks    Status  On-going            Plan - 12/14/18 1125    Clinical Impression Statement  A: Added  sidelying A/ROM exercises to continue to work on scapular strength and stability. Patient reports that he did experience some muscle soreness after previous session. Patient demonstrated mild muscle fatigue during session with occassional rest breaks. Able to complete all exercises presented. VC for form and technique were provided. Minimal fascial restrictions in Right upper arm with manual techniques completed to address.    Body Structure / Function / Physical Skills  ADL;ROM;Scar mobility;IADL;Fascial restriction;Flexibility;Strength;Pain;UE functional use;GMC    Plan  P: Attempt strengthening supine and standing with 1# handweight if able to tolerate.    Consulted and Agree with Plan of Care  Patient       Patient will benefit from skilled therapeutic intervention in order to improve the following deficits and impairments:   Body Structure / Function / Physical Skills: ADL, ROM, Scar mobility, IADL, Fascial restriction, Flexibility, Strength, Pain, UE functional use, GMC       Visit Diagnosis: 1. Stiffness of right shoulder, not elsewhere classified   2. Other symptoms and signs involving the musculoskeletal system   3. Acute pain of right shoulder       Problem List Patient Active Problem List   Diagnosis Date Noted  . Newly diagnosed diabetes ([place]) 01/13/2017  . Type 2 diabetes mellitus without complications ([place]) 85/46/2703  . Prediabetes 05/24/2016  . Patellar tendinitis of left knee 12/11/2013  . Morbid obesity ([place]) 05/09/2013  . Osteoarthritis of left knee 11/09/2012  . Obstructive sleep apnea 09/11/2012  . HTN (hypertension) 09/11/2012  . Impaired fasting glucose 09/11/2012  . Degenerative arthritis of left knee 06/03/2011  . S/P lateral meniscectomy of left knee 06/03/2011  . BACK PAIN 05/01/2009  . DERANGEMENT MENISCUS 09/04/2007  . ELBOW PAIN, BILATERAL 04/06/2007  . DEGENERATIVE JOINT DISEASE, KNEE 03/13/2007  . Pain in joint, lower leg 03/13/2007   Ailene Ravel, OTR/L,CBIS  [phone]  12/14/2018, 11:32 AM  [place], [place], [address] Phone: [phone]   Fax:  [phone]  Name: Adam Hahn MRN: [id number] Date of Birth: 17-Jan-1966

## 2018-12-19 ENCOUNTER — Other Ambulatory Visit: Payer: Self-pay

## 2018-12-19 ENCOUNTER — Ambulatory Visit (HOSPITAL_COMMUNITY): Payer: No Typology Code available for payment source

## 2018-12-19 ENCOUNTER — Encounter (HOSPITAL_COMMUNITY): Payer: BC Managed Care – PPO

## 2018-12-19 ENCOUNTER — Encounter (HOSPITAL_COMMUNITY): Payer: Self-pay

## 2018-12-19 DIAGNOSIS — M25511 Pain in right shoulder: Secondary | ICD-10-CM

## 2018-12-19 DIAGNOSIS — R29898 Other symptoms and signs involving the musculoskeletal system: Secondary | ICD-10-CM

## 2018-12-19 DIAGNOSIS — M25611 Stiffness of right shoulder, not elsewhere classified: Secondary | ICD-10-CM

## 2018-12-19 NOTE — Therapy (Signed)
Adam Hahn, [place], [address] Phone: [phone]   Fax:  [phone]  Occupational Therapy Treatment  Patient Details  Name: Adam Hahn MRN: [id number] Date of Birth: 1965-08-26 Referring Provider (OT): Dr. Justice Britain   Encounter Date: 12/19/2018  OT End of Session - 12/19/18 1008    Visit Number  10    Number of Visits  20    Date for OT Re-Evaluation  01/30/19   mini reassess: 01/02/19   Authorization Type  Patient is not filing with [employer] ($30 co pay- 60 visit limit). He is only filing with [facility].    Authorization Time Period  [facility] approved 15 visits. Request additional visits when needed.    Authorization - Visit Number  10    Authorization - Number of Visits  15    OT Start Time  7867    OT Stop Time  1015    OT Time Calculation (min)  40 min    Activity Tolerance  Patient tolerated treatment well    Behavior During Therapy  WFL for tasks assessed/performed       Past Medical History:  Diagnosis Date  . Allergic rhinitis   . Arthritis    knees,  right shoulder ac joint  . History of exercise stress test 10-09-2001 in epic   normal  . HTN (hypertension)   . OSA on CPAP   . Shoulder impingement, right   . Type 2 diabetes mellitus ([place])    followed by pcp  . Wears glasses     Past Surgical History:  Procedure Laterality Date  . KNEE SURGERY Left x4  last one 1996  . LAPAROSCOPIC CHOLECYSTECTOMY  09-20-2007  @[facility]   . SHOULDER ARTHROSCOPY WITH ROTATOR CUFF REPAIR Right 10/17/2018   Procedure: Right shoulder arthroscopy with subacromial decompression, distal clavicle resection,  rotator cuff DEBRIEDMENT, LABRIAL DEBRIDMENT;  Surgeon: Justice Britain, MD;  Location: [facility];  Service: Orthopedics;  Laterality: Right;  15min    There were no vitals filed for this visit.  Subjective Assessment - 12/19/18 1021    Subjective   S: When I was laying in bed my shoulder would be like it  wasn't in the joint right.    Currently in Pain?  Yes    Pain Score  4     Pain Location  Shoulder    Pain Orientation  Right    Pain Descriptors / Indicators  Sore;Constant    Pain Type  Acute pain    Pain Radiating Towards  Wraps around the shoulder joint    Pain Onset  In the past 7 days    Pain Frequency  Constant    Aggravating Factors   movement and use    Pain Relieving Factors  ice machine and pain medication    Effect of Pain on Daily Activities  no effect on general movement and use    Multiple Pain Sites  No         [facility] OT Assessment - 12/19/18 0935      Assessment   Medical Diagnosis  Right shoulder SA, SAD, DCR      Precautions   Precautions  Shoulder    Type of Shoulder Precautions  Progress as tolerated               OT Treatments/Exercises (OP) - 12/19/18 0935      Exercises   Exercises  Shoulder      Shoulder Exercises:  Supine   Protraction  PROM;5 reps    Flexion  PROM;5 reps;Strengthening;12 reps    Shoulder Flexion Weight (lbs)  1    Other Supine Exercises  Serratus anterior punch; 12X; green theraband      Shoulder Exercises: Standing   Horizontal ABduction  Strengthening;12 reps    Horizontal ABduction Weight (lbs)  1    External Rotation  Strengthening;12 reps    External Rotation Weight (lbs)  1    Internal Rotation  Strengthening;12 reps    Internal Rotation Weight (lbs)  1    Flexion  Strengthening;12 reps    Shoulder Flexion Weight (lbs)  1    ABduction  Strengthening;12 reps    Shoulder ABduction Weight (lbs)  1    Other Standing Exercises  Serratus anterior punch; green theraband; 12X    Other Standing Exercises  Progressive scapular strengthening during green band pull; 5X total      Shoulder Exercises: ROM/Strengthening   UBE (Upper Arm Bike)  Level 3 2' forward 2' reverse   pace: 11.0-12.0   Over Head Lace  seated 2'    X to V Arms  12X A/ROM    Proximal Shoulder Strengthening, Supine  12X with 1# no rest breaks     Proximal Shoulder Strengthening, Seated  12X with 1# with no rest break    Ball on Wall  1' flexion 1' abduction green ball    Other ROM/Strengthening Exercises  Y arm lift wall lift off; 12X      Manual Therapy   Manual Therapy  Myofascial release    Manual therapy comments  Completed separate from exercises.     Myofascial Release  Myofascial release and manual stretching to right upper arm, trapezius, and scapularis region to decrease fascial restrictions and increased joint mobility in a pain free zone.                OT Short Term Goals - 11/28/18 1127      OT SHORT TERM GOAL #1   Title  Patient will be educated and independent with HEP to increase functional use of his RUE be able to return to utilizing it as his dominant extremity for all daily tasks.    Time  6    Period  Weeks    Status  On-going    Target Date  12/06/18      OT SHORT TERM GOAL #2   Title  Patient will increase RUE P/ROM to [facility] in order to increase ability to bath himself and wash his face without assistance.    Time  6    Period  Weeks      OT SHORT TERM GOAL #3   Title  Patient will increase RUE strength to 3/5 in order to complete waist level activities without difficulty.    Time  6    Period  Weeks      OT SHORT TERM GOAL #4   Title  Patient will decrease pain level to 6/10 or less during functional use and low level reaching tasks.    Time  6    Period  Weeks      OT SHORT TERM GOAL #5   Title  Patient will decrease fascial restrictions to mod amount or less in order to increase functional mobility needed in the RUE to complete low level reaching.    Time  6    Period  Weeks        OT Long Term Goals -  10/31/18 [place] #1   Title  Patient will return to highest level of independence using his RUE for all work and daily tasks while progressing as tolerated.    Time  12    Period  Weeks    Status  On-going      OT LONG TERM GOAL #2   Title  Patient will  increase A/ROM of his RUE to [facility] in order to complete reaching tasks above his head with no difficulty.    Time  12    Period  Weeks    Status  On-going      OT LONG TERM GOAL #3   Title  Patient will increase his RUE strength to 4+/5 in order to return to work and begin his usual job requirements without restrictions.    Time  12    Period  Weeks    Status  On-going      OT LONG TERM GOAL #4   Title  Patient will decrease his fascial restrictions to min amount or less in order to increase functional mobility needed to complete reaching tasks.    Time  12    Period  Weeks    Status  On-going      OT LONG TERM GOAL #5   Title  patient will decrease pain level in RUE to 3/10 or less during functional daily tasks.    Time  12    Period  Weeks    Status  On-going            Plan - 12/19/18 1009    Clinical Impression Statement  A: Pt reports that starting on Sunday, he became to experience some instability like his shoulder wasn't in the joint right. He felt this mainly when he was in bed trying to sleep. He also reports that his shoulder has locked up a few times. Session focused on strengthening scapular muscles in order to increase stability in shoulder joint. VC for form and technique were provided.    Plan  P: Follow up on instability feeling/locking up. Continue with scapular strengthening exercises. Provide patient with print out of serratus anterior punch while using green thereaband achnoered in door (pt already has band/anchor).    Consulted and Agree with Plan of Care  Patient       Patient will benefit from skilled therapeutic intervention in order to improve the following deficits and impairments:           Visit Diagnosis: 1. Stiffness of right shoulder, not elsewhere classified   2. Other symptoms and signs involving the musculoskeletal system   3. Acute pain of right shoulder       Problem List Patient Active Problem List   Diagnosis Date Noted  .  Newly diagnosed diabetes ([place]) 01/13/2017  . Type 2 diabetes mellitus without complications ([place]) 00/93/8182  . Prediabetes 05/24/2016  . Patellar tendinitis of left knee 12/11/2013  . Morbid obesity ([place]) 05/09/2013  . Osteoarthritis of left knee 11/09/2012  . Obstructive sleep apnea 09/11/2012  . HTN (hypertension) 09/11/2012  . Impaired fasting glucose 09/11/2012  . Degenerative arthritis of left knee 06/03/2011  . S/P lateral meniscectomy of left knee 06/03/2011  . BACK PAIN 05/01/2009  . DERANGEMENT MENISCUS 09/04/2007  . ELBOW PAIN, BILATERAL 04/06/2007  . DEGENERATIVE JOINT DISEASE, KNEE 03/13/2007  . Pain in joint, lower leg 03/13/2007   Ailene Ravel, OTR/L,CBIS  [phone]  12/19/2018, 10:22 AM  [facility]  [place], [place], [address] Phone: [phone]   Fax:  [phone]  Name: Adam Hahn MRN: [id number] Date of Birth: 16-Aug-1965

## 2018-12-21 ENCOUNTER — Other Ambulatory Visit: Payer: Self-pay

## 2018-12-21 ENCOUNTER — Ambulatory Visit (HOSPITAL_COMMUNITY): Payer: No Typology Code available for payment source | Admitting: Specialist

## 2018-12-21 ENCOUNTER — Encounter (HOSPITAL_COMMUNITY): Payer: Self-pay | Admitting: Specialist

## 2018-12-21 DIAGNOSIS — R29898 Other symptoms and signs involving the musculoskeletal system: Secondary | ICD-10-CM

## 2018-12-21 DIAGNOSIS — M25611 Stiffness of right shoulder, not elsewhere classified: Secondary | ICD-10-CM

## 2018-12-21 DIAGNOSIS — M25511 Pain in right shoulder: Secondary | ICD-10-CM

## 2018-12-21 NOTE — Patient Instructions (Signed)
Press: With a plus - Single (Dumbbell)    Press dumbbell over chest and protract shoulder at end while maintaining bridge. Repeat with other arm. Do _10___ repetitions 3 times per day   http://sb.exer.us/132   Copyright  VHI. All rights reserved.

## 2018-12-21 NOTE — Therapy (Signed)
[place], [place], [address] Phone: [phone]   Fax:  [phone]  Occupational Therapy Treatment  Patient Details  Name: Adam Hahn MRN: [id number] Date of Birth: 01/23/1966 Referring Provider (OT): Dr. Justice Britain   Encounter Date: 12/21/2018  OT End of Session - 12/21/18 1130    Visit Number  11    Number of Visits  20    Date for OT Re-Evaluation  01/30/19   mini reassess on 01/02/19   Authorization Type  Patient is not filing with [employer] ($30 co pay- 60 visit limit). He is only filing with [facility].    Authorization Time Period  [facility] approved 15 visits. Request additional visits when needed.    Authorization - Visit Number  11    Authorization - Number of Visits  15    OT Start Time  1025    OT Stop Time  1125    OT Time Calculation (min)  60 min    Activity Tolerance  Patient tolerated treatment well    Behavior During Therapy  WFL for tasks assessed/performed       Past Medical History:  Diagnosis Date  . Allergic rhinitis   . Arthritis    knees,  right shoulder ac joint  . History of exercise stress test 10-09-2001 in epic   normal  . HTN (hypertension)   . OSA on CPAP   . Shoulder impingement, right   . Type 2 diabetes mellitus ([place])    followed by pcp  . Wears glasses     Past Surgical History:  Procedure Laterality Date  . KNEE SURGERY Left x4  last one 1996  . LAPAROSCOPIC CHOLECYSTECTOMY  09-20-2007  @[facility]   . SHOULDER ARTHROSCOPY WITH ROTATOR CUFF REPAIR Right 10/17/2018   Procedure: Right shoulder arthroscopy with subacromial decompression, distal clavicle resection,  rotator cuff DEBRIEDMENT, LABRIAL DEBRIDMENT;  Surgeon: Justice Britain, MD;  Location: [facility];  Service: Orthopedics;  Laterality: Right;  18min    There were no vitals filed for this visit.  Subjective Assessment - 12/21/18 1128    Subjective   S:  I am still having the feeling of intability in my  shoulder, particularly when I lay on my left side.  It is also very sore to the touch (ac joint region)    Currently in Pain?  Yes    Pain Score  3     Pain Location  Shoulder    Pain Orientation  Right    Pain Descriptors / Indicators  Aching;Sore    Pain Type  Acute pain         [facility] OT Assessment - 12/21/18 0001      Assessment   Medical Diagnosis  Right shoulder SA, SAD, DCR      Precautions   Precautions  Shoulder    Type of Shoulder Precautions  Progress as tolerated               OT Treatments/Exercises (OP) - 12/21/18 0001      Exercises   Exercises  Shoulder      Shoulder Exercises: Supine   Protraction  PROM;5 reps;Strengthening;10 reps    Protraction Weight (lbs)  2    Protraction Limitations  cuing to squeeze shoulder blades during retraction     Horizontal ABduction  PROM;5 reps;Strengthening;10 reps    Horizontal ABduction Weight (lbs)  2    Horizontal ABduction Limitations  verbal cuing to squeeze shoulder blades  during abduction     External Rotation  PROM;5 reps;Strengthening;10 reps    External Rotation Weight (lbs)  2    Internal Rotation  PROM;5 reps;Strengthening;10 reps    Internal Rotation Weight (lbs)  2    Flexion  PROM;5 reps;Strengthening;10 reps    Shoulder Flexion Weight (lbs)  2    ABduction  PROM;5 reps;Strengthening;10 reps    Shoulder ABduction Weight (lbs)  2      Shoulder Exercises: Standing   Other Standing Exercises  Serratus anterior punch; green theraband; 12X    Other Standing Exercises  standing holding green therapy ball completed chest press, overhead press, overhead flexion 15 times with minimal fatigue       Shoulder Exercises: ROM/Strengthening   UBE (Upper Arm Bike)  Level 3 3' forward 3' reverse at 5.5 speed       Manual Therapy   Manual Therapy  Myofascial release    Manual therapy comments  Completed separate from exercises.     Myofascial Release  Myofascial release and manual stretching to right upper  arm, trapezius, and scapularis region to decrease fascial restrictions and increased joint mobility in a pain free zone.  focused manual therapy on ac joint region, as well as upper trapezius and scm region this date.  added manual traction to cervical region this date.              OT Education - 12/21/18 1129    Education Details  educated patient on serratus anterior punch both lying in supine with hand weigth and standing with theraband.  Encouraged patient to use heat for pain relief and tightness prior to completing exercises and throughout day.    Person(s) Educated  Patient    Methods  Explanation;Demonstration;Handout    Comprehension  Verbalized understanding;Returned demonstration       OT Short Term Goals - 11/28/18 1127      OT SHORT TERM GOAL #1   Title  Patient will be educated and independent with HEP to increase functional use of his RUE be able to return to utilizing it as his dominant extremity for all daily tasks.    Time  6    Period  Weeks    Status  On-going    Target Date  12/06/18      OT SHORT TERM GOAL #2   Title  Patient will increase RUE P/ROM to [facility] in order to increase ability to bath himself and wash his face without assistance.    Time  6    Period  Weeks      OT SHORT TERM GOAL #3   Title  Patient will increase RUE strength to 3/5 in order to complete waist level activities without difficulty.    Time  6    Period  Weeks      OT SHORT TERM GOAL #4   Title  Patient will decrease pain level to 6/10 or less during functional use and low level reaching tasks.    Time  6    Period  Weeks      OT SHORT TERM GOAL #5   Title  Patient will decrease fascial restrictions to mod amount or less in order to increase functional mobility needed in the RUE to complete low level reaching.    Time  6    Period  Weeks        OT Long Term Goals - 10/31/18 1203      OT LONG TERM GOAL #1  Title  Patient will return to highest level of independence  using his RUE for all work and daily tasks while progressing as tolerated.    Time  12    Period  Weeks    Status  On-going      OT LONG TERM GOAL #2   Title  Patient will increase A/ROM of his RUE to [facility] in order to complete reaching tasks above his head with no difficulty.    Time  12    Period  Weeks    Status  On-going      OT LONG TERM GOAL #3   Title  Patient will increase his RUE strength to 4+/5 in order to return to work and begin his usual job requirements without restrictions.    Time  12    Period  Weeks    Status  On-going      OT LONG TERM GOAL #4   Title  Patient will decrease his fascial restrictions to min amount or less in order to increase functional mobility needed to complete reaching tasks.    Time  12    Period  Weeks    Status  On-going      OT LONG TERM GOAL #5   Title  patient will decrease pain level in RUE to 3/10 or less during functional daily tasks.    Time  12    Period  Weeks    Status  On-going            Plan - 12/21/18 1131    Clinical Impression Statement  A:  Patient with increased pain and palpable tightness both in [facility] joint region, as well as upper traps/ SCM this date.  Patient had moderate relief from pain with manual therapy intervention.  cued to focus on scapular retraction during all therapeutic exercises, increased to 2# resistance with supine strengthening exercises.    Body Structure / Function / Physical Skills  ADL;ROM;Scar mobility;IADL;Fascial restriction;Flexibility;Strength;Pain;UE functional use;GMC    Plan  P:  continue manual therapy to ac joint region and upper trap, scm region.  attempt laser for ac joint pain.  add prone ball exercises for improved scapular stability.       Patient will benefit from skilled therapeutic intervention in order to improve the following deficits and impairments:   Body Structure / Function / Physical Skills: ADL, ROM, Scar mobility, IADL, Fascial restriction, Flexibility, Strength,  Pain, UE functional use, GMC       Visit Diagnosis: Other symptoms and signs involving the musculoskeletal system  Acute pain of right shoulder  Stiffness of right shoulder, not elsewhere classified    Problem List Patient Active Problem List   Diagnosis Date Noted  . Newly diagnosed diabetes ([place]) 01/13/2017  . Type 2 diabetes mellitus without complications ([place]) 123XX123  . Prediabetes 05/24/2016  . Patellar tendinitis of left knee 12/11/2013  . Morbid obesity ([place]) 05/09/2013  . Osteoarthritis of left knee 11/09/2012  . Obstructive sleep apnea 09/11/2012  . HTN (hypertension) 09/11/2012  . Impaired fasting glucose 09/11/2012  . Degenerative arthritis of left knee 06/03/2011  . S/P lateral meniscectomy of left knee 06/03/2011  . BACK PAIN 05/01/2009  . DERANGEMENT MENISCUS 09/04/2007  . ELBOW PAIN, BILATERAL 04/06/2007  . DEGENERATIVE JOINT DISEASE, KNEE 03/13/2007  . Pain in joint, lower leg 03/13/2007    Vangie Bicker, [place], OTR/L [phone]  12/21/2018, 1:26 PM  [place], [place], [address]  Phone: [phone]   Fax:  [phone]  Name: Adam Hahn MRN: [id number] Date of Birth: 1966-04-25

## 2018-12-26 ENCOUNTER — Ambulatory Visit (HOSPITAL_COMMUNITY): Payer: No Typology Code available for payment source

## 2018-12-26 ENCOUNTER — Other Ambulatory Visit: Payer: Self-pay

## 2018-12-26 ENCOUNTER — Encounter (HOSPITAL_COMMUNITY): Payer: Self-pay

## 2018-12-26 DIAGNOSIS — M25611 Stiffness of right shoulder, not elsewhere classified: Secondary | ICD-10-CM

## 2018-12-26 DIAGNOSIS — M25511 Pain in right shoulder: Secondary | ICD-10-CM

## 2018-12-26 DIAGNOSIS — R29898 Other symptoms and signs involving the musculoskeletal system: Secondary | ICD-10-CM | POA: Diagnosis not present

## 2018-12-26 NOTE — Therapy (Signed)
[place], [place], [address] Phone: [phone]   Fax:  [phone]  Occupational Therapy Treatment  Patient Details  Name: Adam Hahn MRN: [id number] Date of Birth: 1965-06-08 Referring Provider (OT): Dr. Justice Britain   Encounter Date: 12/26/2018  OT End of Session - 12/26/18 1111    Visit Number  12    Number of Visits  20    Date for OT Re-Evaluation  01/30/19   mini reassess on 01/02/19   Authorization Type  Patient is not filing with [employer] ($30 co pay- 60 visit limit). He is only filing with [facility].    Authorization Time Period  [facility] approved 15 visits. Request additional visits when needed.    Authorization - Visit Number  12    Authorization - Number of Visits  15    OT Start Time  [id number]    OT Stop Time  1115    OT Time Calculation (min)  42 min    Activity Tolerance  Patient tolerated treatment well    Behavior During Therapy  WFL for tasks assessed/performed       Past Medical History:  Diagnosis Date  . Allergic rhinitis   . Arthritis    knees,  right shoulder ac joint  . History of exercise stress test 10-09-2001 in epic   normal  . HTN (hypertension)   . OSA on CPAP   . Shoulder impingement, right   . Type 2 diabetes mellitus ([place])    followed by pcp  . Wears glasses     Past Surgical History:  Procedure Laterality Date  . KNEE SURGERY Left x4  last one 1996  . LAPAROSCOPIC CHOLECYSTECTOMY  09-20-2007  @[facility]   . SHOULDER ARTHROSCOPY WITH ROTATOR CUFF REPAIR Right 10/17/2018   Procedure: Right shoulder arthroscopy with subacromial decompression, distal clavicle resection,  rotator cuff DEBRIEDMENT, LABRIAL DEBRIDMENT;  Surgeon: Justice Britain, MD;  Location: [facility];  Service: Orthopedics;  Laterality: Right;  178min    There were no vitals filed for this visit.  Subjective Assessment - 12/26/18 1051    Subjective   S: It's still bothering me. I woke up at 3AM with it  hurting and I was doing the exercises in the bed.    Currently in Pain?  Yes    Pain Score  3     Pain Location  Shoulder    Pain Orientation  Right    Pain Descriptors / Indicators  Aching;Sore    Pain Type  Acute pain    Pain Radiating Towards  wraps around the shoulder joint    Pain Onset  In the past 7 days    Pain Frequency  Constant    Aggravating Factors   mvement and use. new exercises    Pain Relieving Factors  ice machine and pain medication    Effect of Pain on Daily Activities  no effect on general movement and use    Multiple Pain Sites  No         [facility] OT Assessment - 12/26/18 1053      Assessment   Medical Diagnosis  Right shoulder SA, SAD, DCR      Precautions   Precautions  Shoulder    Type of Shoulder Precautions  Progress as tolerated               OT Treatments/Exercises (OP) - 12/26/18 1053      Exercises   Exercises  Shoulder  Shoulder Exercises: Supine   Protraction  PROM;5 reps;AROM;10 reps    Horizontal ABduction  PROM;5 reps;AROM;10 reps    External Rotation  PROM;5 reps    Internal Rotation  PROM;5 reps    Flexion  PROM;5 reps;AROM;10 reps    ABduction  PROM;5 reps;AROM;10 reps      Modalities   Modalities  Electrical Stimulation;Moist Heat      Moist Heat Therapy   Number Minutes Moist Heat  10 Minutes    Moist Heat Location  Shoulder      Electrical Stimulation   Electrical Stimulation Location  right shoulder    Electrical Stimulation Action  Interferential    Electrical Stimulation Parameters  12.5CV    Electrical Stimulation Goals  Pain      Manual Therapy   Manual Therapy  Myofascial release    Manual therapy comments  Completed separate from exercises.     Myofascial Release  Myofascial release and manual stretching to right upper arm, trapezius, and scapularis region to decrease fascial restrictions and increased joint mobility in a pain free zone.      Used ball to assist            OT Education -  12/26/18 1121    Education Details  Educated patient on use of ball to complete self myofascial release/trigger point release. Try using heat versus cold therapy at home to help relax muscles. Use home TENS unit as needed for pain management.    Person(s) Educated  Patient    Methods  Explanation    Comprehension  Verbalized understanding       OT Short Term Goals - 11/28/18 1127      OT SHORT TERM GOAL #1   Title  Patient will be educated and independent with HEP to increase functional use of his RUE be able to return to utilizing it as his dominant extremity for all daily tasks.    Time  6    Period  Weeks    Status  On-going    Target Date  12/06/18      OT SHORT TERM GOAL #2   Title  Patient will increase RUE P/ROM to [facility] in order to increase ability to bath himself and wash his face without assistance.    Time  6    Period  Weeks      OT SHORT TERM GOAL #3   Title  Patient will increase RUE strength to 3/5 in order to complete waist level activities without difficulty.    Time  6    Period  Weeks      OT SHORT TERM GOAL #4   Title  Patient will decrease pain level to 6/10 or less during functional use and low level reaching tasks.    Time  6    Period  Weeks      OT SHORT TERM GOAL #5   Title  Patient will decrease fascial restrictions to mod amount or less in order to increase functional mobility needed in the RUE to complete low level reaching.    Time  6    Period  Weeks        OT Long Term Goals - 10/31/18 1203      OT LONG TERM GOAL #1   Title  Patient will return to highest level of independence using his RUE for all work and daily tasks while progressing as tolerated.    Time  12    Period  Weeks  Status  On-going      OT LONG TERM GOAL #2   Title  Patient will increase A/ROM of his RUE to [facility] in order to complete reaching tasks above his head with no difficulty.    Time  12    Period  Weeks    Status  On-going      OT LONG TERM GOAL #3   Title   Patient will increase his RUE strength to 4+/5 in order to return to work and begin his usual job requirements without restrictions.    Time  12    Period  Weeks    Status  On-going      OT LONG TERM GOAL #4   Title  Patient will decrease his fascial restrictions to min amount or less in order to increase functional mobility needed to complete reaching tasks.    Time  12    Period  Weeks    Status  On-going      OT LONG TERM GOAL #5   Title  patient will decrease pain level in RUE to 3/10 or less during functional daily tasks.    Time  12    Period  Weeks    Status  On-going            Plan - 12/26/18 1125    Clinical Impression Statement  A: Session focused on pain management and use of manual techniques to decrease trigger points and fascial restrictions in the right anterior shoulder region. Use of ES and heat was utilized to help decreased muscle pain and tightness. Patient was educated on use of at home.    Body Structure / Function / Physical Skills  ADL;ROM;Scar mobility;IADL;Fascial restriction;Flexibility;Strength;Pain;UE functional use;GMC    Plan  P: Follow up on pain and pain management techniques completed at home. Continue with shoulder and scapular stability exercises.    Consulted and Agree with Plan of Care  Patient       Patient will benefit from skilled therapeutic intervention in order to improve the following deficits and impairments:   Body Structure / Function / Physical Skills: ADL, ROM, Scar mobility, IADL, Fascial restriction, Flexibility, Strength, Pain, UE functional use, GMC       Visit Diagnosis: Other symptoms and signs involving the musculoskeletal system  Stiffness of right shoulder, not elsewhere classified  Acute pain of right shoulder    Problem List Patient Active Problem List   Diagnosis Date Noted  . Newly diagnosed diabetes ([place]) 01/13/2017  . Type 2 diabetes mellitus without complications ([place]) 123XX123  . Prediabetes  05/24/2016  . Patellar tendinitis of left knee 12/11/2013  . Morbid obesity ([place]) 05/09/2013  . Osteoarthritis of left knee 11/09/2012  . Obstructive sleep apnea 09/11/2012  . HTN (hypertension) 09/11/2012  . Impaired fasting glucose 09/11/2012  . Degenerative arthritis of left knee 06/03/2011  . S/P lateral meniscectomy of left knee 06/03/2011  . BACK PAIN 05/01/2009  . DERANGEMENT MENISCUS 09/04/2007  . ELBOW PAIN, BILATERAL 04/06/2007  . DEGENERATIVE JOINT DISEASE, KNEE 03/13/2007  . Pain in joint, lower leg 03/13/2007    Ailene Ravel, OTR/L,CBIS  [phone]  12/26/2018, 11:28 AM  [place], [place], [address] Phone: [phone]   Fax:  [phone]  Name: Adam Hahn MRN: [id number] Date of Birth: [birth year]

## 2018-12-28 ENCOUNTER — Encounter (HOSPITAL_COMMUNITY): Payer: Self-pay

## 2018-12-28 ENCOUNTER — Ambulatory Visit (HOSPITAL_COMMUNITY): Payer: No Typology Code available for payment source

## 2018-12-28 ENCOUNTER — Other Ambulatory Visit: Payer: Self-pay

## 2018-12-28 DIAGNOSIS — M25511 Pain in right shoulder: Secondary | ICD-10-CM

## 2018-12-28 DIAGNOSIS — R29898 Other symptoms and signs involving the musculoskeletal system: Secondary | ICD-10-CM | POA: Diagnosis not present

## 2018-12-28 DIAGNOSIS — M25611 Stiffness of right shoulder, not elsewhere classified: Secondary | ICD-10-CM

## 2018-12-28 NOTE — Therapy (Signed)
[place], [place], [address] Phone: [phone]   Fax:  [phone]  Occupational Therapy Treatment reassessment Patient Details  Name: Adam Hahn MRN: [id number] Date of Birth: December 05, 1965 Referring Provider (OT): Dr. Justice Britain   Encounter Date: 12/28/2018  OT End of Session - 12/28/18 1115    Visit Number  13    Number of Visits  20    Date for OT Re-Evaluation  01/30/19    Authorization Type  Patient is not filing with BC[employer] ($30 co pay- 60 visit limit). He is only filing with [facility].    Authorization Time Period  [facility] approved 15 visits. Request additional visits when needed. 8/27: Submitting for 8 more visits approval from [place].    Authorization - Visit Number  13    Authorization - Number of Visits  15    OT Start Time  1035   reassessment   OT Stop Time  1115    OT Time Calculation (min)  40 min    Activity Tolerance  Patient tolerated treatment well    Behavior During Therapy  WFL for tasks assessed/performed       Past Medical History:  Diagnosis Date  . Allergic rhinitis   . Arthritis    knees,  right shoulder ac joint  . History of exercise stress test 10-09-2001 in epic   normal  . HTN (hypertension)   . OSA on CPAP   . Shoulder impingement, right   . Type 2 diabetes mellitus ([place])    followed by pcp  . Wears glasses     Past Surgical History:  Procedure Laterality Date  . KNEE SURGERY Left x4  last one 1996  . LAPAROSCOPIC CHOLECYSTECTOMY  09-20-2007  '@[facility]'   . SHOULDER ARTHROSCOPY WITH ROTATOR CUFF REPAIR Right 10/17/2018   Procedure: Right shoulder arthroscopy with subacromial decompression, distal clavicle resection,  rotator cuff DEBRIEDMENT, LABRIAL DEBRIDMENT;  Surgeon: Justice Britain, MD;  Location: [facility];  Service: Orthopedics;  Laterality: Right;  128mn    There were no vitals filed for this visit.  Subjective Assessment - 12/28/18 1045    Subjective   S:  I'm not able to do what I need to do for work yet.    Currently in Pain?  Yes    Pain Score  2     Pain Location  Shoulder    Pain Orientation  Right    Pain Descriptors / Indicators  Aching;Sore    Pain Type  Acute pain         [facility] OT Assessment - 12/28/18 1046      Assessment   Medical Diagnosis  Right shoulder SA, SAD, DCR    Referring Provider (OT)  Dr. KJustice Britain     Precautions   Precautions  Shoulder    Type of Shoulder Precautions  Progress as tolerated      Prior Function   Level of Independence  Independent      Observation/Other Assessments   Focus on Therapeutic Outcomes (FOTO)   68/100      ROM / Strength   AROM / PROM / Strength  AROM;PROM;Strength      Palpation   Palpation comment  min fascial restrictions in right scapular and upper trapezius region      AROM   Overall AROM Comments  Assessed standing. IR/er abducted and was measured with shoulder adducted previously.    AROM Assessment Site  Shoulder  Right/Left Shoulder  Right    Right Shoulder Flexion  160 Degrees   previous: 155   Right Shoulder ABduction  160 Degrees   previous: 150   Right Shoulder Internal Rotation  60 Degrees   previous: 90 adducted   Right Shoulder External Rotation  70 Degrees   previous: 65 adducted     PROM   Overall PROM Comments  Assessed supine. er abducted.     PROM Assessment Site  Shoulder    Right/Left Shoulder  Right    Right Shoulder Flexion  180 Degrees   previous: same   Right Shoulder ABduction  180 Degrees   previous: same   Right Shoulder Internal Rotation  90 Degrees   previous: same   Right Shoulder External Rotation  65 Degrees   previous: 60     Strength   Overall Strength Comments  Assessed standing. IR/er adducted.     Strength Assessment Site  Shoulder    Right/Left Shoulder  Right    Right Shoulder Flexion  5/5   previous: 4/5   Right Shoulder ABduction  5/5   previous: 4/5   Right Shoulder Internal Rotation  5/5   previous:  4/5   Right Shoulder External Rotation  5/5   previous: 4/5              OT Treatments/Exercises (OP) - 12/28/18 1046      Exercises   Exercises  Shoulder      Shoulder Exercises: Supine   Protraction  PROM;5 reps    Horizontal ABduction  PROM;5 reps    External Rotation  PROM;5 reps    Internal Rotation  PROM;5 reps    Flexion  PROM;5 reps    ABduction  PROM;5 reps      Shoulder Exercises: Standing   Horizontal ABduction  Strengthening;15 reps    Horizontal ABduction Weight (lbs)  2    External Rotation  Strengthening;15 reps   abducted   Internal Rotation  Strengthening;15 reps   abducted   Internal Rotation Weight (lbs)  2    Flexion  Strengthening;15 reps    Shoulder Flexion Weight (lbs)  2    ABduction  Strengthening;15 reps    Shoulder ABduction Weight (lbs)  2      Shoulder Exercises: Therapy Ball   Other Therapy Ball Exercises  Green therapy ball: diagonals, circles, chest press, flexion 15X      Shoulder Exercises: ROM/Strengthening   UBE (Upper Arm Bike)  Level 5 3' forward 3' flexion       Manual Therapy   Manual Therapy  Myofascial release    Manual therapy comments  Completed separate from exercises.     Myofascial Release  Myofascial release completed to right scapularis region to decrease fascial restrictions and increase joint mobility.              OT Education - 12/28/18 1114    Education Details  Reviewed goals and progress in therapy. Discussed continuing therapy for 4 more weeks to focus on sustained shoulder strength and endurance.    Person(s) Educated  Patient    Methods  Explanation    Comprehension  Verbalized understanding       OT Short Term Goals - 12/28/18 1054      OT SHORT TERM GOAL #1   Title  Patient will be educated and independent with HEP to increase functional use of his RUE be able to return to utilizing it as his dominant extremity for all daily  tasks.    Time  6    Period  Weeks    Status  Achieved     Target Date  12/06/18      OT SHORT TERM GOAL #2   Title  Patient will increase RUE P/ROM to [facility] in order to increase ability to bath himself and wash his face without assistance.    Time  6    Period  Weeks      OT SHORT TERM GOAL #3   Title  Patient will increase RUE strength to 3/5 in order to complete waist level activities without difficulty.    Time  6    Period  Weeks      OT SHORT TERM GOAL #4   Title  Patient will decrease pain level to 6/10 or less during functional use and low level reaching tasks.    Time  6    Period  Weeks      OT SHORT TERM GOAL #5   Title  Patient will decrease fascial restrictions to mod amount or less in order to increase functional mobility needed in the RUE to complete low level reaching.    Time  6    Period  Weeks        OT Long Term Goals - 12/28/18 1054      OT LONG TERM GOAL #1   Title  Patient will return to highest level of independence using his RUE for all work and daily tasks while progressing as tolerated.    Time  12    Period  Weeks    Status  On-going      OT LONG TERM GOAL #2   Title  Patient will increase A/ROM of his RUE to [facility] in order to complete reaching tasks above his head with no difficulty.    Time  12    Period  Weeks    Status  Achieved      OT LONG TERM GOAL #3   Title  Patient will increase his RUE strength to 4+/5 in order to return to work and begin his usual job requirements without restrictions.    Time  12    Period  Weeks    Status  On-going      OT LONG TERM GOAL #4   Title  Patient will decrease his fascial restrictions to min amount or less in order to increase functional mobility needed to complete reaching tasks.    Time  12    Period  Weeks    Status  Achieved      OT LONG TERM GOAL #5   Title  patient will decrease pain level in RUE to 3/10 or less during functional daily tasks.    Time  12    Period  Weeks    Status  On-going            Plan - 12/28/18 1116    Clinical  Impression Statement  A: Reassessment completed this date as we are close to the 15 visit approval from [place]. Patient has met all short term goals and 2 long term goals. He has good shoulder strength below his shoulder level. Once he is challenged above his shoulder level he experiences muscle fatigue and weakness. He is unable to demonstrate sustained shoulder strength needed to complete required work tasks. Patient reports he does a lot of heavy lifting, pushing, and pulling.when at work while having to move up to 290#. pt will continue to benefit from  skilled OT services to focus on functional strength and shoulder endurance in order to allow him to return to work and complete his required tasks with the least amount of restrictions.    Body Structure / Function / Physical Skills  ADL;ROM;Scar mobility;IADL;Fascial restriction;Flexibility;Strength;Pain;UE functional use;GMC    OT Frequency  2x / week    OT Duration  4 weeks    Plan  P:Use weighted sled and begin to progress weight while imitating work tasks of moving tires.    Consulted and Agree with Plan of Care  Patient       Patient will benefit from skilled therapeutic intervention in order to improve the following deficits and impairments:   Body Structure / Function / Physical Skills: ADL, ROM, Scar mobility, IADL, Fascial restriction, Flexibility, Strength, Pain, UE functional use, GMC       Visit Diagnosis: Stiffness of right shoulder, not elsewhere classified  Other symptoms and signs involving the musculoskeletal system  Acute pain of right shoulder    Problem List Patient Active Problem List   Diagnosis Date Noted  . Newly diagnosed diabetes ([place]) 01/13/2017  . Type 2 diabetes mellitus without complications ([place]) 37/36/6815  . Prediabetes 05/24/2016  . Patellar tendinitis of left knee 12/11/2013  . Morbid obesity ([place]) 05/09/2013  . Osteoarthritis of left knee 11/09/2012  . Obstructive sleep apnea 09/11/2012  . HTN  (hypertension) 09/11/2012  . Impaired fasting glucose 09/11/2012  . Degenerative arthritis of left knee 06/03/2011  . S/P lateral meniscectomy of left knee 06/03/2011  . BACK PAIN 05/01/2009  . DERANGEMENT MENISCUS 09/04/2007  . ELBOW PAIN, BILATERAL 04/06/2007  . DEGENERATIVE JOINT DISEASE, KNEE 03/13/2007  . Pain in joint, lower leg 03/13/2007   Ailene Ravel, OTR/L,CBIS  [phone]  12/28/2018, 12:55 PM  [facility] [address] [place], [place], [address] Phone: [phone]   Fax:  [phone]  Name: Adam Hahn MRN: [id number] Date of Birth: [birth year]

## 2018-12-29 ENCOUNTER — Telehealth (HOSPITAL_COMMUNITY): Payer: Self-pay

## 2018-12-29 DIAGNOSIS — M9901 Segmental and somatic dysfunction of cervical region: Secondary | ICD-10-CM | POA: Diagnosis not present

## 2018-12-29 DIAGNOSIS — M542 Cervicalgia: Secondary | ICD-10-CM | POA: Diagnosis not present

## 2018-12-29 DIAGNOSIS — M9903 Segmental and somatic dysfunction of lumbar region: Secondary | ICD-10-CM | POA: Diagnosis not present

## 2018-12-29 DIAGNOSIS — M545 Low back pain: Secondary | ICD-10-CM | POA: Diagnosis not present

## 2018-12-29 NOTE — Telephone Encounter (Signed)
Fax request for more [place] visits to be approved. NF 12/29/2018 14th visit on 01/02/2019 per [place] approved 15 visits for OT  DOS: 10/25/2018 --Scanned 10/20/2018 NF

## 2018-12-29 NOTE — Telephone Encounter (Signed)
Called and confirmed the fax was rec'd. S/w [place] Sous[place] she states the nurse Paulene Floor  is out today and she will review it on Monday. NF 12/29/2018

## 2019-01-01 ENCOUNTER — Telehealth (HOSPITAL_COMMUNITY): Payer: Self-pay

## 2019-01-01 NOTE — Telephone Encounter (Signed)
01/04/2019 15th visit will end first pre-cert. S/w Melissa at [facility] she will forward req to [place] to approve more visits. Wallie Renshaw should be faxing over the approval once app'd with how many more he can have. NF 01/01/2019.

## 2019-01-02 ENCOUNTER — Other Ambulatory Visit: Payer: Self-pay

## 2019-01-02 ENCOUNTER — Ambulatory Visit (HOSPITAL_COMMUNITY): Payer: No Typology Code available for payment source | Attending: Orthopedic Surgery

## 2019-01-02 ENCOUNTER — Encounter (HOSPITAL_COMMUNITY): Payer: Self-pay

## 2019-01-02 DIAGNOSIS — M25611 Stiffness of right shoulder, not elsewhere classified: Secondary | ICD-10-CM

## 2019-01-02 DIAGNOSIS — M25511 Pain in right shoulder: Secondary | ICD-10-CM

## 2019-01-02 DIAGNOSIS — R29898 Other symptoms and signs involving the musculoskeletal system: Secondary | ICD-10-CM | POA: Insufficient documentation

## 2019-01-02 NOTE — Therapy (Signed)
[place], [place], [address] Phone: [phone]   Fax:  [phone]  Occupational Therapy Treatment  Patient Details  Name: Adam Hahn MRN: [id number] Date of Birth: 08-Dec-1965 Referring Provider (OT): Dr. Justice Britain   Encounter Date: 01/02/2019  OT End of Session - 01/02/19 1226    Visit Number  14    Number of Visits  20    Date for OT Re-Evaluation  01/30/19    Authorization Type  Patient is not filing with [employer] ($30 co pay- 60 visit limit). He is only filing with [facility].    Authorization Time Period  [facility] approved 15 visits. Request additional visits when needed. 8/27: Submitting for 8 more visits approval from [place].    Authorization - Visit Number  14    Authorization - Number of Visits  15    OT Start Time  [id number]    OT Stop Time  1113    OT Time Calculation (min)  42 min    Activity Tolerance  Patient tolerated treatment well    Behavior During Therapy  WFL for tasks assessed/performed       Past Medical History:  Diagnosis Date  . Allergic rhinitis   . Arthritis    knees,  right shoulder ac joint  . History of exercise stress test 10-09-2001 in epic   normal  . HTN (hypertension)   . OSA on CPAP   . Shoulder impingement, right   . Type 2 diabetes mellitus ([place])    followed by pcp  . Wears glasses     Past Surgical History:  Procedure Laterality Date  . KNEE SURGERY Left x4  last one 1996  . LAPAROSCOPIC [facility]LECYSTECTOMY  09-20-2007  @[facility]   . SHOULDER ARTHROSCOPY WITH ROTATOR CUFF REPAIR Right 10/17/2018   Procedure: Right shoulder arthroscopy with subacromial decompression, distal clavicle resection,  rotator cuff DEBRIEDMENT, LABRIAL DEBRIDMENT;  Surgeon: Justice Britain, MD;  Location: [facility];  Service: Orthopedics;  Laterality: Right;  161min    There were no vitals filed for this visit.  Subjective Assessment - 01/02/19 1044    Subjective   S: I have to have a pillow  under it when I'm in bed otherwise it feels like it's popping and unstable.    Currently in Pain?  No/denies         [facility] OT Assessment - 01/02/19 1045      Assessment   Medical Diagnosis  Right shoulder SA, SAD, DCR      Precautions   Precautions  Shoulder    Type of Shoulder Precautions  Progress as tolerated               OT Treatments/Exercises (OP) - 01/02/19 1046      Exercises   Exercises  Shoulder;Work Actuary Exercises: Supine   Protraction  PROM;5 reps    Horizontal ABduction  PROM;5 reps    External Rotation  PROM;5 reps    Internal Rotation  PROM;5 reps    Flexion  PROM;5 reps    ABduction  PROM;5 reps      Shoulder Exercises: Standing   Horizontal ABduction  Strengthening;15 reps    Horizontal ABduction Weight (lbs)  2    External Rotation  Strengthening;15 reps   abducted   External Rotation Weight (lbs)  2    Internal Rotation  Strengthening;15 reps   abducted   Internal Rotation Weight (lbs)  2  Flexion  Strengthening;15 reps    Shoulder Flexion Weight (lbs)  2    ABduction  Strengthening;15 reps    Shoulder ABduction Weight (lbs)  2      Shoulder Exercises: ROM/Strengthening   UBE (Upper Arm Bike)  Level 6 3' forward 3' flexion     Cybex Press  --   4 plate; 12X; bodycraft   X to V Arms  2# 15X    Ball on Wall  1' flexion 1' abduction red ball    Other ROM/Strengthening Exercises  Y arm lift wall lift off; 12X    Other ROM/Strengthening Exercises  Bodycraft, retraction, 12X, 4 plate      Work Sports administrator  Patient completed sled push with 184#. Completed U shape around clinic starting at ADL kitchen around to Nustep machine and back.                OT Short Term Goals - 01/02/19 1229      OT SHORT TERM GOAL #1   Title  Patient will be educated and independent with HEP to increase functional use of his RUE be able to return to utilizing it as his dominant extremity for all daily  tasks.    Time  6    Period  Weeks    Target Date  12/06/18      OT SHORT TERM GOAL #2   Title  Patient will increase RUE P/ROM to [facility] in order to increase ability to bath himself and wash his face without assistance.    Time  6    Period  Weeks      OT SHORT TERM GOAL #3   Title  Patient will increase RUE strength to 3/5 in order to complete waist level activities without difficulty.    Time  6    Period  Weeks      OT SHORT TERM GOAL #4   Title  Patient will decrease pain level to 6/10 or less during functional use and low level reaching tasks.    Time  6    Period  Weeks      OT SHORT TERM GOAL #5   Title  Patient will decrease fascial restrictions to mod amount or less in order to increase functional mobility needed in the RUE to complete low level reaching.    Time  6    Period  Weeks        OT Long Term Goals - 01/02/19 1229      OT LONG TERM GOAL #1   Title  Patient will return to highest level of independence using his RUE for all work and daily tasks while progressing as tolerated.    Time  12    Period  Weeks    Status  On-going      OT LONG TERM GOAL #2   Title  Patient will increase A/ROM of his RUE to [facility] in order to complete reaching tasks above his head with no difficulty.    Time  12    Period  Weeks      OT LONG TERM GOAL #3   Title  Patient will increase his RUE strength to 4+/5 in order to return to work and begin his usual job requirements without restrictions.    Time  12    Period  Weeks    Status  On-going      OT LONG TERM GOAL #4   Title  Patient will decrease  his fascial restrictions to min amount or less in order to increase functional mobility needed to complete reaching tasks.    Time  12    Period  Weeks      OT LONG TERM GOAL #5   Title  patient will decrease pain level in RUE to 3/10 or less during functional daily tasks.    Time  12    Period  Weeks    Status  On-going            Plan - 01/02/19 1226    Clinical  Impression Statement  A: Focused on shoulder and scapular strenghtening while incorporating work related tasks and movements. 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 Completed a short distance during weighted sled push with moderate difficulty and fatigue noted.    Body Structure / Function / Physical Skills  ADL;ROM;Scar mobility;IADL;Fascial restriction;Flexibility;Strength;Pain;UE functional use;GMC    Plan  P: Continue with weighted sled push increasing distance as able before increasing weight. Determine the distance he needs to go at work when moving tires.    Consulted and Agree with Plan of Care  Patient       Patient will benefit from skilled therapeutic intervention in order to improve the following deficits and impairments:   Body Structure / Function / Physical Skills: ADL, ROM, Scar mobility, IADL, Fascial restriction, Flexibility, Strength, Pain, UE functional use, GMC       Visit Diagnosis: Other symptoms and signs involving the musculoskeletal system  Acute pain of right shoulder  Stiffness of right shoulder, not elsewhere classified    Problem List Patient Active Problem List   Diagnosis Date Noted  . Newly diagnosed diabetes ([place]) 01/13/2017  . Type 2 diabetes mellitus without complications ([place]) [id number]  . Prediabetes 05/24/2016  . Patellar tendinitis of left knee 12/11/2013  . Morbid obesity ([place]) 05/09/2013  . Osteoarthritis of left knee 11/09/2012  . Obstructive sleep apnea 09/11/2012  . HTN (hypertension) 09/11/2012  . Impaired fasting glucose 09/11/2012  . Degenerative arthritis of left knee 06/03/2011  . S/P lateral meniscectomy of left knee 06/03/2011  . BACK PAIN 05/01/2009  . DERANGEMENT MENISCUS 09/04/2007  . ELBOW PAIN, BILATERAL 04/06/2007  . DEGENERATIVE JOINT DISEASE, KNEE 03/13/2007  .  Pain in joint, lower leg 03/13/2007   Ailene Ravel, OTR/L,CBIS  [phone]  01/02/2019, 12:31 PM  [facility] [address] [place], [place], [address] Phone: [phone]   Fax:  [phone]  Name: Adam Hahn MRN: [id number] Date of Birth: 05/23/1965

## 2019-01-04 ENCOUNTER — Ambulatory Visit (HOSPITAL_COMMUNITY): Payer: No Typology Code available for payment source

## 2019-01-04 ENCOUNTER — Telehealth (HOSPITAL_COMMUNITY): Payer: Self-pay

## 2019-01-04 ENCOUNTER — Encounter (HOSPITAL_COMMUNITY): Payer: Self-pay

## 2019-01-04 ENCOUNTER — Other Ambulatory Visit: Payer: Self-pay

## 2019-01-04 DIAGNOSIS — M25511 Pain in right shoulder: Secondary | ICD-10-CM

## 2019-01-04 DIAGNOSIS — M25611 Stiffness of right shoulder, not elsewhere classified: Secondary | ICD-10-CM

## 2019-01-04 DIAGNOSIS — R29898 Other symptoms and signs involving the musculoskeletal system: Secondary | ICD-10-CM

## 2019-01-04 NOTE — Therapy (Signed)
[place], [place], [address] Phone: [phone]   Fax:  [phone]  Occupational Therapy Treatment  Patient Details  Name: Adam Hahn MRN: [id number] Date of Birth: [birth year] Referring Provider (OT): Dr. Justice Britain   Encounter Date: 01/04/2019  OT End of Session - 01/04/19 0956    Visit Number  15    Number of Visits  20    Date for OT Re-Evaluation  01/30/19    Authorization Type  Patient is not filing with [employer] ($30 co pay- 60 visit limit). He is only filing with [facility].    Authorization Time Period  [facility] approved 15 visits. Request additional visits when needed. 8/27: Submitting for 8 more visits approval from [place].    Authorization - Visit Number  15    Authorization - Number of Visits  15    OT Start Time  [phone]    OT Stop Time  [phone]    OT Time Calculation (min)  35 min    Activity Tolerance  Patient tolerated treatment well    Behavior During Therapy  [facility] for tasks assessed/performed       Past Medical History:  Diagnosis Date  . Allergic rhinitis   . Arthritis    knees,  right shoulder ac joint  . History of exercise stress test 10-09-2001 in epic   normal  . HTN (hypertension)   . OSA on CPAP   . Shoulder impingement, right   . Type 2 diabetes mellitus ([place])    followed by pcp  . Wears glasses     Past Surgical History:  Procedure Laterality Date  . KNEE SURGERY Left x4  last one 1996  . LAPAROSCOPIC CHOLECYSTECTOMY  09-20-2007  @[facility]   . SHOULDER ARTHROSCOPY WITH ROTATOR CUFF REPAIR Right 10/17/2018   Procedure: Right shoulder arthroscopy with subacromial decompression, distal clavicle resection,  rotator cuff DEBRIEDMENT, LABRIAL DEBRIDMENT;  Surgeon: Justice Britain, MD;  Location: [place];  Service: Orthopedics;  Laterality: Right;  185min    There were no vitals filed for this visit.  Subjective Assessment - 01/04/19 0921    Subjective   S: The popping when I'm  sleeping is annoying. I had a pillow under my arm but if it moves and my arm slides it'll pop and wake me up.    Currently in Pain?  Yes    Pain Score  3     Pain Location  Shoulder    Pain Orientation  Right    Pain Descriptors / Indicators  Sore    Pain Type  Acute pain    Pain Radiating Towards  N/A    Pain Onset  In the past 7 days    Pain Frequency  Constant    Aggravating Factors   New exercises, movement and use    Pain Relieving Factors  ice machine and pain medication    Effect of Pain on Daily Activities  no effect on general movement and use    Multiple Pain Sites  No         [facility] OT Assessment - 01/04/19 0923      Assessment   Medical Diagnosis  Right shoulder SA, SAD, DCR      Precautions   Precautions  Shoulder    Type of Shoulder Precautions  Progress as tolerated               OT Treatments/Exercises (OP) - 01/04/19 [id number]  Exercises   Exercises  Shoulder;Work Actuary Exercises: Supine   Protraction  PROM;5 reps    Horizontal ABduction  PROM;5 reps    External Rotation  PROM;5 reps    Internal Rotation  PROM;5 reps    Flexion  PROM;5 reps    ABduction  PROM;5 reps      Shoulder Exercises: Standing   Horizontal ABduction  Strengthening;15 reps    Horizontal ABduction Weight (lbs)  2    External Rotation  Strengthening;15 reps   abducted   External Rotation Weight (lbs)  2    Internal Rotation  Strengthening;15 reps   abducted   Internal Rotation Weight (lbs)  2    Flexion  Strengthening;15 reps    Shoulder Flexion Weight (lbs)  2    ABduction  Strengthening;15 reps    Shoulder ABduction Weight (lbs)  2      Shoulder Exercises: ROM/Strengthening   Cybex Press  --   bodycraft; 12X; 4 plate   X to V Arms  2# 15X    Ball on Wall  1' flexion 1' abduction red ball    Other ROM/Strengthening Exercises  Y arm lift wall lift off; 12X    Other ROM/Strengthening Exercises  Bodycraft, retraction, 12X, 4 plate                OT Short Term Goals - 01/02/19 1229      OT SHORT TERM GOAL #1   Title  Patient will be educated and independent with HEP to increase functional use of his RUE be able to return to utilizing it as his dominant extremity for all daily tasks.    Time  6    Period  Weeks    Target Date  12/06/18      OT SHORT TERM GOAL #2   Title  Patient will increase RUE P/ROM to [facility] in order to increase ability to bath himself and wash his face without assistance.    Time  6    Period  Weeks      OT SHORT TERM GOAL #3   Title  Patient will increase RUE strength to 3/5 in order to complete waist level activities without difficulty.    Time  6    Period  Weeks      OT SHORT TERM GOAL #4   Title  Patient will decrease pain level to 6/10 or less during functional use and low level reaching tasks.    Time  6    Period  Weeks      OT SHORT TERM GOAL #5   Title  Patient will decrease fascial restrictions to mod amount or less in order to increase functional mobility needed in the RUE to complete low level reaching.    Time  6    Period  Weeks        OT Long Term Goals - 01/02/19 1229      OT LONG TERM GOAL #1   Title  Patient will return to highest level of independence using his RUE for all work and daily tasks while progressing as tolerated.    Time  12    Period  Weeks    Status  On-going      OT LONG TERM GOAL #2   Title  Patient will increase A/ROM of his RUE to [facility] in order to complete reaching tasks above his head with no difficulty.    Time  12  Period  Weeks      OT LONG TERM GOAL #3   Title  Patient will increase his RUE strength to 4+/5 in order to return to work and begin his usual job requirements without restrictions.    Time  12    Period  Weeks    Status  On-going      OT LONG TERM GOAL #4   Title  Patient will decrease his fascial restrictions to min amount or less in order to increase functional mobility needed to complete reaching tasks.    Time   12    Period  Weeks      OT LONG TERM GOAL #5   Title  patient will decrease pain level in RUE to 3/10 or less during functional daily tasks.    Time  12    Period  Weeks    Status  On-going            Plan - 01/04/19 0957    Clinical Impression Statement  A: Pt was able to complete 4 hallway laps this session versus 2 from previous session. 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 Pt and therapist discussed options for arm positioning when sleeping to prevent right arm from slipping off pillow and causing it to work him from from instability.    Body Structure / Function / Physical Skills  ADL;ROM;Scar mobility;IADL;Fascial restriction;Flexibility;Strength;Pain;UE functional use;GMC    Plan  P: Waiting to hear from [place] to approve 8 more therapy visits to focus on strengthening. Update HEP next session. Continue with work hardening task while using weighted sled push and increase trials to 6. Add ABC writing in air while hold weighted ball/hand weight.    Consulted and Agree with Plan of Care  Patient       Patient will benefit from skilled therapeutic intervention in order to improve the following deficits and impairments:   Body Structure / Function / Physical Skills: ADL, ROM, Scar mobility, IADL, Fascial restriction, Flexibility, Strength, Pain, UE functional use, GMC       Visit Diagnosis: Other symptoms and signs involving the musculoskeletal system  Acute pain of right shoulder  Stiffness of right shoulder, not elsewhere classified    Problem List Patient Active Problem List   Diagnosis Date Noted  . Newly diagnosed diabetes ([place]) 01/13/2017  . Type 2 diabetes mellitus without complications ([place]) 123XX123  . Prediabetes 05/24/2016  . Patellar tendinitis of left knee 12/11/2013  . Morbid obesity ([place]) 05/09/2013  . Osteoarthritis of left knee 11/09/2012  .  Obstructive sleep apnea 09/11/2012  . HTN (hypertension) 09/11/2012  . Impaired fasting glucose 09/11/2012  . Degenerative arthritis of left knee 06/03/2011  . S/P lateral meniscectomy of left knee 06/03/2011  . BACK PAIN 05/01/2009  . DERANGEMENT MENISCUS 09/04/2007  . ELBOW PAIN, BILATERAL 04/06/2007  . DEGENERATIVE JOINT DISEASE, KNEE 03/13/2007  . Pain in joint, lower leg 03/13/2007   Ailene Ravel, OTR/L,CBIS  [phone]  01/04/2019, 10:00 AM  [place], [place], [address] Phone: [phone]   Fax:  [phone]  Name: Adam Hahn MRN: [id number] Date of Birth: 01/02/1966

## 2019-01-04 NOTE — Telephone Encounter (Signed)
9/3 patient reports during his visit today that he has talked with the [facility] and the new referral should come in soon. If not he understand Tue 9/8/ will be cx.

## 2019-01-09 ENCOUNTER — Telehealth (HOSPITAL_COMMUNITY): Payer: Self-pay

## 2019-01-09 ENCOUNTER — Ambulatory Visit (HOSPITAL_COMMUNITY): Payer: No Typology Code available for payment source

## 2019-01-09 NOTE — Telephone Encounter (Signed)
Adam Hahn called back stating that the MD wrote a PT referral to continue care for pt. Adam Hahn will call the pt and let him know he needs to get Dr Adam Hahn @[place]  [facility] to write a new order to get more visits approved

## 2019-01-09 NOTE — Telephone Encounter (Signed)
S/w pt he has gotten MD. Nadara Mustard to write OT referral and cc. Melissa-hoping to have approval before Tx apptment. Pt and I will stay in contact concerning this approval.

## 2019-01-09 NOTE — Telephone Encounter (Signed)
Pt reports he spoke with Eline on Friday from the [facility] and he has been approved -paperwork sent to [facility] and she will fax it over tomorrow when she returns to her office. Today was cx and pt will plan to be here on Thursday.

## 2019-01-11 ENCOUNTER — Other Ambulatory Visit: Payer: Self-pay

## 2019-01-11 ENCOUNTER — Ambulatory Visit (HOSPITAL_COMMUNITY): Payer: No Typology Code available for payment source

## 2019-01-11 ENCOUNTER — Encounter (HOSPITAL_COMMUNITY): Payer: Self-pay

## 2019-01-11 DIAGNOSIS — M25611 Stiffness of right shoulder, not elsewhere classified: Secondary | ICD-10-CM

## 2019-01-11 DIAGNOSIS — R29898 Other symptoms and signs involving the musculoskeletal system: Secondary | ICD-10-CM

## 2019-01-11 DIAGNOSIS — M25511 Pain in right shoulder: Secondary | ICD-10-CM

## 2019-01-11 NOTE — Therapy (Signed)
[place], [place], [address] Phone: [phone]   Fax:  [phone]  Occupational Therapy Treatment  Patient Details  Name: Adam Hahn MRN: [id number] Date of Birth: Feb 06, 1966 Referring Provider (OT): Dr. Justice Britain   Encounter Date: 01/11/2019  OT End of Session - 01/11/19 0917    Visit Number  16    Number of Visits  35    Date for OT Re-Evaluation  01/30/19    Authorization Type  Patient is not filing with [employer] ($30 co pay- 60 visit limit). He is only filing with [facility].    Authorization Time Period  VA approved additional 15 visits. Starting count back at 1 on 01/11/19    Authorization - Visit Number  1    Authorization - Number of Visits  15    OT Start Time  [phone]    OT Stop Time  0934    OT Time Calculation (min)  30 min    Activity Tolerance  Patient tolerated treatment well    Behavior During Therapy  [facility] for tasks assessed/performed       Past Medical History:  Diagnosis Date  . Allergic rhinitis   . Arthritis    knees,  right shoulder ac joint  . History of exercise stress test 10-09-2001 in epic   normal  . HTN (hypertension)   . OSA on CPAP   . Shoulder impingement, right   . Type 2 diabetes mellitus ([place])    followed by pcp  . Wears glasses     Past Surgical History:  Procedure Laterality Date  . KNEE SURGERY Left x4  last one 1996  . LAPAROSCOPIC CHOLECYSTECTOMY  09-20-2007  @[facility]   . SHOULDER ARTHROSCOPY WITH ROTATOR CUFF REPAIR Right 10/17/2018   Procedure: Right shoulder arthroscopy with subacromial decompression, distal clavicle resection,  rotator cuff DEBRIEDMENT, LABRIAL DEBRIDMENT;  Surgeon: Justice Britain, MD;  Location: [place];  Service: Orthopedics;  Laterality: Right;  112min    There were no vitals filed for this visit.  Subjective Assessment - 01/11/19 0912    Subjective   S: I got an injection yesterday at the [facility] office. He said my shoulder  is just inflammed and I will get back in to see him in a month. I'm still off work for a while.    Currently in Pain?  No/denies         [facility] OT Assessment - 01/11/19 0913      Assessment   Medical Diagnosis  Right shoulder SA, SAD, DCR      Precautions   Precautions  Shoulder    Type of Shoulder Precautions  Progress as tolerated               OT Treatments/Exercises (OP) - 01/11/19 0913      Exercises   Exercises  Shoulder;Work Actuary Exercises: Supine   Protraction  PROM;5 reps;Strengthening;15 reps    Protraction Weight (lbs)  2    Horizontal ABduction  PROM;5 reps;Strengthening;15 reps    Horizontal ABduction Weight (lbs)  2    External Rotation  PROM;5 reps;Strengthening;12 reps   abducted   External Rotation Weight (lbs)  2    Internal Rotation  PROM;5 reps;Strengthening;12 reps   abducted   Internal Rotation Weight (lbs)  2    Flexion  PROM;5 reps;Strengthening;12 reps    Shoulder Flexion Weight (lbs)  2    ABduction  PROM;5 reps;Strengthening;12  reps    Shoulder ABduction Weight (lbs)  2      Shoulder Exercises: Therapy Ball   Other Therapy Ball Exercises  Green therapy ball: diagonals, circles, chest press, flexion, overhead press 15X      Shoulder Exercises: ROM/Strengthening   UBE (Upper Arm Bike)  Level 6 3' forward 3' flexion     Proximal Shoulder Strengthening, Supine  15X with 2# no rest breaks               OT Short Term Goals - 01/02/19 1229      OT SHORT TERM GOAL #1   Title  Patient will be educated and independent with HEP to increase functional use of his RUE be able to return to utilizing it as his dominant extremity for all daily tasks.    Time  6    Period  Weeks    Target Date  12/06/18      OT SHORT TERM GOAL #2   Title  Patient will increase RUE P/ROM to [facility] in order to increase ability to bath himself and wash his face without assistance.    Time  6    Period  Weeks      OT SHORT TERM GOAL #3    Title  Patient will increase RUE strength to 3/5 in order to complete waist level activities without difficulty.    Time  6    Period  Weeks      OT SHORT TERM GOAL #4   Title  Patient will decrease pain level to 6/10 or less during functional use and low level reaching tasks.    Time  6    Period  Weeks      OT SHORT TERM GOAL #5   Title  Patient will decrease fascial restrictions to mod amount or less in order to increase functional mobility needed in the RUE to complete low level reaching.    Time  6    Period  Weeks        OT Long Term Goals - 01/02/19 1229      OT LONG TERM GOAL #1   Title  Patient will return to highest level of independence using his RUE for all work and daily tasks while progressing as tolerated.    Time  12    Period  Weeks    Status  On-going      OT LONG TERM GOAL #2   Title  Patient will increase A/ROM of his RUE to [facility] in order to complete reaching tasks above his head with no difficulty.    Time  12    Period  Weeks      OT LONG TERM GOAL #3   Title  Patient will increase his RUE strength to 4+/5 in order to return to work and begin his usual job requirements without restrictions.    Time  12    Period  Weeks    Status  On-going      OT LONG TERM GOAL #4   Title  Patient will decrease his fascial restrictions to min amount or less in order to increase functional mobility needed to complete reaching tasks.    Time  12    Period  Weeks      OT LONG TERM GOAL #5   Title  patient will decrease pain level in RUE to 3/10 or less during functional daily tasks.    Time  12    Period  Weeks  Status  On-going            Plan - 01/11/19 0931    Clinical Impression Statement  A: Due to recent injection and MD recommendation to take it easy therapy focused on ROM, stretching, and low level shoulder stability exercises. patient was able to achieve full ROM during passive stretching. Voiced very little discomfort compared to previous sessions.  VC for form and technique were provided as needed.    Body Structure / Function / Physical Skills  ADL;ROM;Scar mobility;IADL;Fascial restriction;Flexibility;Strength;Pain;UE functional use;GMC    Plan  P: Continue with work hardening using weighted sled push and increase trials to 6. Add ABC writing in air while holding weighted ball/hand weight.    Consulted and Agree with Plan of Care  Patient       Patient will benefit from skilled therapeutic intervention in order to improve the following deficits and impairments:   Body Structure / Function / Physical Skills: ADL, ROM, Scar mobility, IADL, Fascial restriction, Flexibility, Strength, Pain, UE functional use, GMC       Visit Diagnosis: Acute pain of right shoulder  Other symptoms and signs involving the musculoskeletal system  Stiffness of right shoulder, not elsewhere classified    Problem List Patient Active Problem List   Diagnosis Date Noted  . Newly diagnosed diabetes ([place]) 01/13/2017  . Type 2 diabetes mellitus without complications ([place]) 123XX123  . Prediabetes 05/24/2016  . Patellar tendinitis of left knee 12/11/2013  . Morbid obesity ([place]) 05/09/2013  . Osteoarthritis of left knee 11/09/2012  . Obstructive sleep apnea 09/11/2012  . HTN (hypertension) 09/11/2012  . Impaired fasting glucose 09/11/2012  . Degenerative arthritis of left knee 06/03/2011  . S/P lateral meniscectomy of left knee 06/03/2011  . BACK PAIN 05/01/2009  . DERANGEMENT MENISCUS 09/04/2007  . ELBOW PAIN, BILATERAL 04/06/2007  . DEGENERATIVE JOINT DISEASE, KNEE 03/13/2007  . Pain in joint, lower leg 03/13/2007   Ailene Ravel, OTR/L,CBIS  [phone]  01/11/2019, 9:37 AM  [place] [address] [place], [place], [address] Phone: [phone]   Fax:  [phone]  Name: DEMETRIA MOLLOY MRN: [id number] Date of Birth: [birth year]

## 2019-01-16 ENCOUNTER — Other Ambulatory Visit: Payer: Self-pay

## 2019-01-16 ENCOUNTER — Encounter (HOSPITAL_COMMUNITY): Payer: Self-pay

## 2019-01-16 ENCOUNTER — Ambulatory Visit (HOSPITAL_COMMUNITY): Payer: No Typology Code available for payment source

## 2019-01-16 DIAGNOSIS — M25611 Stiffness of right shoulder, not elsewhere classified: Secondary | ICD-10-CM

## 2019-01-16 DIAGNOSIS — M25511 Pain in right shoulder: Secondary | ICD-10-CM

## 2019-01-16 DIAGNOSIS — R29898 Other symptoms and signs involving the musculoskeletal system: Secondary | ICD-10-CM | POA: Diagnosis not present

## 2019-01-16 NOTE — Therapy (Signed)
[facility], [place], [address] Phone: [phone]   Fax:  [phone]  Occupational Therapy Treatment  Patient Details  Name: Adam Hahn MRN: [id number] Date of Birth: 26-May-1965 Referring Provider (OT): Dr. Justice Britain   Encounter Date: 01/16/2019  OT End of Session - 01/16/19 1108    Visit Number  17    Number of Visits  35    Date for OT Re-Evaluation  01/30/19    Authorization Type  Patient is not filing with [employer] ($30 co pay- 60 visit limit). [facility] is only filing with [facility].    Authorization Time Period  [facility] approved additional 15 visits. Starting count back at 1 on 01/11/19    Authorization - Visit Number  2    Authorization - Number of Visits  15    OT Start Time  1030    OT Stop Time  1109    OT Time Calculation (min)  39 min    Activity Tolerance  Patient tolerated treatment well    Behavior During Therapy  WFL for tasks assessed/performed       Past Medical History:  Diagnosis Date  . Allergic rhinitis   . Arthritis    knees,  right shoulder ac joint  . History of exercise stress test 10-09-2001 in epic   normal  . HTN (hypertension)   . OSA on CPAP   . Shoulder impingement, right   . Type 2 diabetes mellitus ([place])    followed by pcp  . Wears glasses     Past Surgical History:  Procedure Laterality Date  . KNEE SURGERY Left x4  last one 1996  . LAPAROSCOPIC CHOLECYSTECTOMY  09-20-2007  @[facility]   . SHOULDER ARTHROSCOPY WITH ROTATOR CUFF REPAIR Right 10/17/2018   Procedure: Right shoulder arthroscopy with subacromial decompression, distal clavicle resection,  rotator cuff DEBRIEDMENT, LABRIAL DEBRIDMENT;  Surgeon: Justice Britain, MD;  Location: [facility];  Service: Orthopedics;  Laterality: Right;  153min    There were no vitals filed for this visit.  Subjective Assessment - 01/16/19 1047    Subjective   S: It's feeling a lot better. It's not as bad as it was before.    Currently in Pain?  Yes    Pain Score  2     Pain Location  Shoulder    Pain Orientation  Right    Pain Descriptors / Indicators  Sore    Pain Type  Acute pain    Pain Radiating Towards  N/A    Pain Onset  1 to 4 weeks ago    Pain Frequency  Constant    Aggravating Factors   new exercises, movement and use    Pain Relieving Factors  ice machine and pain medication    Effect of Pain on Daily Activities  no effect on general movement and use    Multiple Pain Sites  No         [facility] OT Assessment - 01/16/19 1048      Assessment   Medical Diagnosis  Right shoulder SA, SAD, DCR      Precautions   Precautions  Shoulder    Type of Shoulder Precautions  Progress as tolerated               OT Treatments/Exercises (OP) - 01/16/19 1049      Exercises   Exercises  Shoulder;Work Actuary Exercises: Supine   Protraction  PROM;5 reps  Horizontal ABduction  PROM;5 reps    External Rotation  PROM;5 reps    Internal Rotation  PROM;5 reps    Flexion  PROM;5 reps    ABduction  PROM;5 reps      Shoulder Exercises: Standing   Other Standing Exercises  Hold 2# handweight slight above shoulder level patient wrote the alphabet in the air- capital letters.     Other Standing Exercises  Arms on Fire; 3 minutes total with 30 secon positions. One resting break with arms resting on head.       Shoulder Exercises: ROM/Strengthening   UBE (Upper Arm Bike)  Level 6 2' forward 2' reverse    Cybex Press  --   bodycraft; 4 plate; 12X   Over Head Lace  2# bilateral wrist weights. seated with arms extended to 90 degrees.     Other ROM/Strengthening Exercises  Y arm lift wall lift off; 12X    Other ROM/Strengthening Exercises  Bodycraft, retraction, 12X, 4 plate      Work Sports administrator  Patient completed weight sled push with 184#. Completed 6 laps down and up hallway.                OT Short Term Goals - 01/02/19 1229      OT SHORT TERM  GOAL #1   Title  Patient will be educated and independent with HEP to increase functional use of his RUE be able to return to utilizing it as his dominant extremity for all daily tasks.    Time  6    Period  Weeks    Target Date  12/06/18      OT SHORT TERM GOAL #2   Title  Patient will increase RUE P/ROM to [facility] in order to increase ability to bath himself and wash his face without assistance.    Time  6    Period  Weeks      OT SHORT TERM GOAL #3   Title  Patient will increase RUE strength to 3/5 in order to complete waist level activities without difficulty.    Time  6    Period  Weeks      OT SHORT TERM GOAL #4   Title  Patient will decrease pain level to 6/10 or less during functional use and low level reaching tasks.    Time  6    Period  Weeks      OT SHORT TERM GOAL #5   Title  Patient will decrease fascial restrictions to mod amount or less in order to increase functional mobility needed in the RUE to complete low level reaching.    Time  6    Period  Weeks        OT Long Term Goals - 01/02/19 1229      OT LONG TERM GOAL #1   Title  Patient will return to highest level of independence using his RUE for all work and daily tasks while progressing as tolerated.    Time  12    Period  Weeks    Status  On-going      OT LONG TERM GOAL #2   Title  Patient will increase A/ROM of his RUE to [facility] in order to complete reaching tasks above his head with no difficulty.    Time  12    Period  Weeks      OT LONG TERM GOAL #3   Title  Patient will increase his RUE strength  to 4+/5 in order to return to work and begin his usual job requirements without restrictions.    Time  12    Period  Weeks    Status  On-going      OT LONG TERM GOAL #4   Title  Patient will decrease his fascial restrictions to min amount or less in order to increase functional mobility needed to complete reaching tasks.    Time  12    Period  Weeks      OT LONG TERM GOAL #5   Title  patient will  decrease pain level in RUE to 3/10 or less during functional daily tasks.    Time  12    Period  Weeks    Status  On-going            Plan - 01/16/19 1108    Clinical Impression Statement  A: pt reports that his shoulder feels better and more stable at start of session. Able to increase sled push trails with moderate fatigue. Add Arms on Fire to further increase shoulder and scapular stability. VC for form and technique as needed.    Body Structure / Function / Physical Skills  ADL;ROM;Scar mobility;IADL;Fascial restriction;Flexibility;Strength;Pain;UE functional use;GMC    Plan  P: Update HEP for shoulder and scapular strengthening. Add red band loop exercises: wall clocks, lateral slides, and wall slide lift off.    Consulted and Agree with Plan of Care  Patient       Patient will benefit from skilled therapeutic intervention in order to improve the following deficits and impairments:   Body Structure / Function / Physical Skills: ADL, ROM, Scar mobility, IADL, Fascial restriction, Flexibility, Strength, Pain, UE functional use, GMC       Visit Diagnosis: Acute pain of right shoulder  Other symptoms and signs involving the musculoskeletal system  Stiffness of right shoulder, not elsewhere classified    Problem List Patient Active Problem List   Diagnosis Date Noted  . Newly diagnosed diabetes ([place]) 01/13/2017  . Type 2 diabetes mellitus without complications ([place]) 123XX123  . Prediabetes 05/24/2016  . Patellar tendinitis of left knee 12/11/2013  . Morbid obesity ([place]) 05/09/2013  . Osteoarthritis of left knee 11/09/2012  . Obstructive sleep apnea 09/11/2012  . HTN (hypertension) 09/11/2012  . Impaired fasting glucose 09/11/2012  . Degenerative arthritis of left knee 06/03/2011  . S/P lateral meniscectomy of left knee 06/03/2011  . BACK PAIN 05/01/2009  . DERANGEMENT MENISCUS 09/04/2007  . ELBOW PAIN, BILATERAL 04/06/2007  . DEGENERATIVE JOINT DISEASE, KNEE  03/13/2007  . Pain in joint, lower leg 03/13/2007   Ailene Ravel, OTR/L,CBIS  [phone]  01/16/2019, 3:24 PM  [facility] [address] [place], [place], [address] Phone: [phone]   Fax:  [phone]  Name: Adam Hahn MRN: [id number] Date of Birth: 12/13/1965

## 2019-01-18 ENCOUNTER — Encounter (HOSPITAL_COMMUNITY): Payer: Self-pay

## 2019-01-18 ENCOUNTER — Other Ambulatory Visit: Payer: Self-pay

## 2019-01-18 ENCOUNTER — Ambulatory Visit (HOSPITAL_COMMUNITY): Payer: No Typology Code available for payment source

## 2019-01-18 DIAGNOSIS — M25511 Pain in right shoulder: Secondary | ICD-10-CM

## 2019-01-18 DIAGNOSIS — R29898 Other symptoms and signs involving the musculoskeletal system: Secondary | ICD-10-CM | POA: Diagnosis not present

## 2019-01-18 DIAGNOSIS — M25611 Stiffness of right shoulder, not elsewhere classified: Secondary | ICD-10-CM

## 2019-01-18 NOTE — Patient Instructions (Addendum)
Strengthening: Chest Pull - Resisted   Hold Theraband in front of body with hands about shoulder width a part. Pull band a part and back together slowly. Repeat _12-15___ times. Complete ___1_ set(s) per session.. Repeat __1__ session(s) per day.  http://orth.exer.us/926   Copyright  VHI. All rights reserved.   PNF Strengthening: Resisted   Standing with resistive band around each hand, bring right arm up and away, thumb back. Repeat __12-15__ times per set. Do __1__ sets per session. Do ___1_ sessions per day.        Resisted External Rotation: in Neutral - Bilateral   Sit or stand, tubing in both hands, elbows at sides, bent to 90, forearms forward. Pinch shoulder blades together and rotate forearms out. Keep elbows at sides. Repeat __12-15__ times per set. Do _1___ sets per session. Do ___1_ sessions per day.  http://orth.exer.us/966   Copyright  VHI. All rights reserved.   PNF Strengthening: Resisted   Standing, hold resistive band above head. Bring right arm down and out from side. Repeat _12-15___ times per set. Do __1__ sets per session. Do _1___ sessions per day.  http://orth.exer.us/922   Copyright  VHI. All rights reserved.    Wall taps with theraband  With a looped elastic band around forearms/wrists and arms at a 90 angle pressed against the wall. Keeping elbows on the wall, tap right arm out to the right. Hold for 1 second. Return right arm back to neutral. Repeat with left arm. Complete 10 times with each hand.    Wall V slides with Theraband  Place a band loop around hands/forearms and face a wall. Extend both arms diagonally into a V shape on the wall. Once your arms are extended, lift your arms/hand away from the wall pinching your shoulder blades slightly. Do not lean backwards. Remain standing straight. Lower arms slowly back into neutral position. Repeat. Complete 10 times.

## 2019-01-18 NOTE — Therapy (Signed)
[place], [place], [address] Phone: [phone]   Fax:  [phone]  Occupational Therapy Treatment  Patient Details  Name: Adam Hahn MRN: [id number] Date of Birth: 1965/08/13 Referring Provider (OT): Dr. Justice Britain   Encounter Date: 01/18/2019  OT End of Session - 01/18/19 1011    Visit Number  18    Number of Visits  35    Date for OT Re-Evaluation  01/30/19    Authorization Type  Patient is not filing with [employer] ($30 co pay- 60 visit limit). He is only filing with [facility].    Authorization Time Period  [facility] approved additional 15 visits. Starting count back at 1 on 01/11/19    Authorization - Visit Number  3    Authorization - Number of Visits  15    OT Start Time  0900    OT Stop Time  [phone]    OT Time Calculation (min)  38 min    Activity Tolerance  Patient tolerated treatment well    Behavior During Therapy  [facility] for tasks assessed/performed       Past Medical History:  Diagnosis Date  . Allergic rhinitis   . Arthritis    knees,  right shoulder ac joint  . History of exercise stress test 10-09-2001 in epic   normal  . HTN (hypertension)   . OSA on CPAP   . Shoulder impingement, right   . Type 2 diabetes mellitus ([place])    followed by pcp  . Wears glasses     Past Surgical History:  Procedure Laterality Date  . KNEE SURGERY Left x4  last one 1996  . LAPAROSCOPIC CHOLECYSTECTOMY  09-20-2007  @[facility]   . SHOULDER ARTHROSCOPY WITH ROTATOR CUFF REPAIR Right 10/17/2018   Procedure: Right shoulder arthroscopy with subacromial decompression, distal clavicle resection,  rotator cuff DEBRIEDMENT, LABRIAL DEBRIDMENT;  Surgeon: Justice Britain, MD;  Location: [facility];  Service: Orthopedics;  Laterality: Right;  151min    There were no vitals filed for this visit.  Subjective Assessment - 01/18/19 1009    Subjective   S: I get cleared to return to work on 03/03/19.I have to have an assessment  completed before I can return.    Currently in Pain?  Yes    Pain Score  2     Pain Location  Shoulder    Pain Orientation  Right    Pain Descriptors / Indicators  Sore    Pain Type  Acute pain         [facility] OT Assessment - 01/18/19 1014      Assessment   Medical Diagnosis  Right shoulder SA, SAD, DCR      Precautions   Precautions  Shoulder    Type of Shoulder Precautions  Progress as tolerated               OT Treatments/Exercises (OP) - 01/18/19 0932      Exercises   Exercises  Shoulder;Work Hardening      Shoulder Exercises: Supine   Protraction  PROM;5 reps    Horizontal ABduction  PROM;5 reps    External Rotation  PROM;5 reps    Internal Rotation  PROM;5 reps    Flexion  PROM;5 reps    ABduction  PROM;5 reps      Shoulder Exercises: Standing   Horizontal ABduction  Theraband;12 reps    Theraband Level (Shoulder Horizontal ABduction)  Level 3 (Green)  External Rotation  Theraband;12 reps    Theraband Level (Shoulder External Rotation)  Level 3 (Green)    Internal Rotation  Theraband;12 reps    Theraband Level (Shoulder Internal Rotation)  Level 3 (Green)    Other Standing Exercises  Hold 2# handweight slight above shoulder level patient wrote the alphabet in the air- capital letters.     Other Standing Exercises  green band; abduction; 12X      Shoulder Exercises: ROM/Strengthening   Over Head Lace  2# bilateral wrist weights. seated with arms extended to 90 degrees.     Other ROM/Strengthening Exercises  Green band loop; Wall slide with V lift off 10X; wall taps 10X each arm alternating      Work Sports administrator  Patient completed weight sled push with 184#. Completed 6 laps down and up hallway.              OT Education - 01/18/19 1010    Education Details  Green theraband for shoulder strengthening. Green loop theraband. Pt may stop previous exercises and focus only on ones provided today.    Person(s) Educated   Patient    Methods  Explanation;Demonstration;Handout;Verbal cues    Comprehension  Returned demonstration;Verbalized understanding       OT Short Term Goals - 01/02/19 1229      OT SHORT TERM GOAL #1   Title  Patient will be educated and independent with HEP to increase functional use of his RUE be able to return to utilizing it as his dominant extremity for all daily tasks.    Time  6    Period  Weeks    Target Date  12/06/18      OT SHORT TERM GOAL #2   Title  Patient will increase RUE P/ROM to [facility] in order to increase ability to bath himself and wash his face without assistance.    Time  6    Period  Weeks      OT SHORT TERM GOAL #3   Title  Patient will increase RUE strength to 3/5 in order to complete waist level activities without difficulty.    Time  6    Period  Weeks      OT SHORT TERM GOAL #4   Title  Patient will decrease pain level to 6/10 or less during functional use and low level reaching tasks.    Time  6    Period  Weeks      OT SHORT TERM GOAL #5   Title  Patient will decrease fascial restrictions to mod amount or less in order to increase functional mobility needed in the RUE to complete low level reaching.    Time  6    Period  Weeks        OT Long Term Goals - 01/02/19 1229      OT LONG TERM GOAL #1   Title  Patient will return to highest level of independence using his RUE for all work and daily tasks while progressing as tolerated.    Time  12    Period  Weeks    Status  On-going      OT LONG TERM GOAL #2   Title  Patient will increase A/ROM of his RUE to [facility] in order to complete reaching tasks above his head with no difficulty.    Time  12    Period  Weeks      OT LONG TERM GOAL #3  Title  Patient will increase his RUE strength to 4+/5 in order to return to work and begin his usual job requirements without restrictions.    Time  12    Period  Weeks    Status  On-going      OT LONG TERM GOAL #4   Title  Patient will decrease his  fascial restrictions to min amount or less in order to increase functional mobility needed to complete reaching tasks.    Time  12    Period  Weeks      OT LONG TERM GOAL #5   Title  patient will decrease pain level in RUE to 3/10 or less during functional daily tasks.    Time  12    Period  Weeks    Status  On-going            Plan - 01/18/19 1011    Clinical Impression Statement  A: Updated HEP. Patient no longer requires passive stretching as he has full ROM passively. Continued with work hardening activities to prepare for his return to work. Added green band loop exercises to work on scapular strength and stability. VC for form and technique.    Body Structure / Function / Physical Skills  ADL;ROM;Scar mobility;IADL;Fascial restriction;Flexibility;Strength;Pain;UE functional use;GMC    Plan  P: Pt to bring in paper that states the things he will be required to complete at his work assessment. Begin to work on Educational psychologist for this work assessment. D/C passive stretching.    Consulted and Agree with Plan of Care  Patient       Patient will benefit from skilled therapeutic intervention in order to improve the following deficits and impairments:   Body Structure / Function / Physical Skills: ADL, ROM, Scar mobility, IADL, Fascial restriction, Flexibility, Strength, Pain, UE functional use, GMC       Visit Diagnosis: Acute pain of right shoulder  Other symptoms and signs involving the musculoskeletal system  Stiffness of right shoulder, not elsewhere classified    Problem List Patient Active Problem List   Diagnosis Date Noted  . Newly diagnosed diabetes ([place]) 01/13/2017  . Type 2 diabetes mellitus without complications ([place]) 123XX123  . Prediabetes 05/24/2016  . Patellar tendinitis of left knee 12/11/2013  . Morbid obesity ([place]) 05/09/2013  . Osteoarthritis of left knee 11/09/2012  . Obstructive sleep apnea 09/11/2012  . HTN (hypertension) 09/11/2012  . Impaired  fasting glucose 09/11/2012  . Degenerative arthritis of left knee 06/03/2011  . S/P lateral meniscectomy of left knee 06/03/2011  . BACK PAIN 05/01/2009  . DERANGEMENT MENISCUS 09/04/2007  . ELBOW PAIN, BILATERAL 04/06/2007  . DEGENERATIVE JOINT DISEASE, KNEE 03/13/2007  . Pain in joint, lower leg 03/13/2007   Ailene Ravel, OTR/L,CBIS  [phone]  01/18/2019, 10:14 AM  [place], [place], [address] Phone: [phone]   Fax:  [phone]  Name: CARTHEL JANNUSCH MRN: [id number] Date of Birth: 03/28/1966

## 2019-01-23 ENCOUNTER — Encounter (HOSPITAL_COMMUNITY): Payer: Self-pay

## 2019-01-23 ENCOUNTER — Other Ambulatory Visit: Payer: Self-pay

## 2019-01-23 ENCOUNTER — Ambulatory Visit (HOSPITAL_COMMUNITY): Payer: No Typology Code available for payment source

## 2019-01-23 DIAGNOSIS — M25511 Pain in right shoulder: Secondary | ICD-10-CM

## 2019-01-23 DIAGNOSIS — R29898 Other symptoms and signs involving the musculoskeletal system: Secondary | ICD-10-CM | POA: Diagnosis not present

## 2019-01-23 DIAGNOSIS — M25611 Stiffness of right shoulder, not elsewhere classified: Secondary | ICD-10-CM

## 2019-01-23 NOTE — Therapy (Signed)
[place], [place], [address] Phone: [phone]   Fax:  [phone]  Occupational Therapy Treatment  Patient Details  Name: Adam Hahn MRN: [id number] Date of Birth: 08-07-1965 Referring Provider (OT): Dr. Justice Britain   Encounter Date: 01/23/2019  OT End of Session - 01/23/19 1058    Visit Number  19    Number of Visits  35    Date for OT Re-Evaluation  01/30/19    Authorization Type  Patient is not filing with [employer] ($30 co pay- 60 visit limit). He is only filing with [facility].    Authorization Time Period  [facility] approved additional 15 visits. Starting count back at 1 on 01/11/19    Authorization - Visit Number  4    Authorization - Number of Visits  15    OT Start Time  1030    OT Stop Time  1108    OT Time Calculation (min)  38 min    Activity Tolerance  Patient tolerated treatment well    Behavior During Therapy  WFL for tasks assessed/performed       Past Medical History:  Diagnosis Date  . Allergic rhinitis   . Arthritis    knees,  right shoulder ac joint  . History of exercise stress test 10-09-2001 in epic   normal  . HTN (hypertension)   . OSA on CPAP   . Shoulder impingement, right   . Type 2 diabetes mellitus ([place])    followed by pcp  . Wears glasses     Past Surgical History:  Procedure Laterality Date  . KNEE SURGERY Left x4  last one 1996  . LAPAROSCOPIC CHOLECYSTECTOMY  09-20-2007  @[facility]   . SHOULDER ARTHROSCOPY WITH ROTATOR CUFF REPAIR Right 10/17/2018   Procedure: Right shoulder arthroscopy with subacromial decompression, distal clavicle resection,  rotator cuff DEBRIEDMENT, LABRIAL DEBRIDMENT;  Surgeon: Justice Britain, MD;  Location: [facility];  Service: Orthopedics;  Laterality: Right;  136min    There were no vitals filed for this visit.  Subjective Assessment - 01/23/19 1037    Subjective   S: I left the paper in my other car.    Currently in Pain?  No/denies          [facility] OT Assessment - 01/23/19 1037      Assessment   Medical Diagnosis  Right shoulder SA, SAD, DCR      Precautions   Precautions  Shoulder    Type of Shoulder Precautions  Progress as tolerated               OT Treatments/Exercises (OP) - 01/23/19 1037      Exercises   Exercises  Shoulder;Work Hardening      Shoulder Exercises: Standing   Horizontal ABduction  Theraband;12 reps    Theraband Level (Shoulder Horizontal ABduction)  Level 3 (Green)    External Rotation  Theraband;12 reps    Theraband Level (Shoulder External Rotation)  Level 3 (Green)    Internal Rotation  Theraband;12 reps    Theraband Level (Shoulder Internal Rotation)  Level 3 (Green)    Other Standing Exercises  Hold 2# handweight slight above shoulder level patient wrote the alphabet in the air- capital letters.     Other Standing Exercises  green band; abduction; 12X      Shoulder Exercises: ROM/Strengthening   UBE (Upper Arm Bike)  Level 6 2' forward 2' reverse    Over Head Lace  2# bilateral  wrist weights. seated with arms extended to 90 degrees.     Other ROM/Strengthening Exercises  Green band loop; Wall slide with V lift off 10X; wall taps 10X each arm alternating. Lateral wall slide; 10X to 2,3,4 o'clock      Shoulder Exercises: Body Blade   Flexion  5 reps;15 seconds    ABduction  5 reps;15 seconds      Work Hardening Exercises   Push/Pull Sled  Pt completed weighted sled push with 184#. Completed 8 laps downand up hallway.               OT Short Term Goals - 01/02/19 1229      OT SHORT TERM GOAL #1   Title  Patient will be educated and independent with HEP to increase functional use of his RUE be able to return to utilizing it as his dominant extremity for all daily tasks.    Time  6    Period  Weeks    Target Date  12/06/18      OT SHORT TERM GOAL #2   Title  Patient will increase RUE P/ROM to [facility] in order to increase ability to bath himself and wash his face  without assistance.    Time  6    Period  Weeks      OT SHORT TERM GOAL #3   Title  Patient will increase RUE strength to 3/5 in order to complete waist level activities without difficulty.    Time  6    Period  Weeks      OT SHORT TERM GOAL #4   Title  Patient will decrease pain level to 6/10 or less during functional use and low level reaching tasks.    Time  6    Period  Weeks      OT SHORT TERM GOAL #5   Title  Patient will decrease fascial restrictions to mod amount or less in order to increase functional mobility needed in the RUE to complete low level reaching.    Time  6    Period  Weeks        OT Long Term Goals - 01/02/19 1229      OT LONG TERM GOAL #1   Title  Patient will return to highest level of independence using his RUE for all work and daily tasks while progressing as tolerated.    Time  12    Period  Weeks    Status  On-going      OT LONG TERM GOAL #2   Title  Patient will increase A/ROM of his RUE to [facility] in order to complete reaching tasks above his head with no difficulty.    Time  12    Period  Weeks      OT LONG TERM GOAL #3   Title  Patient will increase his RUE strength to 4+/5 in order to return to work and begin his usual job requirements without restrictions.    Time  12    Period  Weeks    Status  On-going      OT LONG TERM GOAL #4   Title  Patient will decrease his fascial restrictions to min amount or less in order to increase functional mobility needed to complete reaching tasks.    Time  12    Period  Weeks      OT LONG TERM GOAL #5   Title  patient will decrease pain level in RUE to 3/10 or less during  functional daily tasks.    Time  12    Period  Weeks    Status  On-going            Plan - 01/23/19 1107    Clinical Impression Statement  A: Able to increase weighted sled push trials from 6 to 8 laps. Continued to work on increasing shoulder strength and stability in order to return to work. Patient forgot to bring in his  paper from work for what areas they need to assess prior to returning to work. Requested that he bring it in for his next session.    Body Structure / Function / Physical Skills  ADL;ROM;Scar mobility;IADL;Fascial restriction;Flexibility;Strength;Pain;UE functional use;GMC    Plan  P: Pt to bring in paper that states the things he will be required to complete at his work assessment. Begin to work on Educational psychologist for this work assessment. D/C passive stretching.    Consulted and Agree with Plan of Care  Patient       Patient will benefit from skilled therapeutic intervention in order to improve the following deficits and impairments:   Body Structure / Function / Physical Skills: ADL, ROM, Scar mobility, IADL, Fascial restriction, Flexibility, Strength, Pain, UE functional use, GMC       Visit Diagnosis: Acute pain of right shoulder  Other symptoms and signs involving the musculoskeletal system  Stiffness of right shoulder, not elsewhere classified    Problem List Patient Active Problem List   Diagnosis Date Noted  . Newly diagnosed diabetes ([place]) 01/13/2017  . Type 2 diabetes mellitus without complications ([place]) 123XX123  . Prediabetes 05/24/2016  . Patellar tendinitis of left knee 12/11/2013  . Morbid obesity ([place]) 05/09/2013  . Osteoarthritis of left knee 11/09/2012  . Obstructive sleep apnea 09/11/2012  . HTN (hypertension) 09/11/2012  . Impaired fasting glucose 09/11/2012  . Degenerative arthritis of left knee 06/03/2011  . S/P lateral meniscectomy of left knee 06/03/2011  . BACK PAIN 05/01/2009  . DERANGEMENT MENISCUS 09/04/2007  . ELBOW PAIN, BILATERAL 04/06/2007  . DEGENERATIVE JOINT DISEASE, KNEE 03/13/2007  . Pain in joint, lower leg 03/13/2007   Ailene Ravel, OTR/L,CBIS  [phone]  01/23/2019, 1:12 PM  [place] [address] [place], [place], [address] Phone: [phone]   Fax:  [phone]  Name:  Adam Hahn MRN: [id number] Date of Birth: 1966-01-22

## 2019-01-25 ENCOUNTER — Other Ambulatory Visit: Payer: Self-pay

## 2019-01-25 ENCOUNTER — Ambulatory Visit (HOSPITAL_COMMUNITY): Payer: No Typology Code available for payment source | Admitting: Occupational Therapy

## 2019-01-25 ENCOUNTER — Encounter (HOSPITAL_COMMUNITY): Payer: Self-pay | Admitting: Occupational Therapy

## 2019-01-25 DIAGNOSIS — R29898 Other symptoms and signs involving the musculoskeletal system: Secondary | ICD-10-CM | POA: Diagnosis not present

## 2019-01-25 DIAGNOSIS — M25511 Pain in right shoulder: Secondary | ICD-10-CM

## 2019-01-25 NOTE — Therapy (Signed)
[place], [place], [address] Phone: [phone]   Fax:  [phone]  Occupational Therapy Treatment  Patient Details  Name: Adam Hahn MRN: [id number] Date of Birth: August 10, 1965 Referring Provider (OT): Dr. Justice Britain   Encounter Date: 01/25/2019  OT End of Session - 01/25/19 1421    Visit Number  20    Number of Visits  35    Date for OT Re-Evaluation  01/30/19    Authorization Type  Patient is not filing with [employer] ($30 co pay- 60 visit limit). He is only filing with [facility].    Authorization Time Period  [facility] approved additional 15 visits. Starting count back at 1 on 01/11/19    Authorization - Visit Number  5    Authorization - Number of Visits  15    OT Start Time  [id number]    OT Stop Time  1426    OT Time Calculation (min)  38 min    Activity Tolerance  Patient tolerated treatment well    Behavior During Therapy  WFL for tasks assessed/performed       Past Medical History:  Diagnosis Date  . Allergic rhinitis   . Arthritis    knees,  right shoulder ac joint  . History of exercise stress test 10-09-2001 in epic   normal  . HTN (hypertension)   . OSA on CPAP   . Shoulder impingement, right   . Type 2 diabetes mellitus ([place])    followed by pcp  . Wears glasses     Past Surgical History:  Procedure Laterality Date  . KNEE SURGERY Left x4  last one 1996  . LAPAROSCOPIC CHOLECYSTECTOMY  09-20-2007  @[facility]   . SHOULDER ARTHROSCOPY WITH ROTATOR CUFF REPAIR Right 10/17/2018   Procedure: Right shoulder arthroscopy with subacromial decompression, distal clavicle resection,  rotator cuff DEBRIEDMENT, LABRIAL DEBRIDMENT;  Surgeon: Justice Britain, MD;  Location: [facility];  Service: Orthopedics;  Laterality: Right;  12min    There were no vitals filed for this visit.  Subjective Assessment - 01/25/19 1348    Subjective   S: My legs are sore from pushing that sled.    Currently in Pain?  Yes    Pain Score  2     Pain Location  Shoulder    Pain Orientation  Right    Pain Descriptors / Indicators  Sore    Pain Type  Acute pain    Pain Radiating Towards  n/a    Pain Onset  1 to 4 weeks ago    Pain Frequency  Constant    Aggravating Factors   new exercises, movement    Pain Relieving Factors  ice    Effect of Pain on Daily Activities  no effect on ADLs    Multiple Pain Sites  No         [facility] OT Assessment - 01/25/19 1347      Assessment   Medical Diagnosis  Right shoulder SA, SAD, DCR      Precautions   Precautions  Shoulder    Type of Shoulder Precautions  Progress as tolerated               OT Treatments/Exercises (OP) - 01/25/19 1352      Exercises   Exercises  Shoulder;Work Hardening      Shoulder Exercises: Standing   Protraction  Theraband;12 reps    Theraband Level (Shoulder Protraction)  Level 3 (Green)  Horizontal ABduction  Theraband;12 reps    Theraband Level (Shoulder Horizontal ABduction)  Level 3 (Green)    External Rotation  Theraband;12 reps    Theraband Level (Shoulder External Rotation)  Level 3 (Green)    Internal Rotation  Theraband;12 reps    Theraband Level (Shoulder Internal Rotation)  Level 3 (Green)    ABduction  Theraband;12 reps    Theraband Level (Shoulder ABduction)  Level 3 (Green)    Retraction  Theraband;12 reps    Theraband Level (Shoulder Retraction)  Level 3 (Green)    Other Standing Exercises  Hold 2# handweight slight above shoulder level patient wrote the alphabet in the air- capital letters.     Other Standing Exercises  Arms on Fire; 3 minutes total with 30 second positions. Two resting break with arms resting on head.       Shoulder Exercises: ROM/Strengthening   UBE (Upper Arm Bike)  Level 6 2' forward 2' reverse    Cybex Press  --   bodycraft 4 plate, 15X   Ball on Wall  1' flexion 1' abduction red ball    Other ROM/Strengthening Exercises  Green band loop; Wall slide with V lift off 10X; wall taps 10X  each arm alternating. Lateral wall slide; 10X to 2,3,4 o'clock    Other ROM/Strengthening Exercises  overhead carry, 2#, 2'       Shoulder Exercises: Body Blade   Flexion  5 reps;15 seconds    ABduction  5 reps;15 seconds      Work Chief Strategy Officer from Waist to Overhead (lbs/reps)  15# from waist to top of refrigerator, 10X               OT Short Term Goals - 01/02/19 1229      OT SHORT TERM GOAL #1   Title  Patient will be educated and independent with HEP to increase functional use of his RUE be able to return to utilizing it as his dominant extremity for all daily tasks.    Time  6    Period  Weeks    Target Date  12/06/18      OT SHORT TERM GOAL #2   Title  Patient will increase RUE P/ROM to [facility] in order to increase ability to bath himself and wash his face without assistance.    Time  6    Period  Weeks      OT SHORT TERM GOAL #3   Title  Patient will increase RUE strength to 3/5 in order to complete waist level activities without difficulty.    Time  6    Period  Weeks      OT SHORT TERM GOAL #4   Title  Patient will decrease pain level to 6/10 or less during functional use and low level reaching tasks.    Time  6    Period  Weeks      OT SHORT TERM GOAL #5   Title  Patient will decrease fascial restrictions to mod amount or less in order to increase functional mobility needed in the RUE to complete low level reaching.    Time  6    Period  Weeks        OT Long Term Goals - 01/02/19 1229      OT LONG TERM GOAL #1   Title  Patient will return to highest level of independence using his RUE for all work and daily tasks while progressing as tolerated.  Time  12    Period  Weeks    Status  On-going      OT LONG TERM GOAL #2   Title  Patient will increase A/ROM of his RUE to [facility] in order to complete reaching tasks above his head with no difficulty.    Time  12    Period  Weeks      OT LONG TERM GOAL #3   Title  Patient will increase his  RUE strength to 4+/5 in order to return to work and begin his usual job requirements without restrictions.    Time  12    Period  Weeks    Status  On-going      OT LONG TERM GOAL #4   Title  Patient will decrease his fascial restrictions to min amount or less in order to increase functional mobility needed to complete reaching tasks.    Time  12    Period  Weeks      OT LONG TERM GOAL #5   Title  patient will decrease pain level in RUE to 3/10 or less during functional daily tasks.    Time  12    Period  Weeks    Status  On-going            Plan - 01/25/19 1420    Clinical Impression Statement  A: Continued with shoulder strengthening exercises today, added overhead lifting task and overhead carry. Pt completed arms on fire, bodycraft work, and green theraband strengthening working on strength and stability of RUE. Verbal cuing for form and technique.    Body Structure / Function / Physical Skills  ADL;ROM;Scar mobility;IADL;Fascial restriction;Flexibility;Strength;Pain;UE functional use;GMC    Plan  P: continue with shoulder strengthening and stability, work on functional lifting    Consulted and Agree with Plan of Care  Patient       Patient will benefit from skilled therapeutic intervention in order to improve the following deficits and impairments:   Body Structure / Function / Physical Skills: ADL, ROM, Scar mobility, IADL, Fascial restriction, Flexibility, Strength, Pain, UE functional use, GMC       Visit Diagnosis: Acute pain of right shoulder  Other symptoms and signs involving the musculoskeletal system    Problem List Patient Active Problem List   Diagnosis Date Noted  . Newly diagnosed diabetes ([place]) 01/13/2017  . Type 2 diabetes mellitus without complications ([place]) 123XX123  . Prediabetes 05/24/2016  . Patellar tendinitis of left knee 12/11/2013  . Morbid obesity ([place]) 05/09/2013  . Osteoarthritis of left knee 11/09/2012  . Obstructive sleep apnea  09/11/2012  . HTN (hypertension) 09/11/2012  . Impaired fasting glucose 09/11/2012  . Degenerative arthritis of left knee 06/03/2011  . S/P lateral meniscectomy of left knee 06/03/2011  . BACK PAIN 05/01/2009  . DERANGEMENT MENISCUS 09/04/2007  . ELBOW PAIN, BILATERAL 04/06/2007  . DEGENERATIVE JOINT DISEASE, KNEE 03/13/2007  . Pain in joint, lower leg 03/13/2007   Guadelupe Sabin, OTR/L  [phone] 01/25/2019, 2:27 PM  [place] [address] [place], [place], [address] Phone: [phone]   Fax:  [phone]  Name: Adam Hahn MRN: [id number] Date of Birth: 25-Jan-1966

## 2019-01-30 ENCOUNTER — Telehealth (HOSPITAL_COMMUNITY): Payer: Self-pay | Admitting: Family Medicine

## 2019-01-30 ENCOUNTER — Ambulatory Visit (HOSPITAL_COMMUNITY): Payer: No Typology Code available for payment source

## 2019-01-30 NOTE — Telephone Encounter (Signed)
pt left a message to cx but no reason was given  01/30/19

## 2019-02-01 ENCOUNTER — Ambulatory Visit (HOSPITAL_COMMUNITY): Payer: No Typology Code available for payment source | Attending: Orthopedic Surgery

## 2019-02-01 ENCOUNTER — Other Ambulatory Visit: Payer: Self-pay

## 2019-02-01 ENCOUNTER — Encounter (HOSPITAL_COMMUNITY): Payer: Self-pay

## 2019-02-01 DIAGNOSIS — M25511 Pain in right shoulder: Secondary | ICD-10-CM | POA: Insufficient documentation

## 2019-02-01 DIAGNOSIS — R29898 Other symptoms and signs involving the musculoskeletal system: Secondary | ICD-10-CM | POA: Diagnosis present

## 2019-02-01 DIAGNOSIS — M25611 Stiffness of right shoulder, not elsewhere classified: Secondary | ICD-10-CM | POA: Diagnosis present

## 2019-02-01 NOTE — Therapy (Signed)
[place], [place], [address] Phone: [phone]   Fax:  [phone]  Occupational Therapy Treatment Reassessment/re-cert Patient Details  Name: Adam Hahn MRN: [id number] Date of Birth: July 04, 1965 Referring Provider (OT): Dr. Justice Britain   Encounter Date: 02/01/2019  OT End of Session - 02/01/19 1717    Visit Number  21    Number of Visits  35    Date for OT Re-Evaluation  03/01/19    Authorization Type  Patient is not filing with [employer] ($30 co pay- 60 visit limit). He is only filing with [facility].    Authorization Time Period  [facility] approved additional 15 visits. Starting count back at 1 on 01/11/19    Authorization - Visit Number  6    Authorization - Number of Visits  15    OT Start Time  1030    OT Stop Time  1108    OT Time Calculation (min)  38 min    Activity Tolerance  Patient tolerated treatment well    Behavior During Therapy  WFL for tasks assessed/performed       Past Medical History:  Diagnosis Date  . Allergic rhinitis   . Arthritis    knees,  right shoulder ac joint  . History of exercise stress test 10-09-2001 in epic   normal  . HTN (hypertension)   . OSA on CPAP   . Shoulder impingement, right   . Type 2 diabetes mellitus ([place])    followed by pcp  . Wears glasses     Past Surgical History:  Procedure Laterality Date  . KNEE SURGERY Left x4  last one 1996  . LAPAROSCOPIC CHOLECYSTECTOMY  09-20-2007  '@[facility]'   . SHOULDER ARTHROSCOPY WITH ROTATOR CUFF REPAIR Right 10/17/2018   Procedure: Right shoulder arthroscopy with subacromial decompression, distal clavicle resection,  rotator cuff DEBRIEDMENT, LABRIAL DEBRIDMENT;  Surgeon: Justice Britain, MD;  Location: [facility];  Service: Orthopedics;  Laterality: Right;  152mn    There were no vitals filed for this visit.  Subjective Assessment - 02/01/19 1641    Subjective   S: I've been trying to get any information that I can  from the people that test me before coming back to work. They said they changed what they do.    Currently in Pain?  Yes    Pain Score  2     Pain Location  Shoulder    Pain Orientation  Right    Pain Descriptors / Indicators  Sore    Pain Type  Acute pain    Pain Radiating Towards  N/A    Pain Onset  1 to 4 weeks ago    Pain Frequency  Constant    Aggravating Factors   new exercises, movement, use    Pain Relieving Factors  ice    Effect of Pain on Daily Activities  no effect on ADLs         [facility] - 02/01/19 1032      Assessment   Medical Diagnosis  Right shoulder SA, SAD, DCR      Precautions   Precautions  Shoulder    Type of Shoulder Precautions  Progress as tolerated      Observation/Other Assessments   Focus on Therapeutic Outcomes (FOTO)   68/100      ROM / Strength   AROM / PROM / Strength  AROM;PROM;Strength      AROM   Overall AROM Comments  Assessed standing. IR/er abducted and was measured with shoulder adducted previously.    AROM Assessment Site  Shoulder    Right/Left Shoulder  Right    Right Shoulder Flexion  160 Degrees   previous: same   Right Shoulder ABduction  165 Degrees   previous: 160   Right Shoulder Internal Rotation  65 Degrees   previous: 60   Right Shoulder External Rotation  70 Degrees   previous: same     PROM   Overall PROM   Within functional limits for tasks performed      Strength   Overall Strength Comments  Assessed standing. IR/er adducted.     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 Lateral wall slide; 10X to 2,3,4 o'clock      Work Hardening Exercises   Push/Pull Sled  Pt completed weighted sled push with 184#. Completed 8 laps downand up hallway.    Lift from Waist to Overhead (lbs/reps)  15# from waist to top of blue cabinet, 10X. Completed once more for 10X with weight increased to 20#             OT Education - 02/01/19 1719    Education Details  reviewed goals and progress in therapy.    Person(s) Educated  Patient    Methods  Explanation    Comprehension  Verbalized understanding       OT Short Term Goals - 01/02/19 1229      OT SHORT TERM GOAL #1   Title  Patient will be educated and independent with HEP to increase functional use of his RUE be able to return to utilizing it as his dominant extremity for all daily tasks.    Time  6    Period  Weeks    Target Date  12/06/18      OT SHORT TERM GOAL #2   Title  Patient will increase RUE P/ROM to [facility] in order to increase ability to bath himself and wash his face without assistance.    Time  6    Period  Weeks      OT SHORT TERM GOAL #3   Title  Patient will increase RUE strength to 3/5 in order to complete waist level activities without difficulty.    Time  6    Period  Weeks      OT SHORT TERM GOAL #4   Title  Patient will decrease pain level to 6/10 or less during functional use and low level reaching tasks.    Time  6    Period  Weeks      OT SHORT TERM GOAL #5   Title  Patient will decrease fascial restrictions to mod amount or less in order to increase functional mobility needed in the  RUE to complete low level reaching.    Time  6    Period  Weeks        OT Long Term Goals - 02/01/19 1040      OT LONG TERM GOAL #1   Title  Patient will return to highest level of independence using his RUE for all work and daily tasks while  progressing as tolerated.    Time  12    Period  Weeks    Status  On-going      OT LONG TERM GOAL #2   Title  Patient will increase A/ROM of his RUE to [facility] in order to complete reaching tasks above his head with no difficulty.    Time  12    Period  Weeks      OT LONG TERM GOAL #3   Title  Patient will increase his RUE strength to 4+/5 in order to return to work and begin his usual job requirements without restrictions.    Time  12    Period  Weeks    Status  Partially Met      OT LONG TERM GOAL #4   Title  Patient will decrease his fascial restrictions to min amount or less in order to increase functional mobility needed to complete reaching tasks.    Time  12    Period  Weeks      OT LONG TERM GOAL #5   Title  patient will decrease pain level in RUE to 3/10 or less during functional daily tasks.    Time  12    Period  Weeks    Status  On-going            Plan - 02/01/19 1719    Clinical Impression Statement  A: reassessment completed this date. patient has made great progress in therapy and has met 2/5 LTGs. Pt has great shoulder strength when he needs it instantaneously although has increased difficulty with sustained shoulder and scapular strengthening. He continues to require additional therapy services to focus on scapular strength and endurance.    Body Structure / Function / Physical Skills  ADL;ROM;Scar mobility;IADL;Fascial restriction;Flexibility;Strength;Pain;UE functional use;GMC    Plan  P: Continue OT services focusing on mentioned deficits 2x a week for 4 more weeks. Continue with work simulated activities such as sled push and weighted box lift to help prepare to return to work. Continue with overhead activities that focus on increasing his shoulder/scapular stability.    Consulted and Agree with Plan of Care  Patient       Patient will benefit from skilled therapeutic intervention in order to improve the following deficits and impairments:   Body Structure  / Function / Physical Skills: ADL, ROM, Scar mobility, IADL, Fascial restriction, Flexibility, Strength, Pain, UE functional use, GMC       Visit Diagnosis: Acute pain of right shoulder - Plan: Ot plan of care cert/re-cert  Other symptoms and signs involving the musculoskeletal system - Plan: Ot plan of care cert/re-cert  Stiffness of right shoulder, not elsewhere classified - Plan: Ot plan of care cert/re-cert    Problem List Patient Active Problem List   Diagnosis Date Noted  . Newly diagnosed diabetes ([place]) 01/13/2017  . Type 2 diabetes mellitus without complications ([place]) 02/54/2706  . Prediabetes 05/24/2016  . Patellar tendinitis of left knee 12/11/2013  . Morbid obesity ([place]) 05/09/2013  . Osteoarthritis of left knee 11/09/2012  . Obstructive sleep apnea 09/11/2012  . HTN (hypertension) 09/11/2012  .  Impaired fasting glucose 09/11/2012  . Degenerative arthritis of left knee 06/03/2011  . S/P lateral meniscectomy of left knee 06/03/2011  . BACK PAIN 05/01/2009  . DERANGEMENT MENISCUS 09/04/2007  . ELBOW PAIN, BILATERAL 04/06/2007  . DEGENERATIVE JOINT DISEASE, KNEE 03/13/2007  . Pain in joint, lower leg 03/13/2007   Ailene Ravel, OTR/L,CBIS  [phone]  02/01/2019, 6:16 PM  [facility] [address] [place], [place], [address] Phone: [phone]   Fax:  [phone]  Name: Adam Hahn MRN: [id number] Date of Birth: 10-22-1965

## 2019-02-06 ENCOUNTER — Ambulatory Visit (HOSPITAL_COMMUNITY): Payer: No Typology Code available for payment source | Admitting: Specialist

## 2019-02-06 ENCOUNTER — Other Ambulatory Visit: Payer: Self-pay

## 2019-02-06 ENCOUNTER — Encounter (HOSPITAL_COMMUNITY): Payer: Self-pay | Admitting: Specialist

## 2019-02-06 DIAGNOSIS — M25511 Pain in right shoulder: Secondary | ICD-10-CM | POA: Diagnosis not present

## 2019-02-06 DIAGNOSIS — R29898 Other symptoms and signs involving the musculoskeletal system: Secondary | ICD-10-CM

## 2019-02-06 DIAGNOSIS — M25611 Stiffness of right shoulder, not elsewhere classified: Secondary | ICD-10-CM

## 2019-02-06 NOTE — Therapy (Signed)
[place], [place], [address] Phone: [phone]   Fax:  [phone]  Occupational Therapy Treatment  Patient Details  Name: Adam Hahn MRN: [id number] Date of Birth: [birth year] Referring Provider (OT): Dr. Justice Britain   Encounter Date: 02/06/2019  OT End of Session - 02/06/19 1121    Visit Number  22    Number of Visits  35    Date for OT Re-Evaluation  03/01/19    Authorization Type  Patient is not filing with [employer] ($30 co pay- 60 visit limit). He is only filing with [facility].    Authorization Time Period  [facility] approved additional 15 visits. Starting count back at 1 on 01/11/19    Authorization - Visit Number  7    Authorization - Number of Visits  15    OT Start Time  7902    OT Stop Time  1117    OT Time Calculation (min)  37 min    Activity Tolerance  Patient tolerated treatment well    Behavior During Therapy  WFL for tasks assessed/performed       Past Medical History:  Diagnosis Date  . Allergic rhinitis   . Arthritis    knees,  right shoulder ac joint  . History of exercise stress test 10-09-2001 in epic   normal  . HTN (hypertension)   . OSA on CPAP   . Shoulder impingement, right   . Type 2 diabetes mellitus ([place])    followed by pcp  . Wears glasses     Past Surgical History:  Procedure Laterality Date  . KNEE SURGERY Left x4  last one 1996  . LAPAROSCOPIC CHOLECYSTECTOMY  09-20-2007  _0   . SHOULDER ARTHROSCOPY WITH ROTATOR CUFF REPAIR Right 10/17/2018   Procedure: Right shoulder arthroscopy with subacromial decompression, distal clavicle resection,  rotator cuff DEBRIEDMENT, LABRIAL DEBRIDMENT;  Surgeon: Justice Britain, MD;  Location: [facility];  Service: Orthopedics;  Laterality: Right;  137mn    There were no vitals filed for this visit.  Subjective Assessment - 02/06/19 1119    Subjective   S:  I dont know if its just the shot wearing off, but I have had alot of  soreness in the anterior portion of my shoulder near where my surgical incision is.  I tried heat, ice, tens yesterday and it didnt help it.    Currently in Pain?  Yes    Pain Score  3     Pain Location  Shoulder    Pain Orientation  Right;Anterior    Pain Descriptors / Indicators  Sore    Pain Type  Acute pain                   OT Treatments/Exercises (OP) - 02/06/19 0001      Exercises   Exercises  Shoulder      Shoulder Exercises: ROM/Strengthening   UBE (Upper Arm Bike)  level 7 3' forward and 3 minute reverse at 5.0 speed      Other ROM/Strengthening Exercises  holding large green therapy ball completed 20 repetitions of overhead press, chest press, completed 20" hold into chest press (flexed shoulder at 90) 3 times with min fatigue, held ball with shoulder flexed to 90 and completed 20 vertical pulses, horizontal pulses, left hand on top vertical pulses and left hand on bottom vertical pulses, with minimal fatigue patient reports these exercises are a significant challenge for sustained activity tolerance for his  right shoulder.        Manual Therapy   Manual Therapy  Myofascial release    Manual therapy comments  Completed separate from exercises.     Myofascial Release  myofascial release to anterior shoulder and surgical scar region to decrease fascial restrictions and soft tissue tightness.               OT Education - 02/06/19 1120    Education Details  recommended heat and ice massage to anterior shoulder.  focus on theraband exercises holding contraction for 15-30" to work on improving sustained activity tolerance.    Person(s) Educated  Patient    Methods  Explanation    Comprehension  Verbalized understanding       OT Short Term Goals - 01/02/19 1229      OT SHORT TERM GOAL #1   Title  Patient will be educated and independent with HEP to increase functional use of his RUE be able to return to utilizing it as his dominant extremity for all daily  tasks.    Time  6    Period  Weeks    Target Date  12/06/18      OT SHORT TERM GOAL #2   Title  Patient will increase RUE P/ROM to [facility] in order to increase ability to bath himself and wash his face without assistance.    Time  6    Period  Weeks      OT SHORT TERM GOAL #3   Title  Patient will increase RUE strength to 3/5 in order to complete waist level activities without difficulty.    Time  6    Period  Weeks      OT SHORT TERM GOAL #4   Title  Patient will decrease pain level to 6/10 or less during functional use and low level reaching tasks.    Time  6    Period  Weeks      OT SHORT TERM GOAL #5   Title  Patient will decrease fascial restrictions to mod amount or less in order to increase functional mobility needed in the RUE to complete low level reaching.    Time  6    Period  Weeks        OT Long Term Goals - 02/01/19 1040      OT LONG TERM GOAL #1   Title  Patient will return to highest level of independence using his RUE for all work and daily tasks while progressing as tolerated.    Time  12    Period  Weeks    Status  On-going      OT LONG TERM GOAL #2   Title  Patient will increase A/ROM of his RUE to [facility] in order to complete reaching tasks above his head with no difficulty.    Time  12    Period  Weeks      OT LONG TERM GOAL #3   Title  Patient will increase his RUE strength to 4+/5 in order to return to work and begin his usual job requirements without restrictions.    Time  12    Period  Weeks    Status  Partially Met      OT LONG TERM GOAL #4   Title  Patient will decrease his fascial restrictions to min amount or less in order to increase functional mobility needed to complete reaching tasks.    Time  12    Period  Weeks  OT LONG TERM GOAL #5   Title  patient will decrease pain level in RUE to 3/10 or less during functional daily tasks.    Time  12    Period  Weeks    Status  On-going            Plan - 02/06/19 1122    Clinical  Impression Statement  A:  Patient presents with increased pain and soreness in anterior shoulder, near surgical incision.  Therefore, added manual  therapy to session to improve comfort.  Minimal fascial restrictions noted in anterior shoulder near surgical incision.  Patient had minimal relief from pain after manual therapy.  Therapeutic exercises focused on sustained activity tolerance with shoulder height and above activities.    Body Structure / Function / Physical Skills  ADL;ROM;Scar mobility;IADL;Fascial restriction;Flexibility;Strength;Pain;UE functional use;GMC    Plan  P:  Follow up on anterior shoulder pain.  continue sustained activity tolerance with ball pulses and holds, adding cuff weight to patients wrist for increased weigth, resume work hardening tasks.       Patient will benefit from skilled therapeutic intervention in order to improve the following deficits and impairments:   Body Structure / Function / Physical Skills: ADL, ROM, Scar mobility, IADL, Fascial restriction, Flexibility, Strength, Pain, UE functional use, GMC       Visit Diagnosis: Other symptoms and signs involving the musculoskeletal system  Stiffness of right shoulder, not elsewhere classified    Problem List Patient Active Problem List   Diagnosis Date Noted  . Newly diagnosed diabetes ([place]) 01/13/2017  . Type 2 diabetes mellitus without complications ([place]) 58/59/2924  . Prediabetes 05/24/2016  . Patellar tendinitis of left knee 12/11/2013  . Morbid obesity ([place]) 05/09/2013  . Osteoarthritis of left knee 11/09/2012  . Obstructive sleep apnea 09/11/2012  . HTN (hypertension) 09/11/2012  . Impaired fasting glucose 09/11/2012  . Degenerative arthritis of left knee 06/03/2011  . S/P lateral meniscectomy of left knee 06/03/2011  . BACK PAIN 05/01/2009  . DERANGEMENT MENISCUS 09/04/2007  . ELBOW PAIN, BILATERAL 04/06/2007  . DEGENERATIVE JOINT DISEASE, KNEE 03/13/2007  . Pain in joint, lower leg  03/13/2007    Vangie Bicker, [place], OTR/L [phone]  02/06/2019, 11:33 AM  [facility] [address] [place], [place], [address] Phone: [phone]   Fax:  [phone]  Name: Adam Hahn MRN: [id number] Date of Birth: 06-18-1965

## 2019-02-07 ENCOUNTER — Telehealth (HOSPITAL_COMMUNITY): Payer: Self-pay | Admitting: Specialist

## 2019-02-07 NOTE — Telephone Encounter (Signed)
Pt reports he had an injection in his arm today from MD and he will not be here tomorrow. NF 02/07/2019

## 2019-02-08 ENCOUNTER — Ambulatory Visit (HOSPITAL_COMMUNITY): Payer: No Typology Code available for payment source | Admitting: Specialist

## 2019-02-09 ENCOUNTER — Encounter

## 2019-02-13 ENCOUNTER — Other Ambulatory Visit: Payer: Self-pay

## 2019-02-13 ENCOUNTER — Ambulatory Visit (HOSPITAL_COMMUNITY): Payer: No Typology Code available for payment source

## 2019-02-13 ENCOUNTER — Telehealth (HOSPITAL_COMMUNITY): Payer: Self-pay

## 2019-02-13 DIAGNOSIS — M25511 Pain in right shoulder: Secondary | ICD-10-CM

## 2019-02-13 DIAGNOSIS — R29898 Other symptoms and signs involving the musculoskeletal system: Secondary | ICD-10-CM

## 2019-02-13 DIAGNOSIS — M25611 Stiffness of right shoulder, not elsewhere classified: Secondary | ICD-10-CM

## 2019-02-13 NOTE — Therapy (Signed)
[facility], [place], [address] Phone: [phone]   Fax:  [phone]  Occupational Therapy Treatment  Patient Details  Name: Adam Hahn MRN: [id number] Date of Birth: 31-Aug-1965 Referring Provider (OT): Dr. Justice Britain   Encounter Date: 02/13/2019  OT End of Session - 02/13/19 1340    Visit Number  23    Number of Visits  35    Date for OT Re-Evaluation  03/01/19    Authorization Type  Patient is not filing with [employer] ($30 co pay- 60 visit limit). [facility] is only [facility] with [facility].    Authorization Time Period  [facility] approved additional 15 visits. Starting count back at 1 on 01/11/19    Authorization - Visit Number  8    Authorization - Number of Visits  15    OT Start Time  1302    OT Stop Time  1340    OT Time Calculation (min)  38 min    Activity Tolerance  Patient tolerated treatment well    Behavior During Therapy  WFL for tasks assessed/performed       Past Medical History:  Diagnosis Date  . Allergic rhinitis   . Arthritis    knees,  right shoulder ac joint  . History of exercise stress test 10-09-2001 in epic   normal  . HTN (hypertension)   . OSA on CPAP   . Shoulder impingement, right   . Type 2 diabetes mellitus ([place])    followed by pcp  . Wears glasses     Past Surgical History:  Procedure Laterality Date  . KNEE SURGERY Left x4  last one 1996  . LAPAROSCOPIC CHOLECYSTECTOMY  09-20-2007  _0   . SHOULDER ARTHROSCOPY WITH ROTATOR CUFF REPAIR Right 10/17/2018   Procedure: Right shoulder arthroscopy with subacromial decompression, distal clavicle resection,  rotator cuff DEBRIEDMENT, LABRIAL DEBRIDMENT;  Surgeon: Justice Britain, MD;  Location: [facility];  Service: Orthopedics;  Laterality: Right;  115mn    There were no vitals filed for this visit.  Subjective Assessment - 02/13/19 1303    Subjective   S: I think the weather is making my shoulder more sore and it's not as  sore as when I got the shot the other day. I had to cancel because my shoulder was so sore.    Currently in Pain?  Yes    Pain Score  3     Pain Location  Shoulder    Pain Orientation  Right;Anterior    Pain Descriptors / Indicators  Sore    Pain Type  Acute pain    Pain Radiating Towards  N/A    Pain Onset  In the past 7 days    Pain Frequency  Constant    Aggravating Factors   just recently received a steriod injection. Maybe weather change.    Pain Relieving Factors  ice machine, pain medication- prescription, therapy stretches    Effect of Pain on Daily Activities  no effect    Multiple Pain Sites  No         [facility] OT Assessment - 02/13/19 1310      Assessment   Medical Diagnosis  Right shoulder SA, SAD, DCR      Precautions   Precautions  Shoulder    Type of Shoulder Precautions  Progress as tolerated               OT Treatments/Exercises (OP) - 02/13/19 1310  Exercises   Exercises  Shoulder;Work Hardening      Shoulder Exercises: Standing   Other Standing Exercises  Overhead carry 2# for 2'      Shoulder Exercises: ROM/Strengthening   UBE (Upper Arm Bike)  level 7 3' forward and 3 minute reverse at 5.0 speed      Over Head Lace  2# bilateral wrist weights. seated with arms extended to 90 degrees.     Ball on Wall  1' flexion 1' abduction using washcloth with 2# wrist weight    Other ROM/Strengthening Exercises  holding large green therapy ball completed 20 repetitions of overhead press, chest press, completed 20" hold into chest press (flexed shoulder at 90) 3 times with min fatigue, held ball with shoulder flexed to 90 and completed 20 vertical pulses, horizontal pulses, left hand on top vertical pulses and left hand on bottom vertical pulses, with minimal fatigue patient reports these exercises are a significant challenge for sustained activity tolerance for his right shoulder.      Other ROM/Strengthening Exercises  Arms on Fire; 3 minutes total with 30  second holds; 2# wrist weight      Shoulder Exercises: Body Blade   Flexion  5 reps   20"   ABduction  5 reps   20"     Work Chief Strategy Officer from Waist to Overhead (lbs/reps)  20# waist to overhead- top of blue cabinet 10X               OT Short Term Goals - 01/02/19 1229      OT SHORT TERM GOAL #1   Title  Patient will be educated and independent with HEP to increase functional use of his RUE be able to return to utilizing it as his dominant extremity for all daily tasks.    Time  6    Period  Weeks    Target Date  12/06/18      OT SHORT TERM GOAL #2   Title  Patient will increase RUE P/ROM to [facility] in order to increase ability to bath himself and wash his face without assistance.    Time  6    Period  Weeks      OT SHORT TERM GOAL #3   Title  Patient will increase RUE strength to 3/5 in order to complete waist level activities without difficulty.    Time  6    Period  Weeks      OT SHORT TERM GOAL #4   Title  Patient will decrease pain level to 6/10 or less during functional use and low level reaching tasks.    Time  6    Period  Weeks      OT SHORT TERM GOAL #5   Title  Patient will decrease fascial restrictions to mod amount or less in order to increase functional mobility needed in the RUE to complete low level reaching.    Time  6    Period  Weeks        OT Long Term Goals - 02/01/19 1040      OT LONG TERM GOAL #1   Title  Patient will return to highest level of independence using his RUE for all work and daily tasks while progressing as tolerated.    Time  12    Period  Weeks    Status  On-going      OT LONG TERM GOAL #2   Title  Patient will increase A/ROM of his  RUE to [facility] in order to complete reaching tasks above his head with no difficulty.    Time  12    Period  Weeks      OT LONG TERM GOAL #3   Title  Patient will increase his RUE strength to 4+/5 in order to return to work and begin his usual job requirements without  restrictions.    Time  12    Period  Weeks    Status  Partially Met      OT LONG TERM GOAL #4   Title  Patient will decrease his fascial restrictions to min amount or less in order to increase functional mobility needed to complete reaching tasks.    Time  12    Period  Weeks      OT LONG TERM GOAL #5   Title  patient will decrease pain level in RUE to 3/10 or less during functional daily tasks.    Time  12    Period  Weeks    Status  On-going            Plan - 02/13/19 1341    Clinical Impression Statement  A: pt reports a feeling of tightness and some soreness from his most recent shoulder injection. Session focused on shoulder stability and strength especially while working overhead. Pt demonstrated muscle fatigue several times during session with periodic rest breaks as needed. VC for form and technique were provided.    Body Structure / Function / Physical Skills  ADL;ROM;Scar mobility;IADL;Fascial restriction;Flexibility;Strength;Pain;UE functional use;GMC    Plan  P: Continue with work hardening tasks.    Consulted and Agree with Plan of Care  Patient       Patient will benefit from skilled therapeutic intervention in order to improve the following deficits and impairments:   Body Structure / Function / Physical Skills: ADL, ROM, Scar mobility, IADL, Fascial restriction, Flexibility, Strength, Pain, UE functional use, GMC       Visit Diagnosis: Acute pain of right shoulder  Stiffness of right shoulder, not elsewhere classified  Other symptoms and signs involving the musculoskeletal system    Problem List Patient Active Problem List   Diagnosis Date Noted  . Newly diagnosed diabetes ([place]) 01/13/2017  . Type 2 diabetes mellitus without complications ([place]) 54/27/0623  . Prediabetes 05/24/2016  . Patellar tendinitis of left knee 12/11/2013  . Morbid obesity ([place]) 05/09/2013  . Osteoarthritis of left knee 11/09/2012  . Obstructive sleep apnea 09/11/2012  .  HTN (hypertension) 09/11/2012  . Impaired fasting glucose 09/11/2012  . Degenerative arthritis of left knee 06/03/2011  . S/P lateral meniscectomy of left knee 06/03/2011  . BACK PAIN 05/01/2009  . DERANGEMENT MENISCUS 09/04/2007  . ELBOW PAIN, BILATERAL 04/06/2007  . DEGENERATIVE JOINT DISEASE, KNEE 03/13/2007  . Pain in joint, lower leg 03/13/2007   Ailene Ravel, OTR/L,CBIS  [phone]  02/13/2019, 2:43 PM  [facility] [address] [place], [place], [address] Phone: [phone]   Fax:  [phone]  Name: Adam Hahn MRN: [id number] Date of Birth: 1965/11/13

## 2019-02-15 ENCOUNTER — Ambulatory Visit (HOSPITAL_COMMUNITY): Payer: No Typology Code available for payment source

## 2019-02-19 ENCOUNTER — Other Ambulatory Visit: Payer: Self-pay

## 2019-02-19 ENCOUNTER — Encounter (HOSPITAL_COMMUNITY): Payer: Self-pay

## 2019-02-19 ENCOUNTER — Ambulatory Visit (HOSPITAL_COMMUNITY): Payer: No Typology Code available for payment source

## 2019-02-19 DIAGNOSIS — R29898 Other symptoms and signs involving the musculoskeletal system: Secondary | ICD-10-CM

## 2019-02-19 DIAGNOSIS — M25611 Stiffness of right shoulder, not elsewhere classified: Secondary | ICD-10-CM

## 2019-02-19 DIAGNOSIS — M25511 Pain in right shoulder: Secondary | ICD-10-CM | POA: Diagnosis not present

## 2019-02-19 NOTE — Therapy (Signed)
[facility] [place], [place], [address] Phone: [phone]   Fax:  [phone]  Occupational Therapy Treatment  Patient Details  Name: Adam Hahn MRN: [id number] Date of Birth: 09-05-1965 Referring Provider (OT): Dr. Justice Britain   Encounter Date: 02/19/2019  OT End of Session - 02/19/19 1330    Visit Number  24    Number of Visits  35    Date for OT Re-Evaluation  03/01/19    Authorization Type  Patient is not filing with [employer] ($30 co pay- 60 visit limit). He is only [facility] with [facility].    Authorization Time Period  [facility] approved additional 15 visits. Starting count back at 1 on 01/11/19    Authorization - Visit Number  9    Authorization - Number of Visits  15    OT Start Time  1300    OT Stop Time  1339    OT Time Calculation (min)  39 min    Activity Tolerance  Patient tolerated treatment well    Behavior During Therapy  WFL for tasks assessed/performed       Past Medical History:  Diagnosis Date  . Allergic rhinitis   . Arthritis    knees,  right shoulder ac joint  . History of exercise stress test 10-09-2001 in epic   normal  . HTN (hypertension)   . OSA on CPAP   . Shoulder impingement, right   . Type 2 diabetes mellitus ([place])    followed by pcp  . Wears glasses     Past Surgical History:  Procedure Laterality Date  . KNEE SURGERY Left x4  last one 1996  . LAPAROSCOPIC CHOLECYSTECTOMY  09-20-2007  '@AP'   . SHOULDER ARTHROSCOPY WITH ROTATOR CUFF REPAIR Right 10/17/2018   Procedure: Right shoulder arthroscopy with subacromial decompression, distal clavicle resection,  rotator cuff DEBRIEDMENT, LABRIAL DEBRIDMENT;  Surgeon: Justice Britain, MD;  Location: [facility];  Service: Orthopedics;  Laterality: Right;  11mn    There were no vitals filed for this visit.  Subjective Assessment - 02/19/19 1330    Subjective   S: It's feeling tight. Could you stretch it out today?    Currently in  Pain?  No/denies         [facility] 02/19/19 1311      Assessment   Medical Diagnosis  Right shoulder SA, SAD, DCR      Precautions   Precautions  Shoulder    Type of Shoulder Precautions  Progress as tolerated               OT Treatments/Exercises (OP) - 02/19/19 1312      Exercises   Exercises  Shoulder;Work Hardening      Shoulder Exercises: Supine   Protraction  PROM;5 reps    Horizontal ABduction  PROM;5 reps    External Rotation  PROM;5 reps    Internal Rotation  PROM;5 reps    Flexion  PROM;5 reps    ABduction  PROM;5 reps      Shoulder Exercises: Standing   Other Standing Exercises  Overhead carry 2# for 2'    Other Standing Exercises  Proximal shoulder strengthening: writing alaphabet with RUE with 2# wrist weight.       Shoulder Exercises: Therapy Ball   Other Therapy Ball Exercises  green therapy ball: flexion; 20X; 2# bilateral wrist weights.       Shoulder Exercises: ROM/Strengthening   UBE (Upper Arm Bike)  Level 7 2' forward 2' reverse    pace: 12.0   Over Head Lace  2# bilateral wrist weights. seated with arms extended to 90 degrees.     Ball on Wall  1' flexion 1' abduction using washcloth with 2# wrist weight    Other ROM/Strengthening Exercises  holding large green therapy ball completed 20 repetitions of overhead press, chest press, completed 20" hold into chest press (flexed shoulder at 90) 3 times with min fatigue, held ball with shoulder flexed to 90 and completed 20 vertical pulses, horizontal pulses, left hand on top vertical pulses and left hand on bottom vertical pulses, with minimal fatigue patient reports these exercises are a significant challenge for sustained activity tolerance for his right shoulder.      Other ROM/Strengthening Exercises  Arms on Fire; 3 minutes total with 30 second holds; 2# wrist weight      Shoulder Exercises: Body Blade   Flexion  5 reps   20" hold   ABduction  5 reps   20" hold               OT Short Term Goals - 01/02/19 1229      OT SHORT TERM GOAL #1   Title  Patient will be educated and independent with HEP to increase functional use of his RUE be able to return to utilizing it as his dominant extremity for all daily tasks.    Time  6    Period  Weeks    Target Date  12/06/18      OT SHORT TERM GOAL #2   Title  Patient will increase RUE P/ROM to [facility] in order to increase ability to bath himself and wash his face without assistance.    Time  6    Period  Weeks      OT SHORT TERM GOAL #3   Title  Patient will increase RUE strength to 3/5 in order to complete waist level activities without difficulty.    Time  6    Period  Weeks      OT SHORT TERM GOAL #4   Title  Patient will decrease pain level to 6/10 or less during functional use and low level reaching tasks.    Time  6    Period  Weeks      OT SHORT TERM GOAL #5   Title  Patient will decrease fascial restrictions to mod amount or less in order to increase functional mobility needed in the RUE to complete low level reaching.    Time  6    Period  Weeks        OT Long Term Goals - 02/01/19 1040      OT LONG TERM GOAL #1   Title  Patient will return to highest level of independence using his RUE for all work and daily tasks while progressing as tolerated.    Time  12    Period  Weeks    Status  On-going      OT LONG TERM GOAL #2   Title  Patient will increase A/ROM of his RUE to [facility] in order to complete reaching tasks above his head with no difficulty.    Time  12    Period  Weeks      OT LONG TERM GOAL #3   Title  Patient will increase his RUE strength to 4+/5 in order to return to work and begin his usual job requirements without restrictions.    Time  12    Period  Weeks    Status  Partially Met      OT LONG TERM GOAL #4   Title  Patient will decrease his fascial restrictions to min amount or less in order to increase functional mobility needed to complete reaching tasks.     Time  12    Period  Weeks      OT LONG TERM GOAL #5   Title  patient will decrease pain level in RUE to 3/10 or less during functional daily tasks.    Time  12    Period  Weeks    Status  On-going            Plan - 02/19/19 1338    Clinical Impression Statement  A: Continued to focus on shoulder and scapular strengthening to prepare of return to work. Patient required occassional reast breaks during exercises due to muscle burning although able to return quickly not requiring any additional length of time to rest. VC for form and technique were provided.    Body Structure / Function / Physical Skills  ADL;ROM;Scar mobility;IADL;Fascial restriction;Flexibility;Strength;Pain;UE functional use;GMC    Plan  P: Continue with work hardening tasks.    Consulted and Agree with Plan of Care  Patient       Patient will benefit from skilled therapeutic intervention in order to improve the following deficits and impairments:   Body Structure / Function / Physical Skills: ADL, ROM, Scar mobility, IADL, Fascial restriction, Flexibility, Strength, Pain, UE functional use, GMC       Visit Diagnosis: Acute pain of right shoulder  Stiffness of right shoulder, not elsewhere classified  Other symptoms and signs involving the musculoskeletal system    Problem List Patient Active Problem List   Diagnosis Date Noted  . Newly diagnosed diabetes ([place]) 01/13/2017  . Type 2 diabetes mellitus without complications ([place]) 32/54/9826  . Prediabetes 05/24/2016  . Patellar tendinitis of left knee 12/11/2013  . Morbid obesity ([place]) 05/09/2013  . Osteoarthritis of left knee 11/09/2012  . Obstructive sleep apnea 09/11/2012  . HTN (hypertension) 09/11/2012  . Impaired fasting glucose 09/11/2012  . Degenerative arthritis of left knee 06/03/2011  . S/P lateral meniscectomy of left knee 06/03/2011  . BACK PAIN 05/01/2009  . DERANGEMENT MENISCUS 09/04/2007  . ELBOW PAIN, BILATERAL 04/06/2007  .  DEGENERATIVE JOINT DISEASE, KNEE 03/13/2007  . Pain in joint, lower leg 03/13/2007   Ailene Ravel, OTR/L,CBIS  [phone]  02/19/2019, 2:03 PM  [facility] [address] [place], [place], [address] Phone: [phone]   Fax:  [phone]  Name: Adam Hahn MRN: [id number] Date of Birth: 1966-01-10

## 2019-02-20 ENCOUNTER — Encounter (HOSPITAL_COMMUNITY): Payer: BC Managed Care – PPO | Admitting: Occupational Therapy

## 2019-02-22 ENCOUNTER — Ambulatory Visit (HOSPITAL_COMMUNITY): Payer: No Typology Code available for payment source | Admitting: Occupational Therapy

## 2019-02-22 ENCOUNTER — Other Ambulatory Visit: Payer: Self-pay

## 2019-02-22 ENCOUNTER — Encounter (HOSPITAL_COMMUNITY): Payer: Self-pay | Admitting: Occupational Therapy

## 2019-02-22 DIAGNOSIS — M25511 Pain in right shoulder: Secondary | ICD-10-CM | POA: Diagnosis not present

## 2019-02-22 DIAGNOSIS — R29898 Other symptoms and signs involving the musculoskeletal system: Secondary | ICD-10-CM

## 2019-02-22 NOTE — Therapy (Signed)
[place], [place], [address] Phone: [phone]   Fax:  [phone]  Occupational Therapy Treatment  Patient Details  Name: Adam Hahn MRN: [id number] Date of Birth: Dec 10, 1965 Referring Provider (OT): Dr. Justice Britain   Encounter Date: 02/22/2019  OT End of Session - 02/22/19 1115    Visit Number  25    Number of Visits  35    Date for OT Re-Evaluation  03/01/19    Authorization Type  Patient is not filing with [employer] ($30 co pay- 60 visit limit). [facility] is only filing with [facility].    Authorization Time Period  [facility] approved additional 15 visits. Starting count back at 1 on 01/11/19    Authorization - Visit Number  10    Authorization - Number of Visits  15    OT Start Time  5284    OT Stop Time  1110    OT Time Calculation (min)  38 min    Activity Tolerance  Patient tolerated treatment well    Behavior During Therapy  WFL for tasks assessed/performed       Past Medical History:  Diagnosis Date  . Allergic rhinitis   . Arthritis    knees,  right shoulder ac joint  . History of exercise stress test 10-09-2001 in epic   normal  . HTN (hypertension)   . OSA on CPAP   . Shoulder impingement, right   . Type 2 diabetes mellitus ([place])    followed by pcp  . Wears glasses     Past Surgical History:  Procedure Laterality Date  . KNEE SURGERY Left x4  last one 1996  . LAPAROSCOPIC CHOLECYSTECTOMY  09-20-2007  '@AP'   . SHOULDER ARTHROSCOPY WITH ROTATOR CUFF REPAIR Right 10/17/2018   Procedure: Right shoulder arthroscopy with subacromial decompression, distal clavicle resection,  rotator cuff DEBRIEDMENT, LABRIAL DEBRIDMENT;  Surgeon: Justice Britain, MD;  Location: [facility];  Service: Orthopedics;  Laterality: Right;  155mn    There were no vitals filed for this visit.  Subjective Assessment - 02/22/19 1030    Subjective   S: I can definately tell it's being worked during the exercises.    Currently in Pain?  Yes    Pain Score  2     Pain Location  Shoulder    Pain Orientation  Right    Pain Descriptors / Indicators  Sore    Pain Type  Acute pain    Pain Radiating Towards  N/A    Pain Onset  Yesterday    Pain Frequency  Constant    Aggravating Factors   unsure    Pain Relieving Factors  rest, ice machine, therapy stretches    Effect of Pain on Daily Activities  no effect    Multiple Pain Sites  No         [facility] OT Assessment - 02/22/19 1029      Assessment   Medical Diagnosis  Right shoulder SA, SAD, DCR      Precautions   Precautions  Shoulder    Type of Shoulder Precautions  Progress as tolerated               OT Treatments/Exercises (OP) - 02/22/19 1038      Exercises   Exercises  Shoulder;Work HActuaryExercises: Supine   Protraction  PROM;5 reps    Horizontal ABduction  PROM;5 reps    External Rotation  PROM;5 reps  Internal Rotation  PROM;5 reps    Flexion  PROM;5 reps    ABduction  PROM;5 reps      Shoulder Exercises: Standing   External Rotation  Strengthening   bodycraft, 10# plate, shoulder flexed to 90   Internal Rotation  Strengthening;Other (comment)   bodycraft, 10# plate, shoulder abducted to 90   Other Standing Exercises  Overhead carry 2# for 2'    Other Standing Exercises  Proximal shoulder strengthening: writing alaphabet with RUE with 2# wrist weight.       Shoulder Exercises: ROM/Strengthening   Over Head Lace  2# bilateral wrist weights. seated with arms extended to 90 degrees.     Pushups  10 reps   wall push-ups   Other ROM/Strengthening Exercises  holding large green therapy ball with 2# wrist weights pt completed 20 repetitions of overhead press, chest press, completed 20" hold into chest press (flexed shoulder at 90) 3 times with min fatigue, held ball with shoulder flexed to 90 and completed 20 vertical pulses, horizontal pulses, left hand on top vertical pulses and left hand on bottom vertical  pulses, with minimal fatigue patient reports these exercises are a significant challenge for sustained activity tolerance for his right shoulder.      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 10X               OT Short Term Goals - 01/02/19 1229      OT SHORT TERM GOAL #1   Title  Patient will be educated and independent with HEP to increase functional use of his RUE be able to return to utilizing it as his dominant extremity for all daily tasks.    Time  6    Period  Weeks    Target Date  12/06/18      OT SHORT TERM GOAL #2   Title  Patient will increase RUE P/ROM to [facility] in order to increase ability to bath himself and wash his face without assistance.    Time  6    Period  Weeks      OT SHORT TERM GOAL #3   Title  Patient will increase RUE strength to 3/5 in order to complete waist level activities without difficulty.    Time  6    Period  Weeks      OT SHORT TERM GOAL #4   Title  Patient will decrease pain level to 6/10 or less during functional use and low level reaching tasks.    Time  6    Period  Weeks      OT SHORT TERM GOAL #5   Title  Patient will decrease fascial restrictions to mod amount or less in order to increase functional mobility needed in the RUE to complete low level reaching.    Time  6    Period  Weeks        OT Long Term Goals - 02/01/19 1040      OT LONG TERM GOAL #1   Title  Patient will return to highest level of independence using his RUE for all work and daily tasks while progressing as tolerated.    Time  12    Period  Weeks  Status  On-going      OT LONG TERM GOAL #2   Title  Patient will increase A/ROM of his RUE to [facility] in order to complete reaching tasks above his head with no  difficulty.    Time  12    Period  Weeks      OT LONG TERM GOAL #3   Title  Patient will increase his RUE strength to 4+/5 in order to return to work and begin his usual job requirements without restrictions.    Time  12    Period  Weeks    Status  Partially Met      OT LONG TERM GOAL #4   Title  Patient will decrease his fascial restrictions to min amount or less in order to increase functional mobility needed to complete reaching tasks.    Time  12    Period  Weeks      OT LONG TERM GOAL #5   Title  patient will decrease pain level in RUE to 3/10 or less during functional daily tasks.    Time  12    Period  Weeks    Status  On-going            Plan - 02/22/19 1116    Clinical Impression Statement  A: Continue focusing on shoulder and scapular strengthening as well as improving activity tolerance in preparation for return to to work. Occasional short rest breaks required during exercises, added wall push ups and overhead lifting using orange weighted ball. Verbal cuing for form and technique.    Body Structure / Function / Physical Skills  ADL;ROM;Scar mobility;IADL;Fascial restriction;Flexibility;Strength;Pain;UE functional use;GMC    Plan  P: Continue with work hardening tasks       Patient will benefit from skilled therapeutic intervention in order to improve the following deficits and impairments:   Body Structure / Function / Physical Skills: ADL, ROM, Scar mobility, IADL, Fascial restriction, Flexibility, Strength, Pain, UE functional use, GMC       Visit Diagnosis: Acute pain of right shoulder  Other symptoms and signs involving the musculoskeletal system    Problem List Patient Active Problem List   Diagnosis Date Noted  . Newly diagnosed diabetes ([place]) 01/13/2017  . Type 2 diabetes mellitus without complications ([place]) 73/66/8159  . Prediabetes 05/24/2016  . Patellar tendinitis of left knee 12/11/2013  . Morbid obesity ([place]) 05/09/2013  .  Osteoarthritis of left knee 11/09/2012  . Obstructive sleep apnea 09/11/2012  . HTN (hypertension) 09/11/2012  . Impaired fasting glucose 09/11/2012  . Degenerative arthritis of left knee 06/03/2011  . S/P lateral meniscectomy of left knee 06/03/2011  . BACK PAIN 05/01/2009  . DERANGEMENT MENISCUS 09/04/2007  . ELBOW PAIN, BILATERAL 04/06/2007  . DEGENERATIVE JOINT DISEASE, KNEE 03/13/2007  . Pain in joint, lower leg 03/13/2007   Guadelupe Sabin, OTR/L  [phone] 02/22/2019, 11:18 AM  [place] [address] [place], [place], [address] Phone: [phone]   Fax:  [phone]  Name: Adam Hahn MRN: [id number] Date of Birth: 1965-11-07

## 2019-02-27 ENCOUNTER — Ambulatory Visit (HOSPITAL_COMMUNITY): Payer: No Typology Code available for payment source | Admitting: Occupational Therapy

## 2019-02-27 ENCOUNTER — Other Ambulatory Visit: Payer: Self-pay

## 2019-02-27 ENCOUNTER — Encounter (HOSPITAL_COMMUNITY): Payer: Self-pay | Admitting: Occupational Therapy

## 2019-02-27 DIAGNOSIS — R29898 Other symptoms and signs involving the musculoskeletal system: Secondary | ICD-10-CM

## 2019-02-27 DIAGNOSIS — M25511 Pain in right shoulder: Secondary | ICD-10-CM | POA: Diagnosis not present

## 2019-02-27 NOTE — Patient Instructions (Signed)
This week 10/27-11/1  1) Wall stretch for shoulder flexion, 3x holding for 20 seconds  2) Range of motion exercises, can use 1lb weight   3) Ball exercises using a basketball (or any small ball)-only large motions. Flexion, chest press, overhead press, diagonals  4) Alternate heat and ice, end with ice  5) Ball massage   Next Week: 11/2  Stretches and Range of motion exercises alternating with strengthening every other day.   Bring back arms on fire and large therapy ball exercises slowly.   Alternate heat and ice, end with ice.

## 2019-02-27 NOTE — Therapy (Signed)
[place], [place], [address] Phone: [phone]   Fax:  [phone]  Occupational Therapy Reassessment & Treatment  Patient Details  Name: Adam Hahn MRN: [id number] Date of Birth: [birth year] Referring Provider (OT): Dr. Justice Britain   Encounter Date: 02/27/2019  OT End of Session - 02/27/19 1155    Visit Number  26    Number of Visits  35    Date for OT Re-Evaluation  03/01/19    Authorization Type  Patient is not filing with [employer] ($30 co pay- 60 visit limit). He is only filing with [facility].    Authorization Time Period  [facility] approved additional 15 visits. Starting count back at 1 on 01/11/19    Authorization - Visit Number  11    Authorization - Number of Visits  15    OT Start Time  1115    OT Stop Time  1151    OT Time Calculation (min)  36 min    Activity Tolerance  Patient tolerated treatment well    Behavior During Therapy  WFL for tasks assessed/performed       Past Medical History:  Diagnosis Date  . Allergic rhinitis   . Arthritis    knees,  right shoulder ac joint  . History of exercise stress test 10-09-2001 in epic   normal  . HTN (hypertension)   . OSA on CPAP   . Shoulder impingement, right   . Type 2 diabetes mellitus ([place])    followed by pcp  . Wears glasses     Past Surgical History:  Procedure Laterality Date  . KNEE SURGERY Left x4  last one 1996  . LAPAROSCOPIC CHOLECYSTECTOMY  09-20-2007  '@AP'   . SHOULDER ARTHROSCOPY WITH ROTATOR CUFF REPAIR Right 10/17/2018   Procedure: Right shoulder arthroscopy with subacromial decompression, distal clavicle resection,  rotator cuff DEBRIEDMENT, LABRIAL DEBRIDMENT;  Surgeon: Justice Britain, MD;  Location: [place];  Service: Orthopedics;  Laterality: Right;  139mn    There were no vitals filed for this visit.  Subjective Assessment - 02/27/19 1114    Subjective   S: It's been achy since the weekend.    Currently in  Pain?  Yes    Pain Score  6     Pain Location  Shoulder    Pain Orientation  Right    Pain Descriptors / Indicators  Sore    Pain Type  Acute pain    Pain Radiating Towards  N/A    Pain Onset  In the past 7 days    Pain Frequency  Intermittent    Aggravating Factors   unsure    Pain Relieving Factors  rest    Effect of Pain on Daily Activities  no effect    Multiple Pain Sites  No         [facility] OT Assessment - 02/27/19 1114      Assessment   Medical Diagnosis  Right shoulder SA, SAD, DCR      Precautions   Precautions  Shoulder    Type of Shoulder Precautions  Progress as tolerated      AROM   Overall AROM Comments  Assessed standing, er/IR adducted    Right Shoulder Flexion  166 Degrees   160 previous   Right Shoulder ABduction  165 Degrees   same as previous   Right Shoulder Internal Rotation  68 Degrees   65 previous   Right Shoulder External Rotation  70  Degrees   same as previous     PROM   Overall PROM Comments  Assessed supine. er abducted.     Right Shoulder Flexion  180 Degrees   same as previous   Right Shoulder ABduction  180 Degrees   same as previou   Right Shoulder Internal Rotation  90 Degrees   same as previous   Right Shoulder External Rotation  75 Degrees   65 previous     Strength   Overall Strength Comments  Assessed standing. IR/er adducted.     Right Shoulder Flexion  5/5   same as previous   Right Shoulder ABduction  5/5   same as previous   Right Shoulder Internal Rotation  5/5   same as previous   Right Shoulder External Rotation  5/5   same as previous              OT Treatments/Exercises (OP) - 02/27/19 1114      Exercises   Exercises  Shoulder;Work Actuary Exercises: Supine   Protraction  PROM;5 reps    Horizontal ABduction  PROM;5 reps    External Rotation  PROM;5 reps    Internal Rotation  PROM;5 reps    Flexion  PROM;5 reps    ABduction  PROM;5 reps      Shoulder Exercises: Standing    Protraction  Strengthening;10 reps    Protraction Weight (lbs)  1    Horizontal ABduction  Strengthening;10 reps    Horizontal ABduction Weight (lbs)  1    External Rotation  Strengthening;10 reps;Other (comment)   abducted   External Rotation Weight (lbs)  1    Internal Rotation  Strengthening;10 reps;Other (comment)   abducted   Internal Rotation Weight (lbs)  1    Flexion  Strengthening;10 reps    Shoulder Flexion Weight (lbs)  1    ABduction  Strengthening;10 reps    Shoulder ABduction Weight (lbs)  1      Shoulder Exercises: ROM/Strengthening   Other ROM/Strengthening Exercises  holding basketball: chest press, overhead press, flexion, diagonals, 10X each      Manual Therapy   Manual Therapy  Myofascial release    Manual therapy comments  Completed separate from exercises.     Myofascial Release  myofascial release to anterior shoulder and surgical scar region to decrease fascial restrictions and soft tissue tightness.               OT Education - 02/27/19 1155    Education Details  Educated pt on progress and HEP for next 2 weeks, working on decreasing soreness and inflammation    Person(s) Educated  Patient    Methods  Explanation    Comprehension  Verbalized understanding       OT Short Term Goals - 01/02/19 1229      OT SHORT TERM GOAL #1   Title  Patient will be educated and independent with HEP to increase functional use of his RUE be able to return to utilizing it as his dominant extremity for all daily tasks.    Time  6    Period  Weeks    Target Date  12/06/18      OT SHORT TERM GOAL #2   Title  Patient will increase RUE P/ROM to [facility] in order to increase ability to bath himself and wash his face without assistance.    Time  6    Period  Weeks  OT SHORT TERM GOAL #3   Title  Patient will increase RUE strength to 3/5 in order to complete waist level activities without difficulty.    Time  6    Period  Weeks      OT SHORT TERM GOAL #4   Title   Patient will decrease pain level to 6/10 or less during functional use and low level reaching tasks.    Time  6    Period  Weeks      OT SHORT TERM GOAL #5   Title  Patient will decrease fascial restrictions to mod amount or less in order to increase functional mobility needed in the RUE to complete low level reaching.    Time  6    Period  Weeks        OT Long Term Goals - 02/27/19 1157      OT LONG TERM GOAL #1   Title  Patient will return to highest level of independence using his RUE for all work and daily tasks while progressing as tolerated.    Time  12    Period  Weeks    Status  On-going      OT LONG TERM GOAL #2   Title  Patient will increase A/ROM of his RUE to [facility] in order to complete reaching tasks above his head with no difficulty.    Time  12    Period  Weeks      OT LONG TERM GOAL #3   Title  Patient will increase his RUE strength to 4+/5 in order to return to work and begin his usual job requirements without restrictions.    Time  12    Period  Weeks    Status  Achieved      OT LONG TERM GOAL #4   Title  Patient will decrease his fascial restrictions to min amount or less in order to increase functional mobility needed to complete reaching tasks.    Time  12    Period  Weeks      OT LONG TERM GOAL #5   Title  patient will decrease pain level in RUE to 3/10 or less during functional daily tasks.    Time  12    Period  Weeks    Status  On-going            Plan - 02/27/19 1157    Clinical Impression Statement  A: Reassessment completed this session, pt has met all STGs and 3/5 LTGs. Pt reporting increasing soreness and discomfort over the weekend, OT palpated moderate muscle knot in anterior deltoid and shoulder regions. Manual therapy completed this session to address. Discussed HEP, pt completing shoulder strengthening and several proximal shoulder strengthening and stability exercises. Provided pt with HEP for next 2 weeks focusing on improving pain  and inflammation and gradually resuming strengthening HEP. Pt has good ROM and strength, is primarily focusing on sustained strength and activity tolerance required for work tasks.  Pt has appt with MD on 03/09/19 who will decide whether or not to release pt for work assessment. Pt is agreeable to being placed on hold until after MD appt and possible work assessment, will call with MD report.    Body Structure / Function / Physical Skills  ADL;ROM;Scar mobility;IADL;Fascial restriction;Flexibility;Strength;Pain;UE functional use;GMC    Plan  P: Hold pt until after MD appt/work assessment; possible discharge       Patient will benefit from skilled therapeutic intervention in order to improve the  following deficits and impairments:   Body Structure / Function / Physical Skills: ADL, ROM, Scar mobility, IADL, Fascial restriction, Flexibility, Strength, Pain, UE functional use, GMC       Visit Diagnosis: Acute pain of right shoulder  Other symptoms and signs involving the musculoskeletal system    Problem List Patient Active Problem List   Diagnosis Date Noted  . Newly diagnosed diabetes ([place]) 01/13/2017  . Type 2 diabetes mellitus without complications ([place]) 37/99/0940  . Prediabetes 05/24/2016  . Patellar tendinitis of left knee 12/11/2013  . Morbid obesity ([place]) 05/09/2013  . Osteoarthritis of left knee 11/09/2012  . Obstructive sleep apnea 09/11/2012  . HTN (hypertension) 09/11/2012  . Impaired fasting glucose 09/11/2012  . Degenerative arthritis of left knee 06/03/2011  . S/P lateral meniscectomy of left knee 06/03/2011  . BACK PAIN 05/01/2009  . DERANGEMENT MENISCUS 09/04/2007  . ELBOW PAIN, BILATERAL 04/06/2007  . DEGENERATIVE JOINT DISEASE, KNEE 03/13/2007  . Pain in joint, lower leg 03/13/2007   Guadelupe Sabin, OTR/L  [phone] 02/27/2019, 12:01 PM  [place] [address] [place], [place], [address] Phone: [phone]    Fax:  [phone]  Name: Adam Hahn MRN: [id number] Date of Birth: 1965-07-07

## 2019-03-01 ENCOUNTER — Encounter (HOSPITAL_COMMUNITY): Payer: BC Managed Care – PPO

## 2019-04-02 ENCOUNTER — Encounter (HOSPITAL_COMMUNITY): Payer: Self-pay

## 2019-04-02 NOTE — Therapy (Signed)
[place], [place], [address] Phone: [phone]   Fax:  [phone]  Patient Details  Name: Adam Hahn MRN: [id number] Date of Birth: 17-Sep-1965 Referring Provider:  No ref. provider found  Encounter Date: 04/02/2019  OCCUPATIONAL THERAPY DISCHARGE SUMMARY  Visits from Start of Care: 26  Current functional level related to goals / functional outcomes:  OT LONG TERM GOAL #1   Title  Patient will return to highest level of independence using his RUE for all work and daily tasks while progressing as tolerated.    Time  12    Period  Weeks    Status  On-going        OT LONG TERM GOAL #2   Title  Patient will increase A/ROM of his RUE to [facility] in order to complete reaching tasks above his head with no difficulty.    Time  12    Period  Weeks        OT LONG TERM GOAL #3   Title  Patient will increase his RUE strength to 4+/5 in order to return to work and begin his usual job requirements without restrictions.    Time  12    Period  Weeks    Status  Achieved        OT LONG TERM GOAL #4   Title  Patient will decrease his fascial restrictions to min amount or less in order to increase functional mobility needed to complete reaching tasks.    Time  12    Period  Weeks        OT LONG TERM GOAL #5   Title  patient will decrease pain level in RUE to 3/10 or less during functional daily tasks.    Time  12    Period  Weeks    Status  On-going         Remaining deficits: Provided pt with HEP for next 2 weeks focusing on improving pain and inflammation and gradually resuming strengthening HEP. Pt has good ROM and strength, is primarily focusing on sustained strength and activity tolerance required for work tasks.   Education / Equipment: Education Details  Educated pt on progress and HEP for next 2 weeks, working on decreasing soreness and inflammation     Person(s) Educated   Patient    Methods  Explanation    Comprehension  Verbalized understanding     Plan: Patient agrees to discharge.  Patient goals were partially met. Patient is being discharged due to meeting the stated rehab goals.  ?????          Ailene Ravel, OTR/L,CBIS  [phone]  04/02/2019, 8:22 AM  [place], [place], [address] Phone: [phone]   Fax:  [phone]

## 2019-04-06 ENCOUNTER — Telehealth: Payer: Self-pay | Admitting: Family Medicine

## 2019-04-06 DIAGNOSIS — E119 Type 2 diabetes mellitus without complications: Secondary | ICD-10-CM

## 2019-04-06 DIAGNOSIS — I10 Essential (primary) hypertension: Secondary | ICD-10-CM

## 2019-04-06 DIAGNOSIS — Z125 Encounter for screening for malignant neoplasm of prostate: Secondary | ICD-10-CM

## 2019-04-06 DIAGNOSIS — Z79899 Other long term (current) drug therapy: Secondary | ICD-10-CM

## 2019-04-06 NOTE — Telephone Encounter (Signed)
Last labs 05/2018: Lipid, Liver, Met 7, HgbA1c, PSA

## 2019-04-06 NOTE — Telephone Encounter (Signed)
Pt has CPE scheduled 2/2. He would like to have lab work done before appt.

## 2019-04-08 NOTE — Telephone Encounter (Signed)
same

## 2019-04-09 NOTE — Telephone Encounter (Signed)
Blood work ordered in Epic. Patient notified. 

## 2019-05-16 DIAGNOSIS — M542 Cervicalgia: Secondary | ICD-10-CM | POA: Diagnosis not present

## 2019-05-16 DIAGNOSIS — M9901 Segmental and somatic dysfunction of cervical region: Secondary | ICD-10-CM | POA: Diagnosis not present

## 2019-05-16 DIAGNOSIS — M9903 Segmental and somatic dysfunction of lumbar region: Secondary | ICD-10-CM | POA: Diagnosis not present

## 2019-05-16 DIAGNOSIS — M545 Low back pain: Secondary | ICD-10-CM | POA: Diagnosis not present

## 2019-05-25 DIAGNOSIS — Z79899 Other long term (current) drug therapy: Secondary | ICD-10-CM | POA: Diagnosis not present

## 2019-05-25 DIAGNOSIS — Z125 Encounter for screening for malignant neoplasm of prostate: Secondary | ICD-10-CM | POA: Diagnosis not present

## 2019-05-25 DIAGNOSIS — E119 Type 2 diabetes mellitus without complications: Secondary | ICD-10-CM | POA: Diagnosis not present

## 2019-05-25 DIAGNOSIS — I10 Essential (primary) hypertension: Secondary | ICD-10-CM | POA: Diagnosis not present

## 2019-05-26 LAB — LIPID PANEL
Chol/HDL Ratio: 3.3 ratio (ref 0.0–5.0)
Cholesterol, Total: 148 mg/dL (ref 100–199)
HDL: 45 mg/dL (ref >39)
LDL Chol Calc (NIH): 80 mg/dL (ref 0–99)
Triglycerides: 129 mg/dL (ref 0–149)
VLDL Cholesterol Cal: 23 mg/dL (ref 5–40)

## 2019-05-26 LAB — HEPATIC FUNCTION PANEL
ALT: 42 IU/L (ref 0–44)
AST: 28 IU/L (ref 0–40)
Albumin: 4.8 g/dL (ref 3.8–4.9)
Alkaline Phosphatase: 54 IU/L (ref 39–117)
Bilirubin Total: 0.5 mg/dL (ref 0.0–1.2)
Bilirubin, Direct: 0.18 mg/dL (ref 0.00–0.40)
Total Protein: 7.4 g/dL (ref 6.0–8.5)

## 2019-05-26 LAB — BASIC METABOLIC PANEL
BUN/Creatinine Ratio: 9 (ref 9–20)
BUN: 11 mg/dL (ref 6–24)
CO2: 22 mmol/L (ref 20–29)
Calcium: 10.1 mg/dL (ref 8.7–10.2)
Chloride: 104 mmol/L (ref 96–106)
Creatinine, Ser: 1.16 mg/dL (ref 0.76–1.27)
GFR calc Af Amer: 84 mL/min/{1.73_m2} (ref >59)
GFR calc non Af Amer: 73 mL/min/{1.73_m2} (ref >59)
Glucose: 113 mg/dL — ABNORMAL HIGH (ref 65–99)
Potassium: 4.5 mmol/L (ref 3.5–5.2)
Sodium: 142 mmol/L (ref 134–144)

## 2019-05-26 LAB — PSA: Prostate Specific Ag, Serum: 0.2 ng/mL (ref 0.0–4.0)

## 2019-05-26 LAB — HEMOGLOBIN A1C
Est. average glucose Bld gHb Est-mCnc: 140 mg/dL
Hgb A1c MFr Bld: 6.5 % — ABNORMAL HIGH (ref 4.8–5.6)

## 2019-06-05 ENCOUNTER — Ambulatory Visit (INDEPENDENT_AMBULATORY_CARE_PROVIDER_SITE_OTHER): Payer: BC Managed Care – PPO | Admitting: Family Medicine

## 2019-06-05 ENCOUNTER — Other Ambulatory Visit: Payer: Self-pay

## 2019-06-05 VITALS — BP 122/82 | Temp 97.8°F | Ht 72.5 in | Wt 302.2 lb

## 2019-06-05 DIAGNOSIS — E119 Type 2 diabetes mellitus without complications: Secondary | ICD-10-CM

## 2019-06-05 DIAGNOSIS — I10 Essential (primary) hypertension: Secondary | ICD-10-CM

## 2019-06-05 DIAGNOSIS — Z0000 Encounter for general adult medical examination without abnormal findings: Secondary | ICD-10-CM

## 2019-06-05 MED ORDER — VERAPAMIL HCL ER 240 MG PO TBCR: 240.0000 mg | EXTENDED_RELEASE_TABLET | Freq: Every day | ORAL | 5 refills | Status: DC

## 2019-06-05 MED ORDER — METFORMIN HCL 1000 MG PO TABS: 1000.0000 mg | ORAL_TABLET | Freq: Two times a day (BID) | ORAL | 1 refills | Status: DC

## 2019-06-05 MED ORDER — CHOLESTYRAMINE 4 GM/DOSE PO POWD: ORAL | 5 refills | Status: DC

## 2019-06-05 NOTE — Progress Notes (Signed)
Subjective:    Patient ID: Adam Hahn, male    DOB: 1966-02-28, 54 y.o.   MRN: [id number]  HPI  The patient comes in today for a wellness visit.  Prostate ca heavy in the family  A review of their health history was completed.  A review of medications was also completed.  Any needed refills; yes  Eating habits: eating good  Falls/  MVA accidents in past few months: none  Regular exercise: 2-3 times a week  Specialist pt sees on regular basis: none  Preventative health issues were discussed.   Additional concerns: none  Results for orders placed or performed in visit on 04/06/19  Lipid Profile  Result Value Ref Range   Cholesterol, Total 148 100 - 199 mg/dL   Triglycerides 129 0 - 149 mg/dL   HDL 45 >39 mg/dL   VLDL Cholesterol Cal 23 5 - 40 mg/dL   LDL Chol Calc (NIH) 80 0 - 99 mg/dL   Chol/HDL Ratio 3.3 0.0 - 5.0 ratio  Hepatic function panel  Result Value Ref Range   Total Protein 7.4 6.0 - 8.5 g/dL   Albumin 4.8 3.8 - 4.9 g/dL   Bilirubin Total 0.5 0.0 - 1.2 mg/dL   Bilirubin, Direct 0.18 0.00 - 0.40 mg/dL   Alkaline Phosphatase 54 39 - 117 IU/L   AST 28 0 - 40 IU/L   ALT 42 0 - 44 IU/L  Basic Metabolic Panel (BMET)  Result Value Ref Range   Glucose 113 (H) 65 - 99 mg/dL   BUN 11 6 - 24 mg/dL   Creatinine, Ser 1.16 0.76 - 1.27 mg/dL   GFR calc non Af Amer 73 >59 mL/min/1.73   GFR calc Af Amer 84 >59 mL/min/1.73   BUN/Creatinine Ratio 9 9 - 20   Sodium 142 134 - 144 mmol/L   Potassium 4.5 3.5 - 5.2 mmol/L   Chloride 104 96 - 106 mmol/L   CO2 22 20 - 29 mmol/L   Calcium 10.1 8.7 - 10.2 mg/dL  Hemoglobin A1c  Result Value Ref Range   Hgb A1c MFr Bld 6.5 (H) 4.8 - 5.6 %   Est. average glucose Bld gHb Est-mCnc 140 mg/dL  PSA  Result Value Ref Range   Prostate Specific Ag, Serum 0.2 0.0 - 4.0 ng/mL   Patient claims compliance with diabetes medication. No obvious side effects. Reports no substantial low sugar spells. Most numbers are generally  in good range when checked fasting. Generally does not miss a dose of medication. Watching diabetic diet closely  working on [employer] now   [employer] no  diab retinopathy  Blood pressure medicine and blood pressure levels reviewed today with patient. Compliant with blood pressure medicine. States does not miss a dose. No obvious side effects. Blood pressure generally good when checked elsewhere. Watching salt intake.    Review of Systems  Constitutional: Negative for activity change, appetite change and fever.  HENT: Negative for congestion and rhinorrhea.   Eyes: Negative for discharge.  Respiratory: Negative for cough and wheezing.   Cardiovascular: Negative for chest pain.  Gastrointestinal: Negative for abdominal pain, blood in stool and vomiting.  Genitourinary: Negative for difficulty urinating and frequency.  Musculoskeletal: Negative for neck pain.  Skin: Negative for rash.  Allergic/Immunologic: Negative for environmental allergies and food allergies.  Neurological: Negative for weakness and headaches.  Psychiatric/Behavioral: Negative for agitation.       Objective:   Physical Exam Vitals reviewed.  Constitutional:  Appearance: He is well-developed.  HENT:     Head: Normocephalic and atraumatic.     Right Ear: External ear normal.     Left Ear: External ear normal.     Nose: Nose normal.  Eyes:     Pupils: Pupils are equal, round, and reactive to light.  Neck:     Thyroid: No thyromegaly.  Cardiovascular:     Rate and Rhythm: Normal rate and regular rhythm.     Heart sounds: Normal heart sounds. No murmur.  Pulmonary:     Effort: Pulmonary effort is normal. No respiratory distress.     Breath sounds: Normal breath sounds. No wheezing.  Abdominal:     General: Bowel sounds are normal. There is no distension.     Palpations: Abdomen is soft. There is no mass.     Tenderness: There is no abdominal tenderness.  Genitourinary:    Penis: Normal.     Musculoskeletal:        General: Normal range of motion.     Cervical back: Normal range of motion and neck supple.  Lymphadenopathy:     Cervical: No cervical adenopathy.  Skin:    General: Skin is warm and dry.     Findings: No erythema.  Neurological:     Mental Status: He is alert.     Motor: No abnormal muscle tone.  Psychiatric:        Behavior: Behavior normal.        Judgment: Judgment normal.     Prostate within normal limits  Diabetic foot exam sensation intact pulses good    Assessment & Plan:  Impression wellness exam.  Diet discussed.  Exercise discussed.  Up-to-date on vaccines.  Covid vaccine discussed.  Patient due to get a colonoscopy this year.  2.  Hypertension good control discussed maintain same meds  3.  Type 2 diabetes.  A1c excellent diet discussed compliance with interventions discussed  4.  Morbid obesity ongoing patient to work harder on diet  5.  Statin guidelines discussed.  Patient declines further medication at this time  Follow-up in 6 months medications refilled blood work reviewed follow-up in 6 months

## 2019-06-06 ENCOUNTER — Encounter: Payer: Self-pay | Admitting: Family Medicine

## 2019-08-07 ENCOUNTER — Encounter: Payer: Self-pay | Admitting: Family Medicine

## 2019-08-07 ENCOUNTER — Telehealth: Payer: Self-pay | Admitting: *Deleted

## 2019-08-07 NOTE — Telephone Encounter (Signed)
Pt requesting work note for march 28th. Stayed home from work because he got some bad news about a family member having cancer and his bp was elevated. States he is feeling fine now and bp is back to normal.

## 2019-08-07 NOTE — Telephone Encounter (Signed)
Please give work excuse for march 28th and give to me and I will take to pt's wife. thanks

## 2019-08-07 NOTE — Telephone Encounter (Signed)
ok 

## 2019-08-09 DIAGNOSIS — L0291 Cutaneous abscess, unspecified: Secondary | ICD-10-CM | POA: Diagnosis not present

## 2019-08-13 DIAGNOSIS — H469 Unspecified optic neuritis: Secondary | ICD-10-CM | POA: Diagnosis not present

## 2019-08-13 DIAGNOSIS — H539 Unspecified visual disturbance: Secondary | ICD-10-CM | POA: Diagnosis not present

## 2019-12-17 DIAGNOSIS — M545 Low back pain: Secondary | ICD-10-CM | POA: Diagnosis not present

## 2019-12-17 DIAGNOSIS — M546 Pain in thoracic spine: Secondary | ICD-10-CM | POA: Diagnosis not present

## 2019-12-17 DIAGNOSIS — M9902 Segmental and somatic dysfunction of thoracic region: Secondary | ICD-10-CM | POA: Diagnosis not present

## 2019-12-17 DIAGNOSIS — M9903 Segmental and somatic dysfunction of lumbar region: Secondary | ICD-10-CM | POA: Diagnosis not present

## 2019-12-21 ENCOUNTER — Ambulatory Visit: Payer: Self-pay

## 2019-12-21 ENCOUNTER — Ambulatory Visit
Admission: EM | Admit: 2019-12-21 | Discharge: 2019-12-21 | Disposition: A | Discharge status: Home / Self Care | Payer: BC Managed Care – PPO | Attending: Family Medicine | Admitting: Family Medicine

## 2019-12-21 ENCOUNTER — Encounter: Payer: Self-pay | Admitting: Emergency Medicine

## 2019-12-21 DIAGNOSIS — Z20822 Contact with and (suspected) exposure to covid-19: Secondary | ICD-10-CM

## 2019-12-21 NOTE — Discharge Instructions (Signed)

## 2019-12-21 NOTE — ED Triage Notes (Signed)
Pt's wife dx with covid recently. Pt having no s/s would like test.

## 2019-12-22 LAB — NOVEL CORONAVIRUS, NAA: SARS-CoV-2, NAA: NOT DETECTED

## 2019-12-22 LAB — SARS-COV-2, NAA 2 DAY TAT

## 2020-01-23 DIAGNOSIS — M9902 Segmental and somatic dysfunction of thoracic region: Secondary | ICD-10-CM | POA: Diagnosis not present

## 2020-01-23 DIAGNOSIS — M542 Cervicalgia: Secondary | ICD-10-CM | POA: Diagnosis not present

## 2020-01-23 DIAGNOSIS — M546 Pain in thoracic spine: Secondary | ICD-10-CM | POA: Diagnosis not present

## 2020-01-23 DIAGNOSIS — M9901 Segmental and somatic dysfunction of cervical region: Secondary | ICD-10-CM | POA: Diagnosis not present

## 2020-02-11 DIAGNOSIS — M9902 Segmental and somatic dysfunction of thoracic region: Secondary | ICD-10-CM | POA: Diagnosis not present

## 2020-02-11 DIAGNOSIS — M9901 Segmental and somatic dysfunction of cervical region: Secondary | ICD-10-CM | POA: Diagnosis not present

## 2020-02-11 DIAGNOSIS — M542 Cervicalgia: Secondary | ICD-10-CM | POA: Diagnosis not present

## 2020-02-11 DIAGNOSIS — M546 Pain in thoracic spine: Secondary | ICD-10-CM | POA: Diagnosis not present

## 2020-02-15 DIAGNOSIS — M9902 Segmental and somatic dysfunction of thoracic region: Secondary | ICD-10-CM | POA: Diagnosis not present

## 2020-02-15 DIAGNOSIS — M546 Pain in thoracic spine: Secondary | ICD-10-CM | POA: Diagnosis not present

## 2020-02-15 DIAGNOSIS — M9901 Segmental and somatic dysfunction of cervical region: Secondary | ICD-10-CM | POA: Diagnosis not present

## 2020-02-15 DIAGNOSIS — M542 Cervicalgia: Secondary | ICD-10-CM | POA: Diagnosis not present

## 2020-02-29 ENCOUNTER — Encounter: Payer: Self-pay | Admitting: Family Medicine

## 2020-02-29 ENCOUNTER — Ambulatory Visit (INDEPENDENT_AMBULATORY_CARE_PROVIDER_SITE_OTHER): Payer: BC Managed Care – PPO | Admitting: Family Medicine

## 2020-02-29 ENCOUNTER — Other Ambulatory Visit: Payer: Self-pay

## 2020-02-29 VITALS — HR 88 | Temp 97.3°F | Resp 16

## 2020-02-29 DIAGNOSIS — J019 Acute sinusitis, unspecified: Secondary | ICD-10-CM | POA: Diagnosis not present

## 2020-02-29 MED ORDER — AZITHROMYCIN 250 MG PO TABS: ORAL_TABLET | ORAL | 0 refills | Status: DC

## 2020-02-29 NOTE — Progress Notes (Signed)
Patient ID: Adam Hahn, male    DOB: August 24, 1965, 54 y.o.   MRN: [id number]   Chief Complaint  Patient presents with  . Sinus Problem    since Tuesday- heavy nasal conjestion and mucus-No fever   Subjective:  CC: "sinus infection"  Presents via outside visit today with the complaint of "sinus infection".  Symptoms started on Tuesday, with head congestion mucus with blood-streaked.  Pertinent negatives include no fever, no chills, no fatigue, no headache, no shortness of breath, no sore throat, no abdominal pain, no urinary symptoms.  He does report a slight cough, and head congestion.  He usually gets this around this time every year.  No sick contacts, wishes to be tested for Covid today.    Medical History Adam Hahn has a past medical history of Allergic rhinitis, Arthritis, History of exercise stress test (10-09-2001 in epic), HTN (hypertension), OSA on CPAP, Shoulder impingement, right, Type 2 diabetes mellitus ([place]), and Wears glasses.   Outpatient Encounter Medications as of 02/29/2020  Medication Sig  . azithromycin (ZITHROMAX) 250 MG tablet Take 2 tablets by mouth today, then one tablet by mouth for 4 days  . blood glucose meter kit and supplies KIT Dispense based on patient and insurance preference. Test BG once daily.(FOR ICD-10 E11.9)  . cholestyramine (QUESTRAN) 4 GM/DOSE powder USE 1/2 SCOOP TWICE DAILY AS DIRECTED.  Marland Kitchen ergocalciferol (VITAMIN D2) 1.25 MG (50000 UT) capsule Take 50,000 Units by mouth once a week.  . metFORMIN (GLUCOPHAGE) 1000 MG tablet Take 1 tablet (1,000 mg total) by mouth 2 (two) times daily with a meal.  . methocarbamol (ROBAXIN) 500 MG tablet Take 1 tablet (500 mg total) by mouth 3 (three) times daily as needed for muscle spasms.  . naproxen (NAPROSYN) 500 MG tablet Take 1 tablet (500 mg total) by mouth 2 (two) times daily with a meal.  . verapamil (CALAN-SR) 240 MG CR tablet Take 1 tablet (240 mg total) by mouth daily.   No facility-administered  encounter medications on file as of 02/29/2020.     Review of Systems  Constitutional: Negative for chills and fever.  HENT: Positive for congestion and postnasal drip. Negative for sore throat.   Respiratory: Positive for cough.   Gastrointestinal: Negative for abdominal pain.  Genitourinary: Negative for dysuria.  Hematological: Positive for adenopathy.     Vitals Pulse 88   Temp (!) 97.3 F (36.3 C)   Resp 16   SpO2 96%   Objective:   Physical Exam Vitals and nursing note reviewed.  Constitutional:      General: He is not in acute distress.    Appearance: Normal appearance. He is ill-appearing. He is not toxic-appearing.  HENT:     Right Ear: Tympanic membrane normal.     Left Ear: Tympanic membrane normal.     Nose: Congestion present.     Right Turbinates: Swollen.     Left Turbinates: Swollen.     Right Sinus: Maxillary sinus tenderness present.     Left Sinus: Maxillary sinus tenderness present.     Mouth/Throat:     Mouth: Mucous membranes are moist.     Pharynx: Oropharynx is clear.  Cardiovascular:     Rate and Rhythm: Normal rate and regular rhythm.     Heart sounds: Normal heart sounds.  Pulmonary:     Effort: Pulmonary effort is normal.     Breath sounds: Normal breath sounds.  Skin:    General: Skin is warm and dry.  Neurological:  Mental Status: He is alert and oriented to person, place, and time.  Psychiatric:        Mood and Affect: Mood normal.        Behavior: Behavior normal.        Thought Content: Thought content normal.        Judgment: Judgment normal.      Assessment and Plan   1. Acute rhinosinusitis - azithromycin (ZITHROMAX) 250 MG tablet; Take 2 tablets by mouth today, then one tablet by mouth for 4 days  Dispense: 6 tablet; Refill: 0 - Novel Coronavirus, NAA ([facility])   Will treat for acute rhinosinusitis, he had slight facial tenderness in the maxillary area, this is a common occurrence for him this time of the year.   We will treat with a Z-Pak.  Agrees with plan of care discussed today. Understands warning signs to seek further care: Fever, chills, shortness of breath, chest pain, not improving. Understands to follow-up if symptoms do not improve.  Will notify once Covid results are available, can also review on my chart.

## 2020-03-01 LAB — SARS-COV-2, NAA 2 DAY TAT

## 2020-03-01 LAB — NOVEL CORONAVIRUS, NAA: SARS-CoV-2, NAA: NOT DETECTED

## 2020-04-10 ENCOUNTER — Encounter (INDEPENDENT_AMBULATORY_CARE_PROVIDER_SITE_OTHER): Payer: Self-pay | Admitting: *Deleted

## 2020-04-10 ENCOUNTER — Ambulatory Visit: Payer: BC Managed Care – PPO | Admitting: Family Medicine

## 2020-04-10 ENCOUNTER — Other Ambulatory Visit: Payer: Self-pay

## 2020-04-10 ENCOUNTER — Encounter: Payer: Self-pay | Admitting: Family Medicine

## 2020-04-10 VITALS — BP 132/84 | HR 95 | Temp 97.2°F | Ht 72.5 in | Wt 283.0 lb

## 2020-04-10 DIAGNOSIS — K529 Noninfective gastroenteritis and colitis, unspecified: Secondary | ICD-10-CM

## 2020-04-10 DIAGNOSIS — Z23 Encounter for immunization: Secondary | ICD-10-CM | POA: Diagnosis not present

## 2020-04-10 DIAGNOSIS — I10 Essential (primary) hypertension: Secondary | ICD-10-CM | POA: Diagnosis not present

## 2020-04-10 DIAGNOSIS — E119 Type 2 diabetes mellitus without complications: Secondary | ICD-10-CM

## 2020-04-10 DIAGNOSIS — Z1211 Encounter for screening for malignant neoplasm of colon: Secondary | ICD-10-CM

## 2020-04-10 LAB — POCT GLYCOSYLATED HEMOGLOBIN (HGB A1C): Hemoglobin A1C: 5 % (ref 4.0–5.6)

## 2020-04-10 MED ORDER — CHOLESTYRAMINE 4 GM/DOSE PO POWD: ORAL | 5 refills | Status: DC

## 2020-04-10 MED ORDER — GLIPIZIDE 5 MG PO TABS: 5.0000 mg | ORAL_TABLET | Freq: Two times a day (BID) | ORAL | 0 refills | Status: DC

## 2020-04-10 NOTE — Progress Notes (Signed)
Patient ID: Adam Hahn, male    DOB: 07/23/1965, 54 y.o.   MRN: [id number]   Chief Complaint  Patient presents with  . Diabetes    FOLLOW UP   Subjective:    HPI Pt seen to establish care and f/u on Diabetes. Pt seeing the [facility] and they are checking labs and also adjusting bp and diabetic meds.  [facility]   We fill the cholestryramine. Using this since his cholecystectomy.  Doing his yearly physical here and the sick visits.   [facility]- seen in 1/21. No retinopathy per pt. [facility]. Hasn't had colonscopy since turning 52. Needing to schedule.   Had covid vaccine.   Wanting the flu vaccine today.  Foot exam- dr. Richardson Landry.  No neuropathy in past. Saw neurology 2 months ago and foot exam.  In mychart will forward the labs from the [place].   Medical History Adam Hahn has a past medical history of Allergic rhinitis, Arthritis, History of exercise stress test (10-09-2001 in epic), HTN (hypertension), OSA on CPAP, Shoulder impingement, right, Type 2 diabetes mellitus ([place]), and Wears glasses.   Outpatient Encounter Medications as of 04/10/2020  Medication Sig  . cholestyramine (QUESTRAN) 4 GM/DOSE powder USE 1/2 SCOOP TWICE DAILY AS DIRECTED.  Marland Kitchen glipiZIDE (GLUCOTROL) 5 MG tablet Take 1 tablet (5 mg total) by mouth 2 (two) times daily before a meal. Getting meds from [place].  . metFORMIN (GLUCOPHAGE) 1000 MG tablet Take 1 tablet (1,000 mg total) by mouth 2 (two) times daily with a meal.  . verapamil (CALAN-SR) 240 MG CR tablet Take 1 tablet (240 mg total) by mouth daily.  . [DISCONTINUED] azithromycin (ZITHROMAX) 250 MG tablet Take 2 tablets by mouth today, then one tablet by mouth for 4 days  . [DISCONTINUED] blood glucose meter kit and supplies KIT Dispense based on patient and insurance preference. Test BG once daily.(FOR ICD-10 E11.9)  . [DISCONTINUED] cholestyramine (QUESTRAN) 4 GM/DOSE powder USE 1/2  SCOOP TWICE DAILY AS DIRECTED.  . [DISCONTINUED] ergocalciferol (VITAMIN D2) 1.25 MG (50000 UT) capsule Take 50,000 Units by mouth once a week.  . [DISCONTINUED] methocarbamol (ROBAXIN) 500 MG tablet Take 1 tablet (500 mg total) by mouth 3 (three) times daily as needed for muscle spasms.  . [DISCONTINUED] naproxen (NAPROSYN) 500 MG tablet Take 1 tablet (500 mg total) by mouth 2 (two) times daily with a meal.   No facility-administered encounter medications on file as of 04/10/2020.     Review of Systems  Constitutional: Negative for chills and fever.  HENT: Negative for congestion, rhinorrhea and sore throat.   Respiratory: Negative for cough, shortness of breath and wheezing.   Cardiovascular: Negative for chest pain and leg swelling.  Gastrointestinal: Negative for abdominal pain, diarrhea, nausea and vomiting.  Genitourinary: Negative for dysuria and frequency.  Skin: Negative for rash.  Neurological: Negative for dizziness, weakness and headaches.     Vitals BP 132/84   Pulse 95   Temp (!) 97.2 F (36.2 C) (Oral)   Ht 6' 0.5" (1.842 m)   Wt 283 lb (128.4 kg)   SpO2 100%   BMI 37.85 kg/m   Objective:   Physical Exam Vitals and nursing note reviewed.  Constitutional:      General: He is not in acute distress.    Appearance: Normal appearance. He is obese. He is not ill-appearing.  HENT:     Head: Normocephalic.  Eyes:  Extraocular Movements: Extraocular movements intact.     Conjunctiva/sclera: Conjunctivae normal.     Pupils: Pupils are equal, round, and reactive to light.  Cardiovascular:     Rate and Rhythm: Normal rate and regular rhythm.     Pulses: Normal pulses.     Heart sounds: Normal heart sounds. No murmur heard.   Pulmonary:     Effort: Pulmonary effort is normal. No respiratory distress.     Breath sounds: Normal breath sounds. No wheezing, rhonchi or rales.  Musculoskeletal:        General: Normal range of motion.     Right lower leg: No  edema.     Left lower leg: No edema.  Skin:    General: Skin is warm and dry.     Findings: No rash.  Neurological:     General: No focal deficit present.     Mental Status: He is alert and oriented to person, place, and time.     Cranial Nerves: No cranial nerve deficit.  Psychiatric:        Mood and Affect: Mood normal.        Behavior: Behavior normal.        Thought Content: Thought content normal.        Judgment: Judgment normal.      Assessment and Plan   1. Type 2 diabetes mellitus without complication, without long-term current use of insulin (HCC) - POCT glycosylated hemoglobin (Hb A1C)  2. Screen for colon cancer - Ambulatory referral to Gastroenterology  3. Primary hypertension  4. Need for vaccination - Flu Vaccine QUAD 36+ mos IM  5. Chronic diarrhea   DM2- a1c is 5.0 today. Improved from last time at 6.5.  Cont metformin and glipizide.   htn- stable. Cont meds.  Pt getting diabetic and bp meds from [place].  Having labs checked there also.  Will send labs in mychart for [place].  Pt seeing diabetic eye exam in 1/22. [facility] and [facility] pt.  Chronic diarrhea- 2nd to cholecystectomy using cholestyramine. Stable.  [facility] [address] prn.

## 2020-04-20 IMAGING — DX DG CERVICAL SPINE COMPLETE 4+V
5 series · 5 of 5 positions shown · non-contrast
Comparison: None.

CLINICAL DATA: Right neck pain for 2 weeks.

EXAM:
CERVICAL SPINE - COMPLETE 4+ VIEW

[c-spine lat]
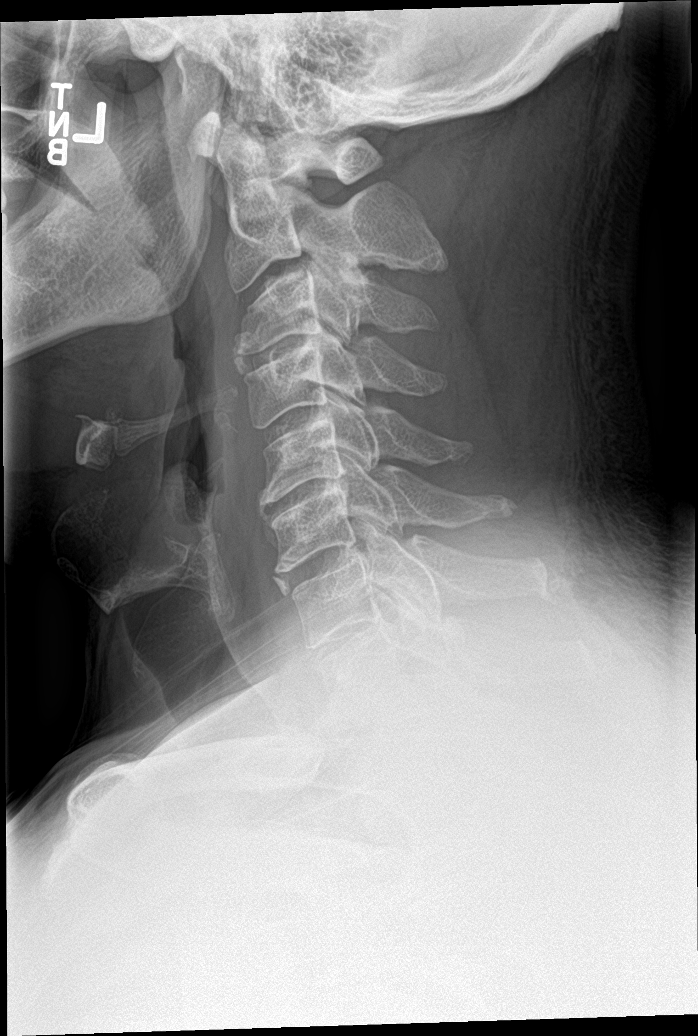

[c-spine obl (1 of 2)]
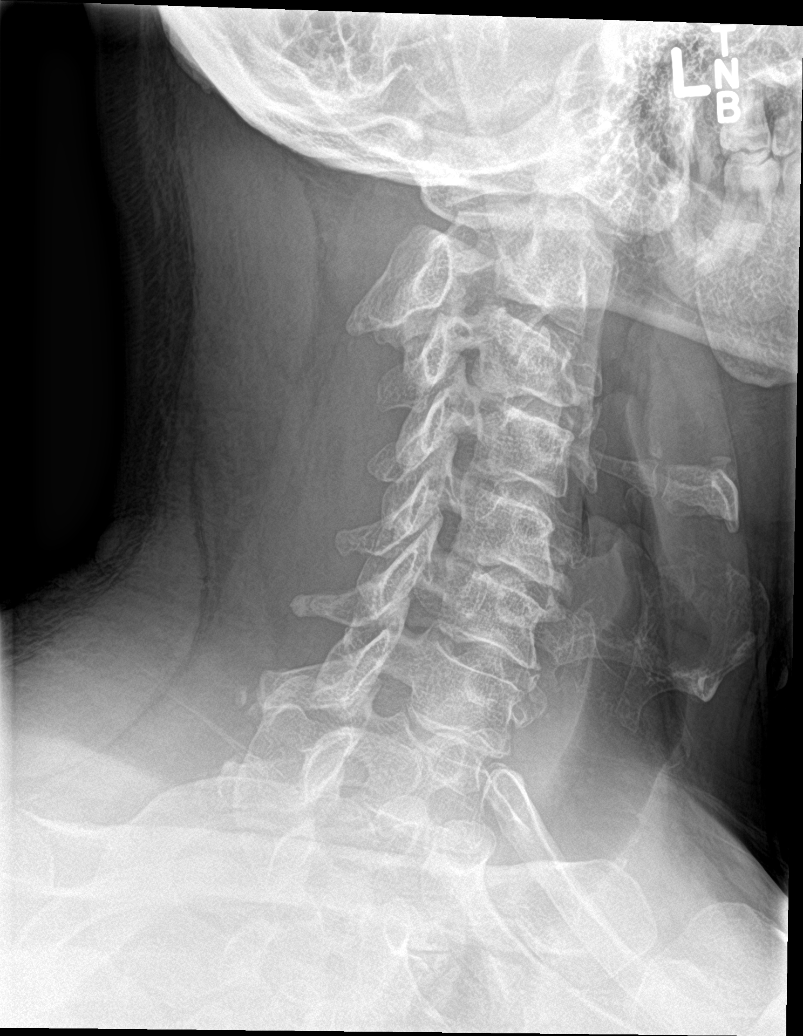

[c-spine obl (2 of 2)]
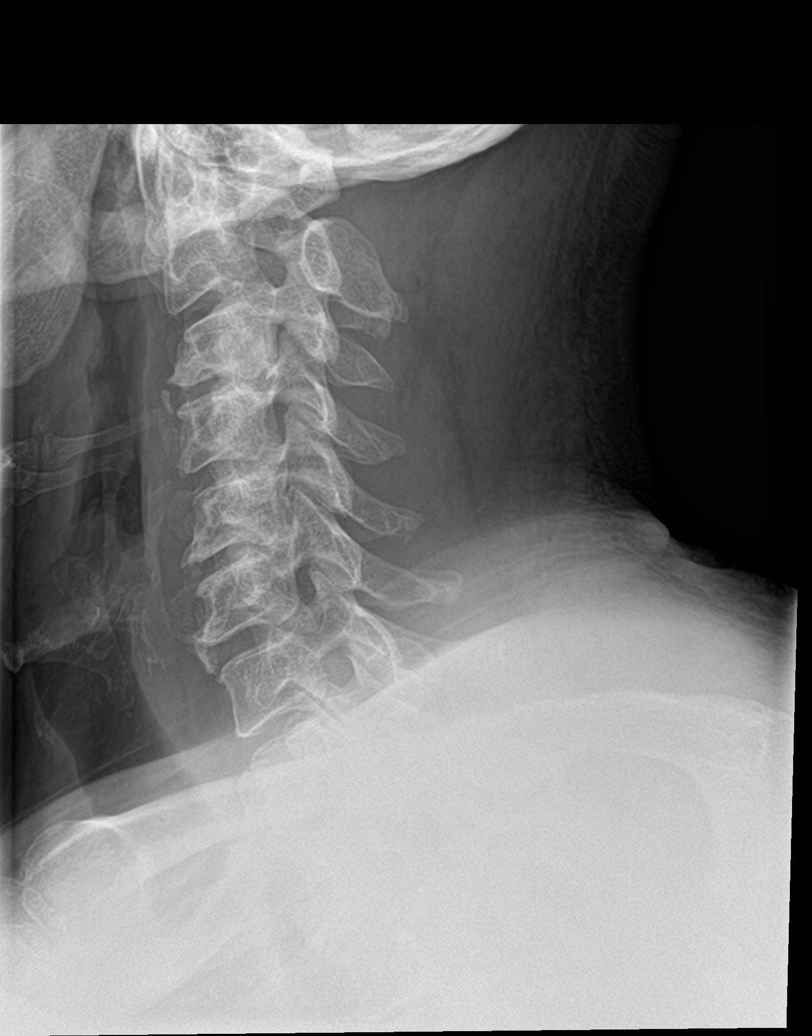

[c-spine ap]
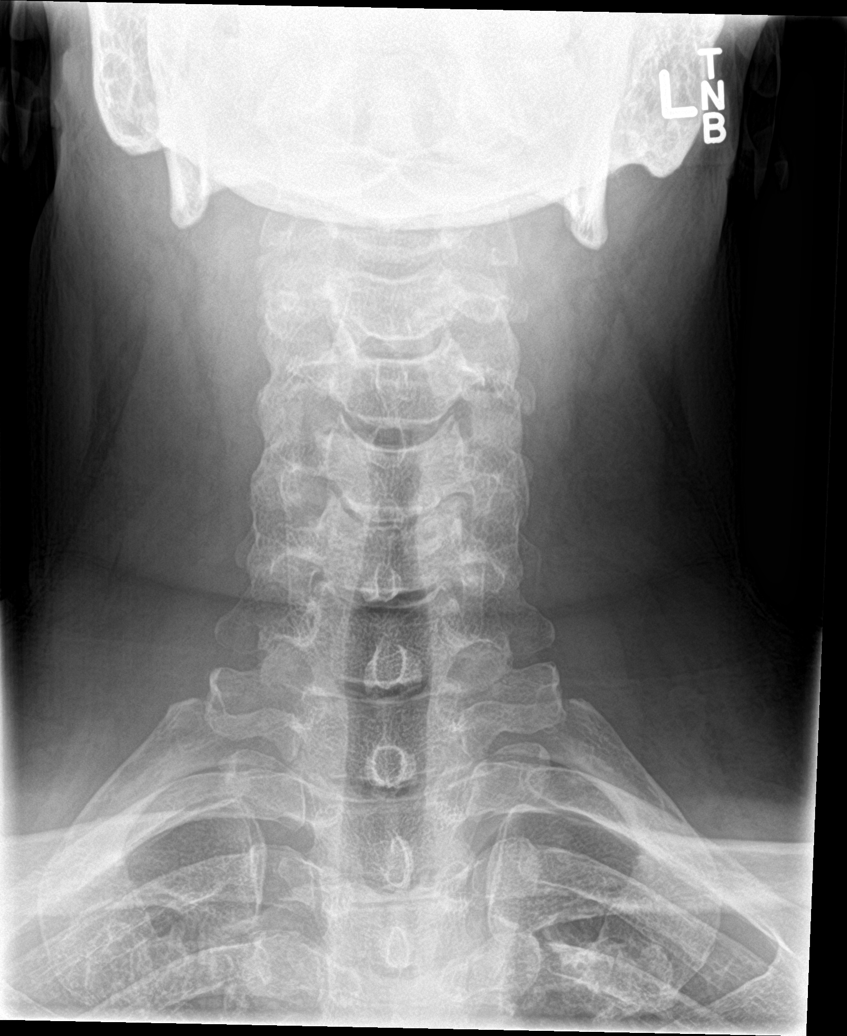

[c-spine open mouth]
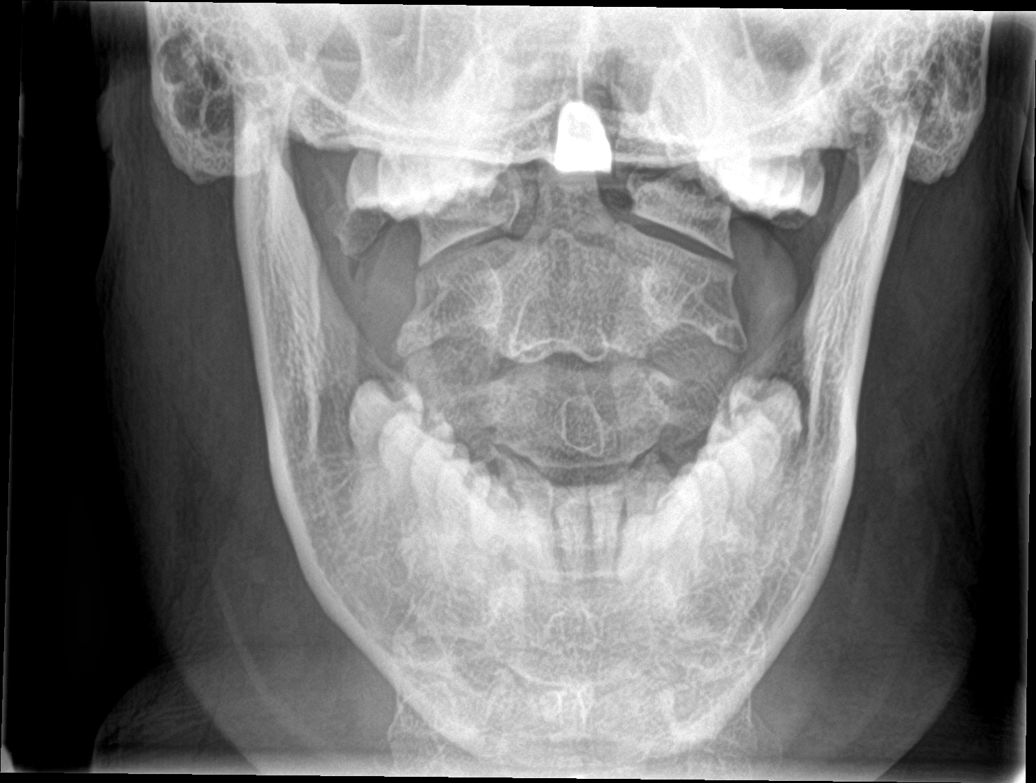

[5 of 5 positions shown; findings below may reference images not displayed]

FINDINGS: There is no evidence of cervical spine fracture or prevertebral soft
tissue swelling. Alignment is normal. Narrowing of the right C2-3,
left C3-4, C4-5, C5-6, C6-7 neural foramina due to osteophyte
encroachment are noted. There are anterior osteophytosis of the
upper to mid cervical spine.
IMPRESSION: No acute fracture or dislocation. Degenerative joint changes of
cervical spine.

## 2020-04-23 ENCOUNTER — Encounter: Payer: Self-pay | Admitting: Family Medicine

## 2020-05-03 HISTORY — PX: ROTATOR CUFF REPAIR: SHX139

## 2020-05-23 ENCOUNTER — Telehealth: Payer: Self-pay | Admitting: *Deleted

## 2020-05-23 DIAGNOSIS — I10 Essential (primary) hypertension: Secondary | ICD-10-CM

## 2020-05-23 DIAGNOSIS — Z13220 Encounter for screening for lipoid disorders: Secondary | ICD-10-CM

## 2020-05-23 DIAGNOSIS — Z0000 Encounter for general adult medical examination without abnormal findings: Secondary | ICD-10-CM

## 2020-05-23 DIAGNOSIS — Z125 Encounter for screening for malignant neoplasm of prostate: Secondary | ICD-10-CM

## 2020-05-23 DIAGNOSIS — E119 Type 2 diabetes mellitus without complications: Secondary | ICD-10-CM

## 2020-05-23 NOTE — Telephone Encounter (Signed)
See note below. thx

## 2020-05-23 NOTE — Telephone Encounter (Signed)
Pls order psa, a1c, cmp, lipids, thx. Dr. Lovena Le

## 2020-05-23 NOTE — Telephone Encounter (Signed)
Pt requesting bw for his physical next month. Last labs were 05/25/19 psa, a1c, bmp, lipid, liver

## 2020-05-23 NOTE — Telephone Encounter (Signed)
error 

## 2020-05-23 NOTE — Telephone Encounter (Signed)
Bw orders put in and pt was notified.  

## 2020-05-28 ENCOUNTER — Other Ambulatory Visit: Payer: Self-pay

## 2020-05-28 ENCOUNTER — Ambulatory Visit: Payer: BC Managed Care – PPO

## 2020-05-28 ENCOUNTER — Ambulatory Visit: Payer: BC Managed Care – PPO | Admitting: Orthopedic Surgery

## 2020-05-28 ENCOUNTER — Encounter: Payer: Self-pay | Admitting: Orthopedic Surgery

## 2020-05-28 VITALS — BP 139/88 | HR 85 | Ht 73.0 in | Wt 290.1 lb

## 2020-05-28 DIAGNOSIS — G8929 Other chronic pain: Secondary | ICD-10-CM | POA: Diagnosis not present

## 2020-05-28 DIAGNOSIS — Z0000 Encounter for general adult medical examination without abnormal findings: Secondary | ICD-10-CM | POA: Diagnosis not present

## 2020-05-28 DIAGNOSIS — Z13220 Encounter for screening for lipoid disorders: Secondary | ICD-10-CM | POA: Diagnosis not present

## 2020-05-28 DIAGNOSIS — I10 Essential (primary) hypertension: Secondary | ICD-10-CM | POA: Diagnosis not present

## 2020-05-28 DIAGNOSIS — Z125 Encounter for screening for malignant neoplasm of prostate: Secondary | ICD-10-CM | POA: Diagnosis not present

## 2020-05-28 DIAGNOSIS — M25562 Pain in left knee: Secondary | ICD-10-CM

## 2020-05-28 DIAGNOSIS — E119 Type 2 diabetes mellitus without complications: Secondary | ICD-10-CM | POA: Diagnosis not present

## 2020-05-28 NOTE — Patient Instructions (Signed)

## 2020-05-28 NOTE — Progress Notes (Signed)
Chief Complaint  Patient presents with  . Knee Pain    L/ doing good, just having sharp pain under kneecap.   55-year-old male status post 5 knee surgeries on his left knee was in his usual state of health until he was walking [place] felt a pop in his knee and for a few seconds thought he had reinjured the ligament however after several minutes he was able to get up and walk although his knee was swollen  He has been using ice ibuprofen and the swelling has gone down but now complains of pain in the patellofemoral joint which he describes as sharp but not associated with any giving way  He denies any numbness or tingling the joint swelling has resolved there is no rash over the right knee  Right knee exam shows a stable knee no effusion crepitance and patellofemoral pain with patellofemoral compression is normal strength neurovascular exam is intact  Is x-ray from today shows he has about 10 degrees of valgus in the knee narrowing of the lateral compartment although symmetric joint spaces are noted  He is records indicate that 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  . Chronic pain of left knee Yes    Assessment and plan 55 year old male arthritis lateral compartment recent onset patellofemoral pain recommend injection left knee   Procedure note left knee injection   verbal consent was obtained to inject left knee joint  Timeout was completed to confirm the site of injection  The medications used were 40 mg of Depo-Medrol and 1% lidocaine 3 cc  Anesthesia was provided by ethyl chloride and the skin was prepped with alcohol.  After cleaning the skin with alcohol a 20-gauge needle was used to inject the left knee joint. There were no complications. A sterile bandage was applied.

## 2020-05-29 LAB — PSA: Prostate Specific Ag, Serum: 0.3 ng/mL (ref 0.0–4.0)

## 2020-05-29 LAB — COMPREHENSIVE METABOLIC PANEL
ALT: 53 IU/L — ABNORMAL HIGH (ref 0–44)
AST: 35 IU/L (ref 0–40)
Albumin/Globulin Ratio: 1.7 (ref 1.2–2.2)
Albumin: 4.8 g/dL (ref 3.8–4.9)
Alkaline Phosphatase: 58 IU/L (ref 44–121)
BUN/Creatinine Ratio: 13 (ref 9–20)
BUN: 15 mg/dL (ref 6–24)
Bilirubin Total: 0.5 mg/dL (ref 0.0–1.2)
CO2: 26 mmol/L (ref 20–29)
Calcium: 10.2 mg/dL (ref 8.7–10.2)
Chloride: 98 mmol/L (ref 96–106)
Creatinine, Ser: 1.17 mg/dL (ref 0.76–1.27)
GFR calc Af Amer: 82 mL/min/{1.73_m2} (ref >59)
GFR calc non Af Amer: 71 mL/min/{1.73_m2} (ref >59)
Globulin, Total: 2.8 g/dL (ref 1.5–4.5)
Glucose: 145 mg/dL — ABNORMAL HIGH (ref 65–99)
Potassium: 5 mmol/L (ref 3.5–5.2)
Sodium: 138 mmol/L (ref 134–144)
Total Protein: 7.6 g/dL (ref 6.0–8.5)

## 2020-05-29 LAB — LIPID PANEL
Chol/HDL Ratio: 3.8 ratio (ref 0.0–5.0)
Cholesterol, Total: 160 mg/dL (ref 100–199)
HDL: 42 mg/dL (ref >39)
LDL Chol Calc (NIH): 94 mg/dL (ref 0–99)
Triglycerides: 136 mg/dL (ref 0–149)
VLDL Cholesterol Cal: 24 mg/dL (ref 5–40)

## 2020-05-29 LAB — HEMOGLOBIN A1C
Est. average glucose Bld gHb Est-mCnc: 140 mg/dL
Hgb A1c MFr Bld: 6.5 % — ABNORMAL HIGH (ref 4.8–5.6)

## 2020-06-02 ENCOUNTER — Other Ambulatory Visit: Payer: Self-pay

## 2020-06-02 ENCOUNTER — Ambulatory Visit
Admission: RE | Admit: 2020-06-02 | Discharge: 2020-06-02 | Disposition: A | Discharge status: Home / Self Care | Payer: BC Managed Care – PPO | Source: Ambulatory Visit | Attending: Family Medicine | Admitting: Family Medicine

## 2020-06-02 VITALS — BP 127/82 | HR 94 | Temp 98.1°F | Resp 16

## 2020-06-02 DIAGNOSIS — Z20822 Contact with and (suspected) exposure to covid-19: Secondary | ICD-10-CM | POA: Diagnosis not present

## 2020-06-02 NOTE — ED Triage Notes (Signed)
Here for covid test only 

## 2020-06-03 ENCOUNTER — Encounter: Payer: BC Managed Care – PPO | Admitting: Family Medicine

## 2020-06-04 LAB — NOVEL CORONAVIRUS, NAA: SARS-CoV-2, NAA: NOT DETECTED

## 2020-06-04 LAB — SARS-COV-2, NAA 2 DAY TAT

## 2020-06-11 DIAGNOSIS — E119 Type 2 diabetes mellitus without complications: Secondary | ICD-10-CM | POA: Diagnosis not present

## 2020-06-11 DIAGNOSIS — H47012 Ischemic optic neuropathy, left eye: Secondary | ICD-10-CM | POA: Diagnosis not present

## 2020-06-11 DIAGNOSIS — H2513 Age-related nuclear cataract, bilateral: Secondary | ICD-10-CM | POA: Diagnosis not present

## 2020-06-11 LAB — HM DIABETES EYE EXAM

## 2020-06-16 ENCOUNTER — Other Ambulatory Visit: Payer: Self-pay

## 2020-06-16 ENCOUNTER — Encounter: Payer: Self-pay | Admitting: Family Medicine

## 2020-06-16 ENCOUNTER — Ambulatory Visit (INDEPENDENT_AMBULATORY_CARE_PROVIDER_SITE_OTHER): Payer: BC Managed Care – PPO | Admitting: Family Medicine

## 2020-06-16 VITALS — HR 86 | Temp 98.3°F | Resp 16

## 2020-06-16 DIAGNOSIS — J020 Streptococcal pharyngitis: Secondary | ICD-10-CM

## 2020-06-16 DIAGNOSIS — L738 Other specified follicular disorders: Secondary | ICD-10-CM | POA: Diagnosis not present

## 2020-06-16 DIAGNOSIS — J358 Other chronic diseases of tonsils and adenoids: Secondary | ICD-10-CM

## 2020-06-16 DIAGNOSIS — J019 Acute sinusitis, unspecified: Secondary | ICD-10-CM | POA: Diagnosis not present

## 2020-06-16 LAB — POCT RAPID STREP A (OFFICE): Rapid Strep A Screen: POSITIVE — AB

## 2020-06-16 MED ORDER — PENICILLIN V POTASSIUM 500 MG PO TABS: 500.0000 mg | ORAL_TABLET | Freq: Three times a day (TID) | ORAL | 0 refills | Status: DC

## 2020-06-16 NOTE — Progress Notes (Signed)
Patient presents today with respiratory illness Number of days present-3(started Friday)  Symptoms include- sinus infection, mucus with blood   Presence of worrisome signs (severe shortness of breath, lethargy, etc.) - none  Recent/current visit to urgent care or ER- none  Recent direct exposure to Covid- none  Any current Covid testing- none    Patient ID: Adam Hahn, male    DOB: 1966/04/10, 55 y.o.   MRN: [id number]   Chief Complaint  Patient presents with  . Sinusitis   Subjective:  CC: sinus infection with blood tinged mucous  This is a new problem.  Presents today for an acute visit with a complaint of "sinus infection "reports that he has been having some mucus with blood streaks.  Symptoms started on Friday.  Has tried Mucinex and ibuprofen with minimal relief.  Has a history of recurrent sinus infection, last one was February 29, 2020.  Denies fever, chills, chest pain, shortness of breath.  Endorses sinus pain and pressure and sore throat.    Medical History Adam Hahn has a past medical history of Allergic rhinitis, Arthritis, History of exercise stress test (10-09-2001 in epic), HTN (hypertension), OSA on CPAP, Shoulder impingement, right, Type 2 diabetes mellitus ([place]), and Wears glasses.   Outpatient Encounter Medications as of 06/16/2020  Medication Sig  . cholestyramine (QUESTRAN) 4 GM/DOSE powder USE 1/2 SCOOP TWICE DAILY AS DIRECTED.  Marland Kitchen glipiZIDE (GLUCOTROL) 5 MG tablet Take 1 tablet (5 mg total) by mouth 2 (two) times daily before a meal. Getting meds from [place].  . metFORMIN (GLUCOPHAGE) 1000 MG tablet Take 1 tablet (1,000 mg total) by mouth 2 (two) times daily with a meal.  . penicillin v potassium (VEETID) 500 MG tablet Take 1 tablet (500 mg total) by mouth 3 (three) times daily.  . verapamil (CALAN-SR) 240 MG CR tablet Take 1 tablet (240 mg total) by mouth daily.   No facility-administered encounter medications on file as of 06/16/2020.     Review of  Systems  Constitutional: Negative for chills and fever.  HENT: Positive for sinus pressure, sinus pain and sore throat. Negative for congestion.        From sinus drainage  Respiratory: Negative for cough and shortness of breath.   Gastrointestinal: Negative for abdominal pain, diarrhea, nausea and vomiting.  Neurological: Negative for dizziness, light-headedness and headaches.     Vitals Pulse 86   Temp 98.3 F (36.8 C)   Resp 16   SpO2 97%   Objective:   Physical Exam Vitals reviewed.  Constitutional:      General: He is not in acute distress. HENT:     Right Ear: Tympanic membrane normal.     Left Ear: Tympanic membrane normal.     Nose:     Right Sinus: Frontal sinus tenderness present. No maxillary sinus tenderness.     Left Sinus: Frontal sinus tenderness present. No maxillary sinus tenderness.     Mouth/Throat:     Pharynx: Posterior oropharyngeal erythema present.     Tonsils: Tonsillar exudate present. 2+ on the right. 1+ on the left.  Cardiovascular:     Rate and Rhythm: Normal rate and regular rhythm.     Heart sounds: Normal heart sounds.  Pulmonary:     Effort: Pulmonary effort is normal.     Breath sounds: Normal breath sounds.  Abdominal:     General: Bowel sounds are normal.  Skin:    General: Skin is warm and dry.  Neurological:     General:  No focal deficit present.     Mental Status: He is alert.  Psychiatric:        Behavior: Behavior normal.      Results for orders placed or performed in visit on 06/16/20  POCT rapid strep A  Result Value Ref Range   Rapid Strep A Screen Positive (A) Negative    Assessment and Plan   1. Acute rhinosinusitis - Novel Coronavirus, NAA ([facility])  2. Tonsillar exudate - POCT rapid strep A  3. Strep sore throat - penicillin v potassium (VEETID) 500 MG tablet; Take 1 tablet (500 mg total) by mouth 3 (three) times daily.  Dispense: 21 tablet; Refill: 0 - POCT rapid strep A   Rapid strep positive, will  treat with penicillin 3 times per day for 7 days.  Information given for supportive therapy for strep throat, adequate hydration, and comfort.  Agrees with plan of care discussed today. Understands warning signs to seek further care: chest pain, shortness of breath, any significant change in health.  Understands to follow-up if symptoms do not improve, or worsen.  Instructed to complete full course of antibiotics to prevent future complications.    Chalmers Guest, NP 06/16/2020

## 2020-06-16 NOTE — Patient Instructions (Signed)
Strep Throat, Adult Strep throat is an infection of the throat. It is caused by germs (bacteria). Strep throat is common during the cold months of the year. It mostly affects children who are 5-53 years old. However, people of all ages can get it at any time of the year. When strep throat affects the tonsils, it is called tonsillitis. When it affects the back of the throat, it is called pharyngitis. This infection spreads from person to person through coughing, sneezing, or having close contact. What are the causes? This condition is caused by the Streptococcus pyogenes germ. What increases the risk? You are more likely to develop this condition if:  You care for young children. Children are more likely to get strep throat and may spread it to others.  You go to crowded places. Germs can spread easily in such places.  You kiss or touch someone who has strep throat. What are the signs or symptoms? Symptoms of this condition include:  Fever or chills.  Redness, swelling, or pain in the tonsils or throat.  Pain or trouble when swallowing.  White or yellow spots on the tonsils or throat.  Tender glands in the neck and under the jaw.  Bad breath.  Red rash all over the body. This is rare. How is this treated? This condition may be treated with:  Medicines that kill germs (antibiotics).  Medicines that treat pain or fever. These include: ? Ibuprofen or acetaminophen. ? Aspirin, only for patients who are over the age of 18. ? Throat lozenges. ? Throat sprays. Follow these instructions at home: Medicines  Take over-the-counter and prescription medicines only as told by your doctor.  Take your antibiotic medicine as told by your doctor. Do not stop taking the antibiotic even if you start to feel better.   Eating and drinking  If you have trouble swallowing, eat soft foods until your throat feels better.  Drink enough fluid to keep your pee (urine) pale yellow.  To help with  pain, you may have: ? Warm fluids, such as soup and tea. ? Cold fluids, such as frozen desserts or popsicles.   General instructions  Rinse your mouth (gargle) with a salt-water mixture 3-4 times a day or as needed. To make a salt-water mixture, dissolve -1 tsp (3-6 g) of salt in 1 cup (237 mL) of warm water.  Rest as much as you can.  Stay home from work or school until you have been taking antibiotics for 24 hours.  Avoid smoking or being around people who smoke.  Keep all follow-up visits as told by your doctor. This is important. How is this prevented?  Do not share food, drinking cups, or personal items. They can cause the germs to spread.  Wash your hands well with soap and water. Make sure that all people in your house wash their hands well.  Have family members tested if they have a fever or a sore throat. They may need an antibiotic if they have strep throat.   Contact a doctor if:  You have swelling in your neck that keeps getting bigger.  You get a rash, cough, or earache.  You cough up a thick fluid that is green, yellow-brown, or bloody.  You have pain that does not get better with medicine.  Your symptoms get worse instead of getting better.  You have a fever. Get help right away if:  You vomit.  You have a very bad headache.  Your neck hurts or feels stiff.    You have chest pain or are short of breath.  You have drooling, very bad throat pain, or changes in your voice.  Your neck is swollen, or the skin gets red and tender.  Your mouth is dry, or you are peeing less than normal.  You keep feeling more tired or have trouble waking up.  Your joints are red or painful. Summary  Strep throat is an infection of the throat. It is caused by germs (bacteria).  This infection can spread from person to person through coughing, sneezing, or having close contact.  Take your medicines, including antibiotics, as told by your doctor. Do not stop taking the  antibiotic even if you start to feel better.  To prevent the spread of germs, wash your hands well with soap and water. Have others do the same. Do not share food, drinking cups, or personal items.  Get help right away if you have a bad headache, chest pain, shortness of breath, a stiff or painful neck, or you vomit. This information is not intended to replace advice given to you by your health care provider. Make sure you discuss any questions you have with your health care provider. Document Revised: 07/07/2018 Document Reviewed: 07/07/2018 Elsevier Patient Education  2021 Elsevier Inc.  

## 2020-06-17 LAB — NOVEL CORONAVIRUS, NAA: SARS-CoV-2, NAA: NOT DETECTED

## 2020-06-17 LAB — SARS-COV-2, NAA 2 DAY TAT

## 2020-06-25 DIAGNOSIS — M7542 Impingement syndrome of left shoulder: Secondary | ICD-10-CM | POA: Diagnosis not present

## 2020-06-25 DIAGNOSIS — M13812 Other specified arthritis, left shoulder: Secondary | ICD-10-CM | POA: Diagnosis not present

## 2020-07-14 ENCOUNTER — Ambulatory Visit
Admission: RE | Admit: 2020-07-14 | Discharge: 2020-07-14 | Disposition: A | Discharge status: Home / Self Care | Payer: BC Managed Care – PPO | Source: Ambulatory Visit | Attending: Family Medicine | Admitting: Family Medicine

## 2020-07-14 ENCOUNTER — Other Ambulatory Visit: Payer: Self-pay

## 2020-07-14 VITALS — BP 119/79 | HR 80 | Temp 98.5°F | Resp 19

## 2020-07-14 DIAGNOSIS — J358 Other chronic diseases of tonsils and adenoids: Secondary | ICD-10-CM | POA: Diagnosis not present

## 2020-07-14 LAB — POCT RAPID STREP A (OFFICE): Rapid Strep A Screen: NEGATIVE

## 2020-07-14 NOTE — ED Provider Notes (Signed)
[facility]    CSN: [id number] Arrival date & time: 07/14/20  1448      History   Chief Complaint No chief complaint on file.   HPI Adam Hahn is a 55 y.o. male.   HPI White patches in throat this am. Patient is concern for recurrent strep. Diagnosed with strep in February after exposure from grandchild. Denies any throat pain. White spots resolved with rinsing mouth and gargling this morning. Past Medical History:  Diagnosis Date   Allergic rhinitis    Arthritis    knees,  right shoulder ac joint   History of exercise stress test 10-09-2001 in epic   normal   HTN (hypertension)    OSA on CPAP    Shoulder impingement, right    Type 2 diabetes mellitus ([place])    followed by pcp   Wears glasses     Patient Active Problem List   Diagnosis Date Noted   Tonsillar exudate 06/16/2020   Strep sore throat 06/16/2020   Acute rhinosinusitis 02/29/2020   Newly diagnosed diabetes ([place]) 01/13/2017   Type 2 diabetes mellitus without complications ([place]) 07/68/0881   Prediabetes 05/24/2016   Patellar tendinitis of left knee 12/11/2013   Morbid obesity ([place]) 05/09/2013   Osteoarthritis of left knee 11/09/2012   Obstructive sleep apnea 09/11/2012   HTN (hypertension) 09/11/2012   Impaired fasting glucose 09/11/2012   Degenerative arthritis of left knee 06/03/2011   S/P lateral meniscectomy of left knee 06/03/2011   BACK PAIN 05/01/2009   DERANGEMENT MENISCUS 09/04/2007   ELBOW PAIN, BILATERAL 04/06/2007   DEGENERATIVE JOINT DISEASE, KNEE 03/13/2007   Pain in joint, lower leg 03/13/2007    Past Surgical History:  Procedure Laterality Date   KNEE SURGERY Left x4  last one Lead  09-20-2007  @[facility]    SHOULDER ARTHROSCOPY WITH ROTATOR CUFF REPAIR Right 10/17/2018   Procedure: Right shoulder arthroscopy with subacromial decompression, distal clavicle resection,  rotator cuff DEBRIEDMENT, LABRIAL  DEBRIDMENT;  Surgeon: Justice Britain, MD;  Location: [place];  Service: Orthopedics;  Laterality: Right;  110min       Home Medications    Prior to Admission medications   Medication Sig Start Date End Date Taking? Authorizing Provider  cholestyramine (QUESTRAN) 4 GM/DOSE powder USE 1/2 SCOOP TWICE DAILY AS DIRECTED. 04/10/20   Lovena Le, Malena M, DO  glipiZIDE (GLUCOTROL) 5 MG tablet Take 1 tablet (5 mg total) by mouth 2 (two) times daily before a meal. Getting meds from [place]. 04/10/20   Lovena Le, Malena M, DO  metFORMIN (GLUCOPHAGE) 1000 MG tablet Take 1 tablet (1,000 mg total) by mouth 2 (two) times daily with a meal. 06/05/19   [name], MD  penicillin v potassium (VEETID) 500 MG tablet Take 1 tablet (500 mg total) by mouth 3 (three) times daily. 06/16/20   Chalmers Guest, NP  verapamil (CALAN-SR) 240 MG CR tablet Take 1 tablet (240 mg total) by mouth daily. 06/05/19   [name], MD    Family History Family History  Problem Relation Age of Onset   Hypertension Father    Asthma Mother    Allergic rhinitis Neg Hx    Angioedema Neg Hx    Atopy Neg Hx    Eczema Neg Hx    Immunodeficiency Neg Hx    Urticaria Neg Hx     Social History Social History   Tobacco Use   Smoking status: Never Smoker   Smokeless tobacco: Never Used  Vaping Use   Vaping Use: Never used  Substance Use Topics   Alcohol use: Yes    Comment: occasionally   Drug use: Never     Allergies   Hydrocodone, Lotrel [amlodipine besy-benazepril hcl], Tomato, Lisinopril, and Valsartan   Review of Systems Review of Systems Pertinent negatives listed in HPI  Physical Exam Triage Vital Signs ED Triage Vitals  Enc Vitals Group     BP 07/14/20 1516 119/79     Pulse Rate 07/14/20 1516 80     Resp 07/14/20 1516 19     Temp 07/14/20 1516 98.5 F (36.9 C)     Temp Source 07/14/20 1516 Oral     SpO2 07/14/20 1516 95 %     Weight --      Height --      Head  Circumference --      Peak Flow --      Pain Score 07/14/20 1515 0     Pain Loc --      Pain Edu? --      Excl. in [place]? --    No data found.  Updated Vital Signs BP 119/79 (BP Location: Right Arm)    Pulse 80    Temp 98.5 F (36.9 C) (Oral)    Resp 19    SpO2 95%   Visual Acuity Right Eye Distance:   Left Eye Distance:   Bilateral Distance:    Right Eye Near:   Left Eye Near:    Bilateral Near:     Physical Exam General Appearance:    Alert, cooperative, no distress  HENT:   ENT exam normal, no neck nodes, no tonsil enlargement, right tonsillar stone present or sinus tenderness  Eyes:    PERRL, conjunctiva/corneas clear, EOM's intact       Lungs:     Clear to auscultation bilaterally, respirations unlabored  Heart:    Regular rate and rhythm  Neurologic:   Awake, alert, oriented x 3. No apparent focal neurological           defect.         UC Treatments / Results  Labs (all labs ordered are listed, but only abnormal results are displayed) Labs Reviewed  CULTURE, GROUP A STREP [facility])  POCT RAPID STREP A (OFFICE)    EKG   Radiology No results found.  Procedures Procedures (including critical care time)  Medications Ordered in UC Medications - No data to display  Initial Impression / Assessment and Plan / UC Course  I have reviewed the triage vital signs and the nursing notes.  Pertinent labs & imaging results that were available during my care of the patient were reviewed by me and considered in my medical decision making (see chart for details).    Rapid strep negative. Throat culture pending. Exam findings that white patches is that of a right tonsillar stone. Absent of throat pain. No intervention warranted.  Final Clinical Impressions(s) / UC Diagnoses   Final diagnoses:  Tonsil stone   Discharge Instructions   None    ED Prescriptions    None     PDMP not reviewed this encounter.   Scot Jun, FNP 07/15/20 2256

## 2020-07-14 NOTE — ED Triage Notes (Signed)
Seen white patches on his tonsils today.  Denies sore throat.  States he had strep throat last month.

## 2020-07-16 LAB — CULTURE, GROUP A STREP (THRC)

## 2020-07-18 DIAGNOSIS — H469 Unspecified optic neuritis: Secondary | ICD-10-CM | POA: Diagnosis not present

## 2020-07-18 DIAGNOSIS — E119 Type 2 diabetes mellitus without complications: Secondary | ICD-10-CM | POA: Diagnosis not present

## 2020-07-18 DIAGNOSIS — H4720 Unspecified optic atrophy: Secondary | ICD-10-CM | POA: Diagnosis not present

## 2020-07-18 DIAGNOSIS — H2513 Age-related nuclear cataract, bilateral: Secondary | ICD-10-CM | POA: Diagnosis not present

## 2020-07-28 ENCOUNTER — Encounter: Payer: Self-pay | Admitting: Orthopedic Surgery

## 2020-07-28 ENCOUNTER — Other Ambulatory Visit: Payer: Self-pay

## 2020-07-28 ENCOUNTER — Ambulatory Visit: Payer: BC Managed Care – PPO | Admitting: Orthopedic Surgery

## 2020-07-28 ENCOUNTER — Ambulatory Visit: Payer: BC Managed Care – PPO

## 2020-07-28 VITALS — BP 156/100 | HR 100 | Ht 73.0 in | Wt 295.0 lb

## 2020-07-28 DIAGNOSIS — M25561 Pain in right knee: Secondary | ICD-10-CM | POA: Diagnosis not present

## 2020-07-28 MED ORDER — IBUPROFEN 800 MG PO TABS: 800.0000 mg | ORAL_TABLET | Freq: Three times a day (TID) | ORAL | 1 refills | Status: DC | PRN

## 2020-07-28 MED ORDER — INDOMETHACIN 25 MG PO CAPS: 25.0000 mg | ORAL_CAPSULE | Freq: Two times a day (BID) | ORAL | 1 refills | Status: DC

## 2020-07-28 NOTE — Progress Notes (Signed)
Chief Complaint  Patient presents with  . Knee Pain    Right/ since Wed no injury     55 year old male presents with acute pain right knee near his patella tendon insertion no trauma it is causing him some difficulty walking  Past Medical History:  Diagnosis Date  . Allergic rhinitis   . Arthritis    knees,  right shoulder ac joint  . History of exercise stress test 10-09-2001 in epic   normal  . HTN (hypertension)   . OSA on CPAP   . Shoulder impingement, right   . Type 2 diabetes mellitus ([place])    followed by pcp  . Wears glasses     History of a GI bleed from Indocin  Still reports back pain when taking prednisone  Physical Exam Constitutional:      General: He is not in acute distress.    Appearance: He is well-developed.     Comments: Well developed, well nourished Normal grooming and hygiene     Cardiovascular:     Comments: No peripheral edema Musculoskeletal:     Comments: Right knee no effusion full range of motion normal stability but tenderness at the prominent patellar tendon insertion and tibial tubercle  Skin:    General: Skin is warm and dry.  Neurological:     Mental Status: He is alert and oriented to person, place, and time.     Sensory: No sensory deficit.     Coordination: Coordination normal.     Gait: Gait normal.     Deep Tendon Reflexes: Reflexes are normal and symmetric.  Psychiatric:        Mood and Affect: Mood normal.        Behavior: Behavior normal.        Thought Content: Thought content normal.        Judgment: Judgment normal.     Comments: Affect normal       Meds ordered this encounter  Medications  . ibuprofen (ADVIL) 800 MG tablet    Sig: Take 1 tablet (800 mg total) by mouth every 8 (eight) hours as needed.    Dispense:  90 tablet    Refill:  1  . indomethacin (INDOCIN) 25 MG capsule    Sig: Take 1 capsule (25 mg total) by mouth 2 (two) times daily with a meal.    Dispense:  28 capsule    Refill:  1    Current  Outpatient Medications:  .  cholestyramine (QUESTRAN) 4 GM/DOSE powder, USE 1/2 SCOOP TWICE DAILY AS DIRECTED., Disp: 378 g, Rfl: 5 .  glipiZIDE (GLUCOTROL) 5 MG tablet, Take 1 tablet (5 mg total) by mouth 2 (two) times daily before a meal. Getting meds from [place]., Disp: 60 tablet, Rfl: 0 .  ibuprofen (ADVIL) 800 MG tablet, Take 1 tablet (800 mg total) by mouth every 8 (eight) hours as needed., Disp: 90 tablet, Rfl: 1 .  indomethacin (INDOCIN) 25 MG capsule, Take 1 capsule (25 mg total) by mouth 2 (two) times daily with a meal., Disp: 28 capsule, Rfl: 1 .  metFORMIN (GLUCOPHAGE) 1000 MG tablet, Take 1 tablet (1,000 mg total) by mouth 2 (two) times daily with a meal., Disp: 180 tablet, Rfl: 1 .  verapamil (CALAN-SR) 240 MG CR tablet, Take 1 tablet (240 mg total) by mouth daily., Disp: 30 tablet, Rfl: 5  Encounter Diagnoses  Name Primary?  . Acute pain of right knee   . Patellar tenderness, right Yes    We discussed  possible ibuprofen Indocin Understanding the risk of recurrent GI bleed prednisone inside ago with Indocin  Meds ordered this encounter  Medications  .               . indomethacin (INDOCIN) 25 MG capsule    Sig: Take 1 capsule (25 mg total) by mouth 2 (two) times daily with a meal.    Dispense:  28 capsule    Refill:  1    As needed follow-up  Acute uncomplicated prescription management office x-ray

## 2020-07-28 NOTE — Patient Instructions (Addendum)
Indocin  w food  Patellar Tendinitis  Patellar tendinitis is also called jumper's knee or patellar tendinopathy. This condition happens when there is damage to and inflammation of the patellar tendon. Tendons are cord-like tissues that connect muscles to bones. The patellar tendon connects the bottom of the kneecap (patella) to the top of the shin bone (tibia). Patellar tendinitis causes pain in the front of the knee. The condition happens in the following stages:  Stage 1: In this stage, you have pain only after activity.  Stage 2: In this stage, you have pain during and after activity.  Stage 3: In this stage, you have pain at rest as well as during and after activity. The pain limits your ability to do the activity.  Stage 4: In this stage, the tendon tears and severely limits your activity. What are the causes? This condition is caused by repeated (repetitive) stress on the tendon. This stress may cause the tendon to stretch, swell, thicken, or tear. What increases the risk? The following factors may make you more likely to develop this condition:  Participating in sports that involve running, kicking, and jumping, especially on hard surfaces. These include: ? Basketball. ? Volleyball. ? Soccer. ? Track and field.  Training too hard.  Having tight thigh muscles.  Having received steroid injections in the tendon.  Having had knee surgery.  Being 68-33 years old.  Having rheumatoid arthritis, diabetes, or kidney disease. These conditions interrupt blood flow to the knee, causing the tendon to weaken. What are the signs or symptoms? The main symptom of this condition is pain and swelling in the front of the knee. The pain usually starts slowly and gradually gets worse. It may become painful to straighten your leg. The pain may get worse when you walk, run, or jump. How is this diagnosed? This condition may be diagnosed based on:  Your symptoms.  Your medical history.  A  physical exam. During the physical exam, your health care provider may check for: ? Tenderness in your patella. ? Tightness in your thigh muscles. ? Pain when you straighten your knee.  Imaging tests, including: ? X-rays. These will show the position and condition of your patella. ? An MRI. This will show any abnormality of the tendon. ? Ultrasound. This will show any swelling or other abnormalities of the tendon. How is this treated? Treatment for this condition depends on the stage of the condition. It may involve:  Avoiding activities that cause pain, such as jumping.  Icing and elevating your knee.  Having sound wave stimulation to promote healing.  Doing stretching and strengthening exercises (physical therapy) when pain and swelling improve.  Wearing a knee brace. This may be needed if your condition does not improve with treatment.  Using crutches or a walker. This may be needed if your condition does not improve with treatment.  Surgery. This may be done if you have stage 4 tendinitis. Follow these instructions at home: If you have a brace:  Wear the brace as told by your health care provider. Remove it only as told by your health care provider.  Loosen the brace if your toes tingle, become numb, or turn cold and blue.  Keep the brace clean.  If the brace is not waterproof: ? Do not let it get wet. ? Cover it with a watertight covering when you take a bath or shower.  Ask your health care provider when it is safe for you to drive. Managing pain, stiffness, and swelling  If directed, put ice on the injured area. ? If you have a removable brace, remove it as told by your health care provider. ? Put ice in a plastic bag. ? Place a towel between your skin and the bag. ? Leave the ice on for 20 minutes, 2-3 times a day.  Move your toes often to reduce stiffness and swelling.  Raise (elevate) your knee above the level of your heart while you are sitting or lying  down.   Activity  Do not use the injured limb to support your body weight until your health care provider says that you can. Use your crutches or a walker as told by your health care provider.  Return to your normal activities as told by your health care provider. Ask your health care provider what activities are safe for you.  Do exercises as told by your health care provider or physical therapist. General instructions  Take over-the-counter and prescription medicines only as told by your health care provider.  Do not use any products that contain nicotine or tobacco, such as cigarettes, e-cigarettes, and chewing tobacco. These can delay healing. If you need help quitting, ask your health care provider.  Keep all follow-up visits as told by your health care provider. This is important. How is this prevented?  Warm up and stretch before being active.  Cool down and stretch after being active.  Give your body time to rest between periods of activity. ? You may need to reduce how often you play a sport that requires frequent jumping.  Make sure to use equipment that fits you.  Be safe and responsible while being active. This will help you avoid falls which can damage the tendon.  Do at least 150 minutes of moderate-intensity exercise each week, such as brisk walking or water aerobics.  Maintain physical fitness, including: ? Strength. ? Flexibility. ? Cardiovascular fitness. ? Endurance. Contact a health care provider if:  Your symptoms have not improved in 6 weeks.  Your symptoms get worse. Summary  Patellar tendinitis is also called jumper's knee or patellar tendinopathy. This condition happens when there is damage to and inflammation of the patellar tendon.  Treatment for this condition depends on the stage of the condition and may include rest, ice, exercises, medicines, and surgery.  Do not use the injured limb to support your body weight until your health care  provider says that you can.  Take over-the-counter and prescription medicines only as told by your health care provider.  Keep all follow-up visits as told by your health care provider. This is important. This information is not intended to replace advice given to you by your health care provider. Make sure you discuss any questions you have with your health care provider. Document Revised: 08/10/2018 Document Reviewed: 03/13/2018 Elsevier Patient Education  2021 [employer].

## 2020-07-29 DIAGNOSIS — H4720 Unspecified optic atrophy: Secondary | ICD-10-CM | POA: Diagnosis not present

## 2020-07-29 DIAGNOSIS — H469 Unspecified optic neuritis: Secondary | ICD-10-CM | POA: Diagnosis not present

## 2020-07-30 ENCOUNTER — Encounter (INDEPENDENT_AMBULATORY_CARE_PROVIDER_SITE_OTHER): Payer: Self-pay | Admitting: *Deleted

## 2020-07-30 ENCOUNTER — Other Ambulatory Visit (INDEPENDENT_AMBULATORY_CARE_PROVIDER_SITE_OTHER): Payer: Self-pay | Admitting: *Deleted

## 2020-07-30 ENCOUNTER — Telehealth (INDEPENDENT_AMBULATORY_CARE_PROVIDER_SITE_OTHER): Payer: Self-pay | Admitting: *Deleted

## 2020-07-30 NOTE — Telephone Encounter (Signed)
Patient needs plenvu 

## 2020-07-31 ENCOUNTER — Other Ambulatory Visit (INDEPENDENT_AMBULATORY_CARE_PROVIDER_SITE_OTHER): Payer: Self-pay

## 2020-07-31 ENCOUNTER — Telehealth (INDEPENDENT_AMBULATORY_CARE_PROVIDER_SITE_OTHER): Payer: Self-pay

## 2020-07-31 ENCOUNTER — Ambulatory Visit: Payer: BC Managed Care – PPO | Admitting: Orthopedic Surgery

## 2020-07-31 ENCOUNTER — Encounter (INDEPENDENT_AMBULATORY_CARE_PROVIDER_SITE_OTHER): Payer: Self-pay

## 2020-07-31 DIAGNOSIS — Z1211 Encounter for screening for malignant neoplasm of colon: Secondary | ICD-10-CM

## 2020-07-31 NOTE — Telephone Encounter (Signed)
Referring MD/PCP: Elvia Collum  Procedure: Tcs  Reason/Indication:  Screening  Has patient had this procedure before?  no  If so, when, by whom and where?    Is there a family history of colon cancer?  no  Who?  What age when diagnosed?    Is patient diabetic?   yes      Does patient have prosthetic heart valve or mechanical valve?  no  Do you have a pacemaker/defibrillator?  no  Has patient ever had endocarditis/atrial fibrillation? no  Have you had a stroke/heart attack last 6 mths? no  Does patient use oxygen? no  Has patient had joint replacement within last 12 months?  no  Is patient constipated or do they take laxatives? no  Does patient have a history of alcohol/drug use?  no  Is patient on blood thinner such as Coumadin, Plavix and/or Aspirin? no  Do you take medicine for weight loss. No  Medications: Verapamil 240mg  daily, Metformin 1000 mg bid, Glipizide 5 mg bid  Allergies: Losartan, hydrocodone  Hold Medications Per Dr Jenetta Downer Hold Diabetic Medications the evening prior and morning of   Procedure date & time: 08/13/20 / AM

## 2020-07-31 NOTE — Telephone Encounter (Signed)
Ok to schedule.  Thanks,  Alaysiah Browder Castaneda Mayorga, MD Gastroenterology and Hepatology [facility]  

## 2020-08-01 MED ORDER — PLENVU 140 G PO SOLR: 1.0000 | Freq: Once | ORAL | 0 refills | Status: AC

## 2020-08-01 NOTE — Telephone Encounter (Signed)
Bowel prep sent °

## 2020-08-06 ENCOUNTER — Encounter (HOSPITAL_COMMUNITY): Payer: Self-pay | Admitting: Gastroenterology

## 2020-08-11 ENCOUNTER — Other Ambulatory Visit: Payer: Self-pay

## 2020-08-11 ENCOUNTER — Other Ambulatory Visit (HOSPITAL_COMMUNITY)
Admission: RE | Admit: 2020-08-11 | Discharge: 2020-08-11 | Disposition: A | Discharge status: Home / Self Care | Payer: BC Managed Care – PPO | Source: Ambulatory Visit | Attending: Gastroenterology | Admitting: Gastroenterology

## 2020-08-11 DIAGNOSIS — E119 Type 2 diabetes mellitus without complications: Secondary | ICD-10-CM | POA: Diagnosis not present

## 2020-08-11 DIAGNOSIS — Z888 Allergy status to other drugs, medicaments and biological substances status: Secondary | ICD-10-CM | POA: Diagnosis not present

## 2020-08-11 DIAGNOSIS — Z01812 Encounter for preprocedural laboratory examination: Secondary | ICD-10-CM | POA: Insufficient documentation

## 2020-08-11 DIAGNOSIS — I10 Essential (primary) hypertension: Secondary | ICD-10-CM | POA: Diagnosis not present

## 2020-08-11 DIAGNOSIS — K635 Polyp of colon: Secondary | ICD-10-CM | POA: Diagnosis not present

## 2020-08-11 DIAGNOSIS — Z20822 Contact with and (suspected) exposure to covid-19: Secondary | ICD-10-CM | POA: Insufficient documentation

## 2020-08-11 DIAGNOSIS — Z91018 Allergy to other foods: Secondary | ICD-10-CM | POA: Diagnosis not present

## 2020-08-11 DIAGNOSIS — Z885 Allergy status to narcotic agent status: Secondary | ICD-10-CM | POA: Diagnosis not present

## 2020-08-11 DIAGNOSIS — Z1211 Encounter for screening for malignant neoplasm of colon: Secondary | ICD-10-CM | POA: Diagnosis not present

## 2020-08-11 DIAGNOSIS — G4733 Obstructive sleep apnea (adult) (pediatric): Secondary | ICD-10-CM | POA: Diagnosis not present

## 2020-08-11 DIAGNOSIS — Z7984 Long term (current) use of oral hypoglycemic drugs: Secondary | ICD-10-CM | POA: Diagnosis not present

## 2020-08-12 LAB — SARS CORONAVIRUS 2 (TAT 6-24 HRS): SARS Coronavirus 2: NEGATIVE

## 2020-08-13 ENCOUNTER — Ambulatory Visit (HOSPITAL_COMMUNITY): Payer: BC Managed Care – PPO | Admitting: Anesthesiology

## 2020-08-13 ENCOUNTER — Encounter (HOSPITAL_COMMUNITY)
Admission: RE | Disposition: A | Discharge status: Home / Self Care | Payer: Self-pay | Source: Ambulatory Visit | Attending: Gastroenterology

## 2020-08-13 ENCOUNTER — Other Ambulatory Visit: Payer: Self-pay

## 2020-08-13 ENCOUNTER — Ambulatory Visit (HOSPITAL_COMMUNITY)
Admission: RE | Admit: 2020-08-13 | Discharge: 2020-08-13 | Disposition: A | Discharge status: Home / Self Care | Payer: BC Managed Care – PPO | Source: Ambulatory Visit | Attending: Gastroenterology | Admitting: Gastroenterology

## 2020-08-13 ENCOUNTER — Encounter (HOSPITAL_COMMUNITY): Payer: Self-pay | Admitting: Gastroenterology

## 2020-08-13 DIAGNOSIS — Z1211 Encounter for screening for malignant neoplasm of colon: Secondary | ICD-10-CM | POA: Insufficient documentation

## 2020-08-13 DIAGNOSIS — K635 Polyp of colon: Secondary | ICD-10-CM | POA: Insufficient documentation

## 2020-08-13 DIAGNOSIS — Z7984 Long term (current) use of oral hypoglycemic drugs: Secondary | ICD-10-CM | POA: Diagnosis not present

## 2020-08-13 DIAGNOSIS — Z888 Allergy status to other drugs, medicaments and biological substances status: Secondary | ICD-10-CM | POA: Insufficient documentation

## 2020-08-13 DIAGNOSIS — Z91018 Allergy to other foods: Secondary | ICD-10-CM | POA: Insufficient documentation

## 2020-08-13 DIAGNOSIS — Z885 Allergy status to narcotic agent status: Secondary | ICD-10-CM | POA: Diagnosis not present

## 2020-08-13 DIAGNOSIS — G4733 Obstructive sleep apnea (adult) (pediatric): Secondary | ICD-10-CM | POA: Insufficient documentation

## 2020-08-13 DIAGNOSIS — Z20822 Contact with and (suspected) exposure to covid-19: Secondary | ICD-10-CM | POA: Insufficient documentation

## 2020-08-13 DIAGNOSIS — I10 Essential (primary) hypertension: Secondary | ICD-10-CM | POA: Diagnosis not present

## 2020-08-13 DIAGNOSIS — E119 Type 2 diabetes mellitus without complications: Secondary | ICD-10-CM | POA: Insufficient documentation

## 2020-08-13 DIAGNOSIS — D123 Benign neoplasm of transverse colon: Secondary | ICD-10-CM

## 2020-08-13 DIAGNOSIS — G4730 Sleep apnea, unspecified: Secondary | ICD-10-CM | POA: Diagnosis not present

## 2020-08-13 HISTORY — PX: POLYPECTOMY: SHX5525

## 2020-08-13 HISTORY — PX: COLONOSCOPY WITH PROPOFOL: SHX5780

## 2020-08-13 LAB — GLUCOSE, CAPILLARY: Glucose-Capillary: 123 mg/dL — ABNORMAL HIGH (ref 70–99)

## 2020-08-13 LAB — HM COLONOSCOPY

## 2020-08-13 SURGERY — COLONOSCOPY WITH PROPOFOL
Anesthesia: General

## 2020-08-13 MED ORDER — CHLORHEXIDINE GLUCONATE CLOTH 2 % EX PADS
6.0000 | MEDICATED_PAD | Freq: Once | CUTANEOUS | Status: DC
Start: 2020-08-13 — End: 2020-08-13

## 2020-08-13 MED ORDER — LACTATED RINGERS IV SOLN
INTRAVENOUS | Status: DC
  Administered 2020-08-13: 1000 mL via INTRAVENOUS

## 2020-08-13 MED ORDER — PROPOFOL 500 MG/50ML IV EMUL
INTRAVENOUS | Status: DC | PRN
  Administered 2020-08-13: 200 ug/kg/min via INTRAVENOUS

## 2020-08-13 MED ORDER — PROPOFOL 10 MG/ML IV BOLUS
INTRAVENOUS | Status: DC | PRN
  Administered 2020-08-13: 50 mg via INTRAVENOUS

## 2020-08-13 MED ORDER — CHLORHEXIDINE GLUCONATE CLOTH 2 % EX PADS: 6.0000 | MEDICATED_PAD | Freq: Once | CUTANEOUS | Status: DC

## 2020-08-13 MED ORDER — STERILE WATER FOR IRRIGATION IR SOLN
Status: DC | PRN
  Administered 2020-08-13: 100 mL

## 2020-08-13 MED ORDER — GLYCOPYRROLATE PF 0.2 MG/ML IJ SOSY
PREFILLED_SYRINGE | INTRAMUSCULAR | Status: DC | PRN
  Administered 2020-08-13: .1 mg via INTRAVENOUS

## 2020-08-13 NOTE — Transfer of Care (Signed)
Immediate Anesthesia Transfer of Care Note  Patient: [name]  Procedure(s) Performed: COLONOSCOPY WITH PROPOFOL (N/A ) POLYPECTOMY  Patient Location: Endoscopy Unit  Anesthesia Type:MAC  Level of Consciousness: awake, alert , oriented and patient cooperative  Airway & Oxygen Therapy: Patient Spontanous Breathing and Patient connected to nasal cannula oxygen  Post-op Assessment: Report given to RN, Post -op Vital signs reviewed and stable and Patient moving all extremities  Post vital signs: Reviewed and stable  Last Vitals:  Vitals Value Taken Time  BP    Temp    Pulse    Resp    SpO2      Last Pain:  Vitals:   08/13/20 0844  TempSrc:   PainSc: 0-No pain      Patients Stated Pain Goal: 7 (06/34/94 9447)  Complications: No complications documented.

## 2020-08-13 NOTE — Anesthesia Preprocedure Evaluation (Signed)
Anesthesia Evaluation  Patient identified by MRN, date of birth, ID band Patient awake    Reviewed: Allergy & Precautions, NPO status , Patient's Chart, lab work & pertinent test results  History of Anesthesia Complications Negative for: history of anesthetic complications  Airway Mallampati: III  TM Distance: >3 FB Neck ROM: Full    Dental  (+) Teeth Intact, Dental Advisory Given   Pulmonary sleep apnea and Continuous Positive Airway Pressure Ventilation ,    Pulmonary exam normal breath sounds clear to auscultation       Cardiovascular Exercise Tolerance: Good hypertension, Pt. on medications Normal cardiovascular exam Rhythm:Regular Rate:Normal     Neuro/Psych negative neurological ROS  negative psych ROS   GI/Hepatic negative GI ROS, Neg liver ROS,   Endo/Other  diabetes, Well Controlled, Type 2, Oral Hypoglycemic Agents  Renal/GU negative Renal ROS     Musculoskeletal  (+) Arthritis ,   Abdominal   Peds  Hematology negative hematology ROS (+)   Anesthesia Other Findings   Reproductive/Obstetrics negative OB ROS                             Anesthesia Physical Anesthesia Plan  ASA: III  Anesthesia Plan: General   Post-op Pain Management:    Induction: Intravenous  PONV Risk Score and Plan: Propofol infusion  Airway Management Planned: Nasal Cannula and Natural Airway  Additional Equipment:   Intra-op Plan:   Post-operative Plan:   Informed Consent: I have reviewed the patients History and Physical, chart, labs and discussed the procedure including the risks, benefits and alternatives for the proposed anesthesia with the patient or authorized representative who has indicated his/her understanding and acceptance.     Dental advisory given  Plan Discussed with: CRNA and Surgeon  Anesthesia Plan Comments:         Anesthesia Quick Evaluation

## 2020-08-13 NOTE — Discharge Instructions (Signed)
You are being discharged to home.  Resume your previous diet.  We are waiting for your pathology results.  Your physician has recommended a repeat colonoscopy for surveillance based on pathology results.    Colonoscopy, Adult, Care After This sheet gives you information about how to care for yourself after your procedure. Your doctor may also give you more specific instructions. If you have problems or questions, call your doctor. What can I expect after the procedure? After the procedure, it is common to have:  A small amount of blood in your poop (stool) for 24 hours.  Some gas.  Mild cramping or bloating in your belly (abdomen). Follow these instructions at home: Eating and drinking  Drink enough fluid to keep your pee (urine) pale yellow.  Follow instructions from your doctor about what you cannot eat or drink.  Return to your normal diet as told by your doctor. Avoid heavy or fried foods that are hard to digest.   Activity  Rest as told by your doctor.  Do not sit for a long time without moving. Get up to take short walks every 1-2 hours. This is important. Ask for help if you feel weak or unsteady.  Return to your normal activities as told by your doctor. Ask your doctor what activities are safe for you. To help cramping and bloating:  Try walking around.  Put heat on your belly as told by your doctor. Use the heat source that your doctor recommends, such as a moist heat pack or a heating pad. ? Put a towel between your skin and the heat source. ? Leave the heat on for 20-30 minutes. ? Remove the heat if your skin turns bright red. This is very important if you are unable to feel pain, heat, or cold. You may have a greater risk of getting burned.   General instructions  If you were given a medicine to help you relax (sedative) during your procedure, it can affect you for many hours. Do not drive or use machinery until your doctor says that it is safe.  For the first  24 hours after the procedure: ? Do not sign important documents. ? Do not drink alcohol. ? Do your daily activities more slowly than normal. ? Eat foods that are soft and easy to digest.  Take over-the-counter or prescription medicines only as told by your doctor.  Keep all follow-up visits as told by your doctor. This is important. Contact a doctor if:  You have blood in your poop 2-3 days after the procedure. Get help right away if:  You have more than a small amount of blood in your poop.  You see large clumps of tissue (blood clots) in your poop.  Your belly is swollen.  You feel like you may vomit (nauseous).  You vomit.  You have a fever.  You have belly pain that gets worse, and medicine does not help your pain. Summary  After the procedure, it is common to have a small amount of blood in your poop. You may also have mild cramping and bloating in your belly.  If you were given a medicine to help you relax (sedative) during your procedure, it can affect you for many hours. Do not drive or use machinery until your doctor says that it is safe.  Get help right away if you have a lot of blood in your poop, feel like you may vomit, have a fever, or have more belly pain. This information is not  intended to replace advice given to you by your health care provider. Make sure you discuss any questions you have with your health care provider. Document Revised: 02/23/2019 Document Reviewed: 11/13/2018 Elsevier Patient Education  [employer].  Colon Polyps  Colon polyps are tissue growths inside the colon, which is part of the large intestine. They are one of the types of polyps that can grow in the body. A polyp may be a round bump or a mushroom-shaped growth. You could have one polyp or more than one. Most colon polyps are noncancerous (benign). However, some colon polyps can become cancerous over time. Finding and removing the polyps early can help prevent this. What are  the causes? The exact cause of colon polyps is not known. What increases the risk? The following factors may make you more likely to develop this condition:  Having a family history of colorectal cancer or colon polyps.  Being older than 53 years of age.  Being younger than 53 years of age and having a significant family history of colorectal cancer or colon polyps or a genetic condition that puts you at higher risk of getting colon polyps.  Having inflammatory bowel disease, such as ulcerative colitis or Crohn's disease.  Having certain conditions passed from parent to child (hereditary conditions), such as: ? Familial adenomatous polyposis (FAP). ? Lynch syndrome. ? Turcot syndrome. ? Peutz-Jeghers syndrome. ? MUTYH-associated polyposis (MAP).  Being overweight.  Certain lifestyle factors. These include smoking cigarettes, drinking too much alcohol, not getting enough exercise, and eating a diet that is high in fat and red meat and low in fiber.  Having had childhood cancer that was treated with radiation of the abdomen. What are the signs or symptoms? Many times, there are no symptoms. If you have symptoms, they may include:  Blood coming from the rectum during a bowel movement.  Blood in the stool (feces). The blood may be bright red or very dark in color.  Pain in the abdomen.  A change in bowel habits, such as constipation or diarrhea. How is this diagnosed? This condition is diagnosed with a colonoscopy. This is a procedure in which a lighted, flexible scope is inserted into the opening between the buttocks (anus) and then passed into the colon to examine the area. Polyps are sometimes found when a colonoscopy is done as part of routine cancer screening tests. How is this treated? This condition is treated by removing any polyps that are found. Most polyps can be removed during a colonoscopy. Those polyps will then be tested for cancer. Additional treatment may be needed  depending on the results of testing. Follow these instructions at home: Eating and drinking  Eat foods that are high in fiber, such as fruits, vegetables, and whole grains.  Eat foods that are high in calcium and vitamin D, such as milk, cheese, yogurt, eggs, liver, fish, and broccoli.  Limit foods that are high in fat, such as fried foods and desserts.  Limit the amount of red meat, precooked or cured meat, or other processed meat that you eat, such as hot dogs, sausages, bacon, or meat loaves.  Limit sugary drinks.   Lifestyle  Maintain a healthy weight, or lose weight if recommended by your health care provider.  Exercise every day or as told by your health care provider.  Do not use any products that contain nicotine or tobacco, such as cigarettes, e-cigarettes, and chewing tobacco. If you need help quitting, ask your health care provider.  Do  not drink alcohol if: ? Your health care provider tells you not to drink. ? You are pregnant, may be pregnant, or are planning to become pregnant.  If you drink alcohol: ? Limit how much you use to:  0-1 drink a day for women.  0-2 drinks a day for men. ? Know how much alcohol is in your drink. In the [place] one drink equals one 12 oz bottle of beer (355 mL), one 5 oz glass of wine (148 mL), or one 1 oz glass of hard liquor (44 mL). General instructions  Take over-the-counter and prescription medicines only as told by your health care provider.  Keep all follow-up visits. This is important. This includes having regularly scheduled colonoscopies. Talk to your health care provider about when you need a colonoscopy. Contact a health care provider if:  You have new or worsening bleeding during a bowel movement.  You have new or increased blood in your stool.  You have a change in bowel habits.  You lose weight for no known reason. Summary  Colon polyps are tissue growths inside the colon, which is part of the large intestine.  They are one type of polyp that can grow in the body.  Most colon polyps are noncancerous (benign), but some can become cancerous over time.  This condition is diagnosed with a colonoscopy.  This condition is treated by removing any polyps that are found. Most polyps can be removed during a colonoscopy. This information is not intended to replace advice given to you by your health care provider. Make sure you discuss any questions you have with your health care provider. Document Revised: 08/08/2019 Document Reviewed: 08/08/2019 Elsevier Patient Education  2021 [employer].

## 2020-08-13 NOTE — Op Note (Signed)
[facility] Patient Name: Adam Hahn Procedure Date: 08/13/2020 8:38 AM MRN: [id number] Date of Birth: [birth year] Attending MD: Maylon Peppers ,  CSN: [id number] Age: 55 Admit Type: Outpatient Procedure:                Colonoscopy Indications:              Screening for colorectal malignant neoplasm Providers:                Maylon Peppers, Crystal Page, Rosina Lowenstein, RN Referring MD:              Medicines:                Monitored Anesthesia Care Complications:            No immediate complications. Estimated Blood Loss:     Estimated blood loss: none. Procedure:                Pre-Anesthesia Assessment:                           - Prior to the procedure, a History and Physical                            was performed, and patient medications, allergies                            and sensitivities were reviewed. The patient's                            tolerance of previous anesthesia was reviewed.                           - The risks and benefits of the procedure and the                            sedation options and risks were discussed with the                            patient. All questions were answered and informed                            consent was obtained.                           - ASA Grade Assessment: II - A patient with mild                            systemic disease.                           After obtaining informed consent, the colonoscope                            was passed under direct vision. Throughout the                            procedure, the patient's blood pressure,  pulse, and                            oxygen saturations were monitored continuously. The                            [id number]-[id number] ([id number]) scope was introduced through                            the anus and advanced to the the cecum, identified                            by appendiceal orifice and ileocecal valve. The                            colonoscopy was  performed without difficulty. The                            patient tolerated the procedure well. The quality                            of the bowel preparation was excellent. Scope                            withdrawal time was 9 minutes. Scope In: 8:51:19 AM Scope Out: 9:06:07 AM Scope Withdrawal Time: 0 hours 9 minutes 44 seconds  Total Procedure Duration: 0 hours 14 minutes 48 seconds  Findings:      The perianal and digital rectal examinations were normal.      A 6 mm polyp was found in the transverse colon. The polyp was sessile.       The polyp was removed with a cold snare. Resection and retrieval were       complete.      The retroflexed view of the distal rectum and anal verge was normal and       showed no anal or rectal abnormalities. Impression:               - One 6 mm polyp in the transverse colon, removed                            with a cold snare. Resected and retrieved.                           - The distal rectum and anal verge are normal on                            retroflexion view. Moderate Sedation:      Per Anesthesia Care Recommendation:           - Discharge patient to home (ambulatory).                           - Resume previous diet.                           - Await pathology results.                           -  Repeat colonoscopy for surveillance based on                            pathology results. Procedure Code(s):        --- Professional ---                           [phone], Colonoscopy, flexible; with removal of                            tumor(s), polyp(s), or other lesion(s) by snare                            technique Diagnosis Code(s):        --- Professional ---                           Z12.11, Encounter for screening for malignant                            neoplasm of colon                           K63.5, Polyp of colon CPT copyright 2019 American Medical Association. All rights reserved. The codes documented in this report are  preliminary and upon coder review may  be revised to meet current compliance requirements. Maylon Peppers, MD Maylon Peppers,  08/13/2020 9:09:33 AM This report has been signed electronically. Number of Addenda: 0

## 2020-08-13 NOTE — H&P (Signed)
Adam Hahn is an 55 y.o. male.   Chief Complaint: screening for CRC HPI: 55 year old male with past medical history of arthritis, hypertension, OSA and type 2 diabetes, coming for screening colonoscopy. The patient has never had a colonoscopy in the past.  The patient denies having any complaints such as melena, hematochezia, abdominal pain or distention, change in her bowel movement consistency or frequency, no changes in her weight recently.  No family history of colorectal cancer.   Past Medical History:  Diagnosis Date  . Allergic rhinitis   . Arthritis    knees,  right shoulder ac joint  . History of exercise stress test 10-09-2001 in epic   normal  . HTN (hypertension)   . OSA on CPAP   . Shoulder impingement, right   . Type 2 diabetes mellitus ([place])    followed by pcp  . Wears glasses     Past Surgical History:  Procedure Laterality Date  . KNEE SURGERY Left x4  last one 1996  . LAPAROSCOPIC CHOLECYSTECTOMY  09-20-2007  @[facility]   . SHOULDER ARTHROSCOPY WITH ROTATOR CUFF REPAIR Right 10/17/2018   Procedure: Right shoulder arthroscopy with subacromial decompression, distal clavicle resection,  rotator cuff DEBRIEDMENT, LABRIAL DEBRIDMENT;  Surgeon: Justice Britain, MD;  Location: [place];  Service: Orthopedics;  Laterality: Right;  170min    Family History  Problem Relation Age of Onset  . Hypertension Father   . Asthma Mother   . Allergic rhinitis Neg Hx   . Angioedema Neg Hx   . Atopy Neg Hx   . Eczema Neg Hx   . Immunodeficiency Neg Hx   . Urticaria Neg Hx    Social History:  reports that he has never smoked. He has never used smokeless tobacco. He reports current alcohol use. He reports that he does not use drugs.  Allergies:  Allergies  Allergen Reactions  . Hydrocodone Hives  . Lotrel [Amlodipine Besy-Benazepril Hcl] Other (See Comments)    lethargic  . Tomato Hives  . Lisinopril Rash  . Valsartan Rash    Medications Prior to Admission   Medication Sig Dispense Refill  . cholestyramine (QUESTRAN) 4 GM/DOSE powder USE 1/2 SCOOP TWICE DAILY AS DIRECTED. (Patient taking differently: Take 2 g by mouth in the morning.) 378 g 5  . glipiZIDE (GLUCOTROL) 5 MG tablet Take 1 tablet (5 mg total) by mouth 2 (two) times daily before a meal. Getting meds from [place]. 60 tablet 0  . indomethacin (INDOCIN) 25 MG capsule Take 1 capsule (25 mg total) by mouth 2 (two) times daily with a meal. 28 capsule 1  . metFORMIN (GLUCOPHAGE) 1000 MG tablet Take 1 tablet (1,000 mg total) by mouth 2 (two) times daily with a meal. 180 tablet 1  . verapamil (CALAN-SR) 240 MG CR tablet Take 1 tablet (240 mg total) by mouth daily. (Patient taking differently: Take 240 mg by mouth in the morning.) 30 tablet 5  . ibuprofen (ADVIL) 800 MG tablet Take 1 tablet (800 mg total) by mouth every 8 (eight) hours as needed. (Patient not taking: Reported on 07/31/2020) 90 tablet 1    Results for orders placed or performed during the hospital encounter of 08/11/20 (from the past 48 hour(s))  SARS CORONAVIRUS 2 (TAT 6-24 HRS) Nasopharyngeal Nasopharyngeal Swab     Status: None   Collection Time: 08/11/20  3:50 PM   Specimen: Nasopharyngeal Swab  Result Value Ref Range   SARS Coronavirus 2 NEGATIVE NEGATIVE    Comment: (NOTE) SARS-CoV-2  target nucleic acids are NOT DETECTED.  The SARS-CoV-2 RNA is generally detectable in upper and lower respiratory specimens during the acute phase of infection. Negative results do not preclude SARS-CoV-2 infection, do not rule out co-infections with other pathogens, and should not be used as the sole basis for treatment or other patient management decisions. Negative results must be combined with clinical observations, patient history, and epidemiological information. The expected result is Negative.  Fact Sheet for Patients: SugarRoll.be  Fact Sheet for Healthcare  Providers: https://www.woods-mathews.com/  This test is not yet approved or cleared by the [place] FDA and  has been authorized for detection and/or diagnosis of SARS-CoV-2 by FDA under an Emergency Use Authorization (EUA). This EUA will remain  in effect (meaning this test can be used) for the duration of the COVID-19 declaration under Se ction 564(b)(1) of the Act, 21 U.S.C. section 360bbb-3(b)(1), unless the authorization is terminated or revoked sooner.  Performed at [facility], [place] [address] [place], [place] [address]    No results found.  Review of Systems  Constitutional: Negative.   HENT: Negative.   Eyes: Negative.   Respiratory: Negative.   Cardiovascular: Negative.   Gastrointestinal: Negative.   Endocrine: Negative.   Genitourinary: Negative.   Musculoskeletal: Negative.   Skin: Negative.   Allergic/Immunologic: Negative.   Neurological: Negative.   Hematological: Negative.   Psychiatric/Behavioral: Negative.     HR 72 RR 16 O2 sat 96% Physical Exam  GENERAL: The patient is AO x3, in no acute distress. Obese. HEENT: Head is normocephalic and atraumatic. EOMI are intact. Mouth is well hydrated and without lesions. NECK: Supple. No masses LUNGS: Clear to auscultation. No presence of rhonchi/wheezing/rales. Adequate chest expansion HEART: RRR, normal s1 and s2. ABDOMEN: Soft, nontender, no guarding, no peritoneal signs, and nondistended. BS +. No masses. EXTREMITIES: Without any cyanosis, clubbing, rash, lesions or edema. NEUROLOGIC: AOx3, no focal motor deficit. SKIN: no jaundice, no rashes  Assessment/Plan 55 year old male with past medical history of arthritis, hypertension, OSA and type 2 diabetes, coming for screening colonoscopy. The patient is at average risk for colorectal cancer.  We will proceed with colonoscopy today.   Harvel Quale, MD 08/13/2020, 8:06 AM

## 2020-08-13 NOTE — Anesthesia Postprocedure Evaluation (Signed)
Anesthesia Post Note  Patient: Adam Hahn  Procedure(s) Performed: COLONOSCOPY WITH PROPOFOL (N/A ) POLYPECTOMY  Anesthesia Type: MAC Level of consciousness: awake and alert, oriented and patient cooperative Pain management: pain level controlled Vital Signs Assessment: post-procedure vital signs reviewed and stable Respiratory status: spontaneous breathing and respiratory function stable Cardiovascular status: stable and blood pressure returned to baseline Postop Assessment: no apparent nausea or vomiting Anesthetic complications: no   No complications documented.   Last Vitals:  Vitals:   08/13/20 0800 08/13/20 0910  BP: 132/87 100/63  Pulse: 72   Resp: 16 15  Temp: 36.6 C 36.7 C  SpO2: 98% 96%    Last Pain:  Vitals:   08/13/20 0910  TempSrc: Oral  PainSc: 0-No pain                 Luther Newhouse, Arther Dames

## 2020-08-21 DIAGNOSIS — E119 Type 2 diabetes mellitus without complications: Secondary | ICD-10-CM | POA: Diagnosis not present

## 2020-08-21 DIAGNOSIS — Z135 Encounter for screening for eye and ear disorders: Secondary | ICD-10-CM | POA: Diagnosis not present

## 2020-08-25 LAB — SURGICAL PATHOLOGY

## 2020-08-26 ENCOUNTER — Encounter (INDEPENDENT_AMBULATORY_CARE_PROVIDER_SITE_OTHER): Payer: Self-pay | Admitting: *Deleted

## 2020-08-27 ENCOUNTER — Encounter (HOSPITAL_COMMUNITY): Payer: Self-pay | Admitting: Gastroenterology

## 2020-09-24 ENCOUNTER — Other Ambulatory Visit: Payer: Self-pay | Admitting: Family Medicine

## 2020-09-24 NOTE — Telephone Encounter (Signed)
Lab Results  Component Value Date   HGBA1C 6.5 (H) 05/28/2020    Lab Results  Component Value Date   CREATININE 1.17 05/28/2020     Lab Results  Component Value Date   CHOL 160 05/28/2020   HDL 42 05/28/2020   LDLCALC 94 05/28/2020   TRIG 136 05/28/2020   CHOLHDL 3.8 05/28/2020     BP Readings from Last 3 Encounters:  08/13/20 100/63  07/28/20 (!) 156/100  07/14/20 119/79

## 2020-10-10 ENCOUNTER — Encounter: Payer: Self-pay | Admitting: Family Medicine

## 2020-10-10 ENCOUNTER — Other Ambulatory Visit: Payer: Self-pay

## 2020-10-10 ENCOUNTER — Ambulatory Visit: Payer: BC Managed Care – PPO | Admitting: Family Medicine

## 2020-10-10 VITALS — BP 130/95 | HR 73 | Temp 97.3°F | Ht 73.0 in | Wt 285.0 lb

## 2020-10-10 DIAGNOSIS — E119 Type 2 diabetes mellitus without complications: Secondary | ICD-10-CM | POA: Diagnosis not present

## 2020-10-10 DIAGNOSIS — I10 Essential (primary) hypertension: Secondary | ICD-10-CM | POA: Diagnosis not present

## 2020-10-10 MED ORDER — AZITHROMYCIN 250 MG PO TABS: ORAL_TABLET | ORAL | 0 refills | Status: AC

## 2020-10-10 MED ORDER — CHOLESTYRAMINE 4 GM/DOSE PO POWD: ORAL | 5 refills | Status: DC

## 2020-10-10 NOTE — Progress Notes (Signed)
Patient ID: Adam Hahn, male    DOB: 04/13/1966, 55 y.o.   MRN: [id number]   Chief Complaint  Patient presents with   Hypertension   Subjective:    HPI HTN-6 month follow up.   Took bp med right before the appt, hasn't had in the system long.  Dm2- Compliant with medications. Checking blood glucose.   Not seeing any high or low numbers.  Denies polyuria or polydipsia.  Eye-exam, [facility]- no retinopathy per pt. In 2022. No foot concerns.  URI- symptoms.  Congestion in nose.   Mucinex and flonase.  1 wk now. No pain in face or fever.  Has h/o allergies.  More outside recently. Loratadine. Feeling it's linger.   Pt stating has been getting his dm and htn meds from the [place].  Occasionally getting them from [facility].   Medical History Adam Hahn has a past medical history of Allergic rhinitis, Arthritis, History of exercise stress test (10-09-2001 in epic), HTN (hypertension), OSA on CPAP, Shoulder impingement, right, Type 2 diabetes mellitus ([place]), and Wears glasses.   Outpatient Encounter Medications as of 10/10/2020  Medication Sig   [EXPIRED] azithromycin (ZITHROMAX) 250 MG tablet Take 2 tablets on day 1, then 1 tablet daily on days 2 through 5   cholestyramine (QUESTRAN) 4 GM/DOSE powder USE 1/2 SCOOP once DAILY AS DIRECTED.   glipiZIDE (GLUCOTROL) 5 MG tablet Take 1 tablet (5 mg total) by mouth 2 (two) times daily before a meal. Getting meds from [place].   indomethacin (INDOCIN) 25 MG capsule Take 1 capsule (25 mg total) by mouth 2 (two) times daily with a meal.   metFORMIN (GLUCOPHAGE) 1000 MG tablet TAKE (1) TABLET BY MOUTH TWICE A DAY WITH MEALS (BREAKFAST AND SUPPER).   verapamil (CALAN-SR) 240 MG CR tablet Take 1 tablet (240 mg total) by mouth daily. (Patient taking differently: Take 240 mg by mouth in the morning.)   [DISCONTINUED] cholestyramine (QUESTRAN) 4 GM/DOSE powder USE 1/2 SCOOP TWICE DAILY AS DIRECTED. (Patient taking differently: Take 2 g by mouth  in the morning.)   No facility-administered encounter medications on file as of 10/10/2020.     Review of Systems  Constitutional:  Negative for chills and fever.  HENT:  Positive for congestion. Negative for rhinorrhea and sore throat.   Respiratory:  Negative for cough, shortness of breath and wheezing.   Cardiovascular:  Negative for chest pain and leg swelling.  Gastrointestinal:  Negative for abdominal pain, diarrhea, nausea and vomiting.  Genitourinary:  Negative for dysuria and frequency.  Skin:  Negative for rash.  Neurological:  Negative for dizziness, weakness and headaches.    Vitals BP (!) 130/95   Pulse 73   Temp (!) 97.3 F (36.3 C)   Ht '6\' 1"'  (1.854 m)   Wt 285 lb (129.3 kg)   SpO2 100%   BMI 37.60 kg/m   Objective:   Physical Exam Vitals and nursing note reviewed.  Constitutional:      General: He is not in acute distress.    Appearance: Normal appearance. He is not ill-appearing.  HENT:     Head: Normocephalic.     Nose: Nose normal. No congestion or rhinorrhea.     Mouth/Throat:     Mouth: Mucous membranes are moist.     Pharynx: No oropharyngeal exudate or posterior oropharyngeal erythema.  Eyes:     Extraocular Movements: Extraocular movements intact.     Conjunctiva/sclera: Conjunctivae normal.     Pupils: Pupils are equal, round,  and reactive to light.  Cardiovascular:     Rate and Rhythm: Normal rate and regular rhythm.     Pulses: Normal pulses.     Heart sounds: Normal heart sounds. No murmur heard. Pulmonary:     Effort: Pulmonary effort is normal.     Breath sounds: Normal breath sounds. No wheezing, rhonchi or rales.  Musculoskeletal:        General: Normal range of motion.     Right lower leg: No edema.     Left lower leg: No edema.  Skin:    General: Skin is warm and dry.     Findings: No rash.  Neurological:     General: No focal deficit present.     Mental Status: He is alert and oriented to person, place, and time.      Cranial Nerves: No cranial nerve deficit.  Psychiatric:        Mood and Affect: Mood normal.        Behavior: Behavior normal.        Thought Content: Thought content normal.        Judgment: Judgment normal.     Assessment and Plan   1. Type 2 diabetes mellitus without complication, without long-term current use of insulin (HCC) - CMP14+EGFR - Hemoglobin A1c - Microalbumin, urine  2. Primary hypertension   Htn- suboptimal. Pt stating just took med this am prior to appt, not been in system long.  Has elevated diastolic bp.  Cont to monitor. Concern pt may not be taking daily, since last refill on verapamil was 2/21 with 36[place]fill.   [place]  Dm2- , stable, cont meds.  labs given.  Pt to get eye exam.   Return in about 6 months (around 04/11/2021) for f/u htn, dm2.

## 2020-10-11 LAB — CMP14+EGFR
ALT: 38 IU/L (ref 0–44)
AST: 22 IU/L (ref 0–40)
Albumin/Globulin Ratio: 1.5 (ref 1.2–2.2)
Albumin: 4.7 g/dL (ref 3.8–4.9)
Alkaline Phosphatase: 89 IU/L (ref 44–121)
BUN/Creatinine Ratio: 13 (ref 9–20)
BUN: 16 mg/dL (ref 6–24)
Bilirubin Total: 0.4 mg/dL (ref 0.0–1.2)
CO2: 24 mmol/L (ref 20–29)
Calcium: 10.2 mg/dL (ref 8.7–10.2)
Chloride: 99 mmol/L (ref 96–106)
Creatinine, Ser: 1.27 mg/dL (ref 0.76–1.27)
Globulin, Total: 3.2 g/dL (ref 1.5–4.5)
Glucose: 137 mg/dL — ABNORMAL HIGH (ref 65–99)
Potassium: 5.1 mmol/L (ref 3.5–5.2)
Sodium: 139 mmol/L (ref 134–144)
Total Protein: 7.9 g/dL (ref 6.0–8.5)
eGFR: 68 mL/min/{1.73_m2} (ref >59)

## 2020-10-11 LAB — HEMOGLOBIN A1C
Est. average glucose Bld gHb Est-mCnc: 166 mg/dL
Hgb A1c MFr Bld: 7.4 % — ABNORMAL HIGH (ref 4.8–5.6)

## 2020-10-11 LAB — MICROALBUMIN, URINE: Microalbumin, Urine: 4.5 ug/mL

## 2020-10-13 ENCOUNTER — Other Ambulatory Visit: Payer: Self-pay

## 2020-10-13 ENCOUNTER — Encounter (HOSPITAL_COMMUNITY): Payer: Self-pay | Admitting: Physical Therapy

## 2020-10-13 ENCOUNTER — Ambulatory Visit (HOSPITAL_COMMUNITY): Payer: No Typology Code available for payment source | Attending: Orthopedic Surgery | Admitting: Physical Therapy

## 2020-10-13 DIAGNOSIS — R29898 Other symptoms and signs involving the musculoskeletal system: Secondary | ICD-10-CM | POA: Diagnosis present

## 2020-10-13 DIAGNOSIS — M6281 Muscle weakness (generalized): Secondary | ICD-10-CM

## 2020-10-13 DIAGNOSIS — M25612 Stiffness of left shoulder, not elsewhere classified: Secondary | ICD-10-CM

## 2020-10-13 DIAGNOSIS — M25512 Pain in left shoulder: Secondary | ICD-10-CM | POA: Diagnosis not present

## 2020-10-13 NOTE — Patient Instructions (Signed)
Access Code: [email or web address] URL: [email or web address] Date: 10/13/2020 Prepared by: [facility] Adam Hahn  Exercises Circular Shoulder Pendulum with Table Support - 3 x daily - 7 x weekly Seated Shoulder Flexion Towel Slide at Table Top - 3 x daily - 7 x weekly - 1 sets - 10 reps Seated Shoulder Abduction Towel Slide at Table Top - 3 x daily - 7 x weekly - 1 sets - 10 reps

## 2020-10-13 NOTE — Therapy (Signed)
Adam Hahn, [place], [address] Phone: [phone]   Fax:  [phone]  Physical Therapy Evaluation  Patient Details  Name: Adam Hahn MRN: [id number] Date of Birth: [birth year] Referring Provider (PT): Justice Britain MD   Encounter Date: 10/13/2020   PT End of Session - 10/13/20 1122     Visit Number 1    Number of Visits 24    Date for PT Re-Evaluation 01/05/21    Authorization Type VA commuity care (15 visits approved - will need to request more visits)    Authorization - Visit Number 1    Authorization - Number of Visits 15    PT Start Time 1042    PT Stop Time 1116    PT Time Calculation (min) 34 min    Activity Tolerance Patient tolerated treatment well    Behavior During Therapy [facility] for tasks assessed/performed             Past Medical History:  Diagnosis Date   Allergic rhinitis    Arthritis    knees,  right shoulder ac joint   History of exercise stress test 10-09-2001 in epic   normal   HTN (hypertension)    OSA on CPAP    Shoulder impingement, right    Type 2 diabetes mellitus ([place])    followed by pcp   Wears glasses     Past Surgical History:  Procedure Laterality Date   COLONOSCOPY WITH PROPOFOL N/A 08/13/2020   Procedure: COLONOSCOPY WITH PROPOFOL;  Surgeon: Harvel Quale, MD;  [facility];  Service: Gastroenterology;  Laterality: N/A;  am   KNEE SURGERY Left x4  last one [place]  09-20-2007  @[facility]    POLYPECTOMY  08/13/2020   Procedure: POLYPECTOMY;  Surgeon: Harvel Quale, MD;  [facility];  Service: Gastroenterology;;   SHOULDER ARTHROSCOPY WITH ROTATOR CUFF REPAIR Right 10/17/2018   Procedure: Right shoulder arthroscopy with subacromial decompression, distal clavicle resection,  rotator cuff DEBRIEDMENT, LABRIAL DEBRIDMENT;  Surgeon: Justice Britain, MD;  Location: [place];  Service:  Orthopedics;  Laterality: Right;  113min    There were no vitals filed for this visit.    Subjective Assessment - 10/13/20 1041     Subjective Patient is a  55 y.o. male presenting to physical therapy with c/o L shoulder pain. Patient states R rotator cuff repair in 2020. He had labral repair and RCR and removal of bone spurs on June 3rd. He has been getting a lot of sharp pains in his shoulder blade. He has been using ice. Patient's main goal is to get back to normal, strength, ROM.    Limitations Lifting;House hold activities    Patient Stated Goals get back to normal, strength, ROM    Currently in Pain? Yes    Pain Score 7     Pain Location Shoulder    Pain Orientation Left    Pain Descriptors / Indicators Sharp    Pain Type Surgical pain    Pain Onset 1 to 4 weeks ago    Pain Frequency Constant                [facility] PT Assessment - 10/13/20 0001       Assessment   Medical Diagnosis Impingement syndrome of L shoulder/ L RCR, SAD, DCR    Referring Provider (PT) Justice Britain MD    Onset Date/Surgical Date 10/03/20    Next  MD Visit Today    Prior Therapy R RCR      Precautions   Precautions Shoulder    Type of Shoulder Precautions Patient states LRCR, referral states SAD, DCR      Restrictions   Weight Bearing Restrictions Yes    LUE Weight Bearing Non weight bearing      Balance Screen   Has the patient fallen in the past 6 months No    Has the patient had a decrease in activity level because of a fear of falling?  No    Is the patient reluctant to leave their home because of a fear of falling?  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      Cognition   Overall Cognitive Status Within Functional Limits for tasks assessed      Observation/Other Assessments   Observations Sling LUE    Focus on Therapeutic Outcomes (FOTO)  27% function      ROM / Strength   AROM /  PROM / Strength PROM;AROM      AROM   Overall AROM  Unable to assess;Due to precautions    Overall AROM Comments R UE WFL      PROM   Overall PROM Comments c/o pain at end ranges    PROM Assessment Site Shoulder    Right/Left Shoulder Left    Left Shoulder Flexion 110 Degrees    Left Shoulder ABduction 70 Degrees    Left Shoulder External Rotation 0 Degrees      Palpation   Palpation comment TTP grossly throughout L shoulder                        Objective measurements completed on examination: See above findings.       [facility] Adult PT Treatment/Exercise - 10/13/20 0001       Exercises   Exercises Shoulder      Shoulder Exercises: Seated   Other Seated Exercises table slides flexion and abduction PROM x10      Shoulder Exercises: Standing   Other Standing Exercises pendulum 2 minutes                    PT Education - 10/13/20 1041     Education Details Patient educated on exam findings, POC, scope of PT, HEP    Person(s) Educated Patient    Methods Explanation;Demonstration    Comprehension Verbalized understanding;Returned demonstration              PT Short Term Goals - 10/13/20 1156       PT SHORT TERM GOAL #1   Title Patient will be independent with HEP in order to improve functional outcomes.    Time 6    Period Weeks    Status New    Target Date 11/24/20      PT SHORT TERM GOAL #2   Title Patient will report at least 25% improvement in symptoms for improved quality of life.    Time 6    Period Weeks    Status New    Target Date 11/24/20               PT Long Term Goals - 10/13/20 1158       PT LONG TERM GOAL #1   Title Patient will report at least 75% improvement in symptoms for  improved quality of life.    Time 12    Period Weeks    Status New    Target Date 01/05/21      PT LONG TERM GOAL #2   Title Patient will demonstrate full L shoulder AROM in order to complete overhead tasks.    Time 12     Period Weeks    Status New    Target Date 01/05/21      PT LONG TERM GOAL #3   Title Patient will demonstrate 5/5 L shoulder strength in order to be able to lift objects at work.    Time 12    Period Weeks    Status New    Target Date 01/05/21                    Plan - 10/13/20 1149     Clinical Impression Statement Patient is a  55 y.o. male presenting to physical therapy with c/o L shoulder pain, DOS 10/03/20. Reached out to MD on clarification of surgical intervention performed. He presents with pain limited deficits in L shoulder strength, ROM, endurance, and functional mobility with ADL. He is having to modify and restrict ADL as indicated by FOTO score as well as subjective information and objective measures which is affecting overall participation. Patient will benefit from skilled physical therapy in order to improve function and reduce impairment.    Personal Factors and Comorbidities Comorbidity 3+;Time since onset of injury/illness/exacerbation;Fitness    Comorbidities Arthritis, Back pain, BMI over 30, Diabetes II, High Blood Pressure    Examination-Activity Limitations Reach Overhead;Bend;Lift;Carry;Caring for Others;Hygiene/Grooming;Dressing    Examination-Participation Restrictions Cleaning;Occupation;Meal Prep;Driving;Community Activity;Laundry;Volunteer;Shop    Stability/Clinical Decision Making Stable/Uncomplicated    Clinical Decision Making Low    Rehab Potential Good    PT Frequency Other (comment)   1-2x/week   PT Duration 12 weeks    PT Treatment/Interventions ADLs/Self Care Home Management;Aquatic Therapy;Cryotherapy;Electrical Stimulation;Iontophoresis 4mg /ml Dexamethasone;Moist Heat;Traction;Ultrasound;DME Instruction;Gait training;Stair training;Functional mobility training;Therapeutic activities;Therapeutic exercise;Balance training;Neuromuscular re-education;Patient/family education;Manual techniques;Orthotic Fit/Training;Compression bandaging;Dry  needling;Energy conservation;Splinting;Taping;Vasopneumatic Device;Spinal Manipulations;Joint Manipulations;Scar mobilization;Passive range of motion    PT Next Visit Plan await response from MD regarding surgical intervention performed and progress per protocol    PT Home Exercise Plan table slides flexion, abduction, pendulums    Consulted and Agree with Plan of Care Patient             Patient will benefit from skilled therapeutic intervention in order to improve the following deficits and impairments:  Decreased range of motion, Impaired UE functional use, Increased muscle spasms, Decreased activity tolerance, Decreased knowledge of precautions, Pain, Hypomobility, Impaired flexibility, Improper body mechanics, Decreased mobility, Decreased strength  Visit Diagnosis: Left shoulder pain, unspecified chronicity  Muscle weakness (generalized)  Other symptoms and signs involving the musculoskeletal system  Stiffness of left shoulder, not elsewhere classified     Problem List Patient Active Problem List   Diagnosis Date Noted   Tonsillar exudate 06/16/2020   Strep sore throat 06/16/2020   Acute rhinosinusitis 02/29/2020   Newly diagnosed diabetes ([place]) 01/13/2017   Type 2 diabetes mellitus without complications ([place]) 47/42/5956   Prediabetes 05/24/2016   Patellar tendinitis of left knee 12/11/2013   Morbid obesity ([place]) 05/09/2013   Osteoarthritis of left knee 11/09/2012   Obstructive sleep apnea 09/11/2012   HTN (hypertension) 09/11/2012   Impaired fasting glucose 09/11/2012   Degenerative arthritis of left knee 06/03/2011   S/P lateral meniscectomy of left knee 06/03/2011   BACK PAIN 05/01/2009  DERANGEMENT MENISCUS 09/04/2007   ELBOW PAIN, BILATERAL 04/06/2007   DEGENERATIVE JOINT DISEASE, KNEE 03/13/2007   Pain in joint, lower leg 03/13/2007    12:03 PM, 10/13/20 Mearl Latin PT, DPT Physical Therapist at [place], [place], [address] Phone: [phone]   Fax:  [phone]  Name: LANDER ESLICK MRN: [id number] Date of Birth: December 01, 1965

## 2020-10-15 ENCOUNTER — Telehealth: Payer: Self-pay | Admitting: Family Medicine

## 2020-10-15 ENCOUNTER — Encounter (HOSPITAL_COMMUNITY): Payer: BC Managed Care – PPO

## 2020-10-15 ENCOUNTER — Telehealth: Payer: Self-pay

## 2020-10-15 NOTE — Telephone Encounter (Signed)
Result note routed directly to one nurse: Erven Colla, DO  Vicente Males, LPN Im not sure which medication he is talking about.  He's on glipizide and metformin.   If he can let me know which one he's requesting we can look into it.   At this time I would think if he watched his carb intake more then we could avoid adding another medication.   Dr. Lovena Le   Please see result note (may have to look in chart since routed directly to me)

## 2020-10-15 NOTE — Telephone Encounter (Signed)
Cale returning [place] phone call   Pt call back [phone]

## 2020-10-15 NOTE — Telephone Encounter (Signed)
Called and discussed with pt. He states the medication is tirzepapide he was asking about. Pt is aware dr Lovena Le out of office this week.

## 2020-10-15 NOTE — Telephone Encounter (Signed)
Discussed with pt. Pt verbalized understanding. And then med he was asking about was tirzepatide. Pt aware that dr Lovena Le out of office this week.

## 2020-10-20 ENCOUNTER — Ambulatory Visit (HOSPITAL_COMMUNITY): Payer: No Typology Code available for payment source | Admitting: Physical Therapy

## 2020-10-20 NOTE — Telephone Encounter (Signed)
Patient returned call and was informed per drs notes , he verbalizes understanding.

## 2020-10-20 NOTE — Telephone Encounter (Signed)
Left message for a return call for details

## 2020-10-21 ENCOUNTER — Ambulatory Visit (HOSPITAL_COMMUNITY): Payer: No Typology Code available for payment source | Admitting: Physical Therapy

## 2020-10-21 ENCOUNTER — Other Ambulatory Visit: Payer: Self-pay

## 2020-10-21 ENCOUNTER — Encounter (HOSPITAL_COMMUNITY): Payer: Self-pay | Admitting: Physical Therapy

## 2020-10-21 DIAGNOSIS — M6281 Muscle weakness (generalized): Secondary | ICD-10-CM

## 2020-10-21 DIAGNOSIS — M25512 Pain in left shoulder: Secondary | ICD-10-CM | POA: Diagnosis not present

## 2020-10-21 DIAGNOSIS — M25612 Stiffness of left shoulder, not elsewhere classified: Secondary | ICD-10-CM

## 2020-10-21 DIAGNOSIS — R29898 Other symptoms and signs involving the musculoskeletal system: Secondary | ICD-10-CM

## 2020-10-21 NOTE — Therapy (Signed)
[place], [place], [address] Phone: [phone]   Fax:  [phone]  Physical Therapy Treatment  Patient Details  Name: Adam Hahn MRN: [id number] Date of Birth: 1966-03-09 Referring Provider (PT): Justice Britain MD   Encounter Date: 10/21/2020   PT End of Session - 10/21/20 1358     Visit Number 2    Number of Visits 24    Date for PT Re-Evaluation 01/05/21    Authorization Type VA commuity care (15 visits approved - will need to request more visits)    Authorization - Visit Number 2    Authorization - Number of Visits 15    PT Start Time 1400    PT Stop Time 1440    PT Time Calculation (min) 40 min    Activity Tolerance Patient tolerated treatment well    Behavior During Therapy [facility] for tasks assessed/performed             Past Medical History:  Diagnosis Date   Allergic rhinitis    Arthritis    knees,  right shoulder ac joint   History of exercise stress test 10-09-2001 in epic   normal   HTN (hypertension)    OSA on CPAP    Shoulder impingement, right    Type 2 diabetes mellitus ([place])    followed by pcp   Wears glasses     Past Surgical History:  Procedure Laterality Date   COLONOSCOPY WITH PROPOFOL N/A 08/13/2020   Procedure: COLONOSCOPY WITH PROPOFOL;  Surgeon: Harvel Quale, MD;  [facility];  Service: Gastroenterology;  Laterality: N/A;  am   KNEE SURGERY Left x4  last one 1996   LAPAROSCOPIC CHOLECYSTECTOMY  09-20-2007  '@[facility]'    POLYPECTOMY  08/13/2020   Procedure: POLYPECTOMY;  Surgeon: Harvel Quale, MD;  [facility];  Service: Gastroenterology;;   SHOULDER ARTHROSCOPY WITH ROTATOR CUFF REPAIR Right 10/17/2018   Procedure: Right shoulder arthroscopy with subacromial decompression, distal clavicle resection,  rotator cuff DEBRIEDMENT, LABRIAL DEBRIDMENT;  Surgeon: Justice Britain, MD;  Location: [place];  Service:  Orthopedics;  Laterality: Right;  151mn    There were no vitals filed for this visit.   Subjective Assessment - 10/21/20 1359     Subjective Patient states his shoulder has been getting there. His shoulder is still giving him a painful. He has been working on [employer] He feels tightness in his shoulder. Pain is stabbing.    Limitations Lifting;House hold activities    Patient Stated Goals get back to normal, strength, ROM    Currently in Pain? Yes    Pain Score 5     Pain Location Shoulder    Pain Orientation Left    Pain Descriptors / Indicators Sharp    Pain Type Surgical pain    Pain Onset 1 to 4 weeks ago                [facility]       PROM   Left Shoulder Flexion 138 Degrees    Left Shoulder External Rotation 33 Degrees                           [facility] Adult PT Treatment/Exercise - 10/21/20 0001       Shoulder Exercises: Supine   Other Supine Exercises shoulder flexion AAROM with stick 2x 10, ER 2x 10  Shoulder Exercises: Standing   Other Standing Exercises RC iso 5 x 10 second holds flexion, abduction, extension    Other Standing Exercises wall walk x 10 flexion, x 10 abduction; scap ret/ shoulder rolls 2x 15                    PT Education - 10/21/20 1359     Education Details HEP, shoulder anatomy, procedue completed    Person(s) Educated Patient    Methods Explanation;Demonstration    Comprehension Verbalized understanding;Returned demonstration              PT Short Term Goals - 10/13/20 1156       PT SHORT TERM GOAL #1   Title Patient will be independent with HEP in order to improve functional outcomes.    Time 6    Period Weeks    Status New    Target Date 11/24/20      PT SHORT TERM GOAL #2   Title Patient will report at least 25% improvement in symptoms for improved quality of life.    Time 6    Period Weeks    Status New    Target Date 11/24/20               PT Long  Term Goals - 10/13/20 1158       PT LONG TERM GOAL #1   Title Patient will report at least 75% improvement in symptoms for improved quality of life.    Time 12    Period Weeks    Status New    Target Date 01/05/21      PT LONG TERM GOAL #2   Title Patient will demonstrate full L shoulder AROM in order to complete overhead tasks.    Time 12    Period Weeks    Status New    Target Date 01/05/21      PT LONG TERM GOAL #3   Title Patient will demonstrate 5/5 L shoulder strength in order to be able to lift objects at work.    Time 12    Period Weeks    Status New    Target Date 01/05/21                   Plan - 10/21/20 1358     Clinical Impression Statement Patient s/p L SA, SAD, DCR on 6/3. Per protocol patient allowed immediate ROM and strength, RC and scap stabilization program, and goal is 80-90% motion of opposite shoulder in 2-3 weeks. (protocol in book). Patient showing improving ROM in flexion and ER today. Began AAROM today with supine and wall walk exercises. Patient tolerates addition of gentle isometrics. Patient will continue to benefit from skilled physical therapy in order to reduce impairment and improve function.    Personal Factors and Comorbidities Comorbidity 3+;Time since onset of injury/illness/exacerbation;Fitness    Comorbidities Arthritis, Back pain, BMI over 30, Diabetes II, High Blood Pressure    Examination-Activity Limitations Reach Overhead;Bend;Lift;Carry;Caring for Others;Hygiene/Grooming;Dressing    Examination-Participation Restrictions Cleaning;Occupation;Meal Prep;Driving;Community Activity;Laundry;Volunteer;Shop    Stability/Clinical Decision Making Stable/Uncomplicated    Rehab Potential Good    PT Frequency Other (comment)   1-2x/week   PT Duration 12 weeks    PT Treatment/Interventions ADLs/Self Care Home Management;Aquatic Therapy;Cryotherapy;Electrical Stimulation;Iontophoresis 80m/ml Dexamethasone;Moist Heat;Traction;Ultrasound;DME  Instruction;Gait training;Stair training;Functional mobility training;Therapeutic activities;Therapeutic exercise;Balance training;Neuromuscular re-education;Patient/family education;Manual techniques;Orthotic Fit/Training;Compression bandaging;Dry needling;Energy conservation;Splinting;Taping;Vasopneumatic Device;Spinal Manipulations;Joint Manipulations;Scar mobilization;Passive range of motion    PT Next Visit Plan per protocol,  immediate ROM and strength, RC and scap stabilizer program, Goal is 80-90% ROM of opposite shoulder by 2-3 weeks; AAROM to AROM as able, Isometrics, periscapular strength    PT Home Exercise Plan table slides flexion, abduction, pendulums; AAROM flexion/ER in supine, wall walk flexion/abduction, scap retraction    Consulted and Agree with Plan of Care Patient             Patient will benefit from skilled therapeutic intervention in order to improve the following deficits and impairments:  Decreased range of motion, Impaired UE functional use, Increased muscle spasms, Decreased activity tolerance, Decreased knowledge of precautions, Pain, Hypomobility, Impaired flexibility, Improper body mechanics, Decreased mobility, Decreased strength  Visit Diagnosis: Left shoulder pain, unspecified chronicity  Muscle weakness (generalized)  Other symptoms and signs involving the musculoskeletal system  Stiffness of left shoulder, not elsewhere classified     Problem List Patient Active Problem List   Diagnosis Date Noted   Tonsillar exudate 06/16/2020   Strep sore throat 06/16/2020   Acute rhinosinusitis 02/29/2020   Newly diagnosed diabetes ([place]) 01/13/2017   Type 2 diabetes mellitus without complications ([place]) 18/98/4210   Prediabetes 05/24/2016   Patellar tendinitis of left knee 12/11/2013   Morbid obesity ([place]) 05/09/2013   Osteoarthritis of left knee 11/09/2012   Obstructive sleep apnea 09/11/2012   HTN (hypertension) 09/11/2012   Impaired fasting glucose  09/11/2012   Degenerative arthritis of left knee 06/03/2011   S/P lateral meniscectomy of left knee 06/03/2011   BACK PAIN 05/01/2009   DERANGEMENT MENISCUS 09/04/2007   ELBOW PAIN, BILATERAL 04/06/2007   DEGENERATIVE JOINT DISEASE, KNEE 03/13/2007   Pain in joint, lower leg 03/13/2007    2:44 PM, 10/21/20 Mearl Latin PT, DPT Physical Therapist at [place], [place], [address] Phone: [phone]   Fax:  [phone]  Name: JAWAUN CELMER MRN: [id number] Date of Birth: [birth year]

## 2020-10-21 NOTE — Patient Instructions (Signed)
Access Code: [email or web address] URL: [email or web address] Date: 10/21/2020 Prepared by: Mitzi Hansen Won Kreuzer  Exercises Supine Shoulder Flexion Extension AAROM with Dowel - 2 x daily - 7 x weekly - 3 sets - 10 reps Supine Shoulder External Rotation with Dowel - 2 x daily - 7 x weekly - 3 sets - 10 reps Seated Scapular Retraction - 2 x daily - 7 x weekly - 2 sets - 15 reps Shoulder Flexion Wall Slide with Towel - 2 x daily - 7 x weekly - 1 sets - 10 reps Standing Shoulder Abduction Slides at Wall - 2 x daily - 7 x weekly - 1 sets - 10 reps

## 2020-10-22 ENCOUNTER — Encounter (HOSPITAL_COMMUNITY): Payer: BC Managed Care – PPO | Admitting: Physical Therapy

## 2020-10-27 ENCOUNTER — Other Ambulatory Visit: Payer: Self-pay

## 2020-10-27 ENCOUNTER — Ambulatory Visit (HOSPITAL_COMMUNITY): Payer: No Typology Code available for payment source | Admitting: Physical Therapy

## 2020-10-27 ENCOUNTER — Encounter (HOSPITAL_COMMUNITY): Payer: Self-pay | Admitting: Physical Therapy

## 2020-10-27 DIAGNOSIS — R29898 Other symptoms and signs involving the musculoskeletal system: Secondary | ICD-10-CM

## 2020-10-27 DIAGNOSIS — M25512 Pain in left shoulder: Secondary | ICD-10-CM

## 2020-10-27 DIAGNOSIS — M25612 Stiffness of left shoulder, not elsewhere classified: Secondary | ICD-10-CM

## 2020-10-27 DIAGNOSIS — M6281 Muscle weakness (generalized): Secondary | ICD-10-CM

## 2020-10-27 NOTE — Patient Instructions (Signed)
Access Code: [email or web address] URL: [email or web address] Date: 10/27/2020 Prepared by: [facility] Colletta Spillers  Exercises Standing Isometric Shoulder Internal Rotation at Doorway - 1 x daily - 7 x weekly - 5 reps - 10 second hold Isometric Shoulder External Rotation at Wall - 1 x daily - 7 x weekly - 5 reps - 10 second hold

## 2020-10-27 NOTE — Therapy (Signed)
[employer], [place], [address] Phone: [phone]   Fax:  [phone]  Physical Therapy Treatment  Patient Details  Name: Adam Hahn MRN: [id number] Date of Birth: 1966-02-26 Referring Provider (PT): Justice Britain MD   Encounter Date: 10/27/2020   PT End of Session - 10/27/20 1312     Visit Number 3    Number of Visits 24    Date for PT Re-Evaluation 01/05/21    Authorization Type VA commuity care (15 visits approved - will need to request more visits)    Authorization - Visit Number 3    Authorization - Number of Visits 15    PT Start Time 9672    PT Stop Time 1355    PT Time Calculation (min) 40 min    Activity Tolerance Patient tolerated treatment well    Behavior During Therapy [facility] for tasks assessed/performed             Past Medical History:  Diagnosis Date   Allergic rhinitis    Arthritis    knees,  right shoulder ac joint   History of exercise stress test 10-09-2001 in epic   normal   HTN (hypertension)    OSA on CPAP    Shoulder impingement, right    Type 2 diabetes mellitus ([place])    followed by pcp   Wears glasses     Past Surgical History:  Procedure Laterality Date   COLONOSCOPY WITH PROPOFOL N/A 08/13/2020   Procedure: COLONOSCOPY WITH PROPOFOL;  Surgeon: Harvel Quale, MD;  [facility];  Service: Gastroenterology;  Laterality: N/A;  am   KNEE SURGERY Left x4  last one [place]  09-20-2007  _0    POLYPECTOMY  08/13/2020   Procedure: POLYPECTOMY;  Surgeon: Harvel Quale, MD;  [facility];  Service: Gastroenterology;;   SHOULDER ARTHROSCOPY WITH ROTATOR CUFF REPAIR Right 10/17/2018   Procedure: Right shoulder arthroscopy with subacromial decompression, distal clavicle resection,  rotator cuff DEBRIEDMENT, LABRIAL DEBRIDMENT;  Surgeon: Justice Britain, MD;  Location: [place];  Service:  Orthopedics;  Laterality: Right;  19mn    There were no vitals filed for this visit.   Subjective Assessment - 10/27/20 1313     Subjective Patient states his shoulder locked up yesterday when eating. His exercises are going well.    Limitations Lifting;House hold activities    Patient Stated Goals get back to normal, strength, ROM    Currently in Pain? Yes    Pain Score 6     Pain Location Shoulder    Pain Orientation Left    Pain Type Surgical pain    Pain Onset 1 to 4 weeks ago    Pain Frequency Constant                               [facility] Adult PT Treatment/Exercise - 10/27/20 0001       Shoulder Exercises: Supine   Other Supine Exercises shoulder flexion with green band between hands 1x 10      Shoulder Exercises: Standing   External Rotation Left;10 reps    Theraband Level (Shoulder External Rotation) Level 4 (Blue)    External Rotation Limitations 3 sets    Extension Both;10 reps    Theraband Level (Shoulder Extension) Level 4 (Blue)    Extension Limitations 3 sets    Row 10 reps  Theraband Level (Shoulder Row) Level 4 (Blue)    Row Limitations 3 sets    Other Standing Exercises RC iso 5 x 10 second holds flexion, abduction, extension, ER, IR    Other Standing Exercises wall walk x 10 flexion, x 10 abduction; scap ret/ shoulder rolls 2x 15      Shoulder Exercises: Pulleys   Flexion 3 minutes                    PT Education - 10/27/20 1312     Education Details HEP    Person(s) Educated Patient    Methods Explanation;Demonstration    Comprehension Verbalized understanding;Returned demonstration              PT Short Term Goals - 10/13/20 1156       PT SHORT TERM GOAL #1   Title Patient will be independent with HEP in order to improve functional outcomes.    Time 6    Period Weeks    Status New    Target Date 11/24/20      PT SHORT TERM GOAL #2   Title Patient will report at least 25% improvement in symptoms  for improved quality of life.    Time 6    Period Weeks    Status New    Target Date 11/24/20               PT Long Term Goals - 10/13/20 1158       PT LONG TERM GOAL #1   Title Patient will report at least 75% improvement in symptoms for improved quality of life.    Time 12    Period Weeks    Status New    Target Date 01/05/21      PT LONG TERM GOAL #2   Title Patient will demonstrate full L shoulder AROM in order to complete overhead tasks.    Time 12    Period Weeks    Status New    Target Date 01/05/21      PT LONG TERM GOAL #3   Title Patient will demonstrate 5/5 L shoulder strength in order to be able to lift objects at work.    Time 12    Period Weeks    Status New    Target Date 01/05/21                   Plan - 10/27/20 1312     Clinical Impression Statement Patient with continued intermittent pain with pully but is showing good ROM. Began postural strengthening today without c/o shoulder pain. Patient completes with good mechanics without cueing. Patient stating L posterior scapular pain with shoulder elevation with wall walks. Patient notes fatigue at end of session. Patient will continue to benefit from skilled physical therapy in order to reduce impairment and improve function.    Personal Factors and Comorbidities Comorbidity 3+;Time since onset of injury/illness/exacerbation;Fitness    Comorbidities Arthritis, Back pain, BMI over 30, Diabetes II, High Blood Pressure    Examination-Activity Limitations Reach Overhead;Bend;Lift;Carry;Caring for Others;Hygiene/Grooming;Dressing    Examination-Participation Restrictions Cleaning;Occupation;Meal Prep;Driving;Community Activity;Laundry;Volunteer;Shop    Stability/Clinical Decision Making Stable/Uncomplicated    Rehab Potential Good    PT Frequency Other (comment)   1-2x/week   PT Duration 12 weeks    PT Treatment/Interventions ADLs/Self Care Home Management;Aquatic Therapy;Cryotherapy;Electrical  Stimulation;Iontophoresis 41m/ml Dexamethasone;Moist Heat;Traction;Ultrasound;DME Instruction;Gait training;Stair training;Functional mobility training;Therapeutic activities;Therapeutic exercise;Balance training;Neuromuscular re-education;Patient/family education;Manual techniques;Orthotic Fit/Training;Compression bandaging;Dry needling;Energy conservation;Splinting;Taping;Vasopneumatic Device;Spinal Manipulations;Joint Manipulations;Scar mobilization;Passive range of  motion    PT Next Visit Plan per protocol, immediate ROM and strength, RC and scap stabilizer program, Goal is 80-90% ROM of opposite shoulder by 2-3 weeks; AAROM to AROM as able, Isometrics, periscapular strength    PT Home Exercise Plan table slides flexion, abduction, pendulums; AAROM flexion/ER in supine, wall walk flexion/abduction, scap retraction 6/27 shoulder ER, IR isometrics    Consulted and Agree with Plan of Care Patient             Patient will benefit from skilled therapeutic intervention in order to improve the following deficits and impairments:  Decreased range of motion, Impaired UE functional use, Increased muscle spasms, Decreased activity tolerance, Decreased knowledge of precautions, Pain, Hypomobility, Impaired flexibility, Improper body mechanics, Decreased mobility, Decreased strength  Visit Diagnosis: Left shoulder pain, unspecified chronicity  Muscle weakness (generalized)  Other symptoms and signs involving the musculoskeletal system  Stiffness of left shoulder, not elsewhere classified     Problem List Patient Active Problem List   Diagnosis Date Noted   Tonsillar exudate 06/16/2020   Strep sore throat 06/16/2020   Acute rhinosinusitis 02/29/2020   Newly diagnosed diabetes ([place]) 01/13/2017   Type 2 diabetes mellitus without complications ([place]) 49/70/2637   Prediabetes 05/24/2016   Patellar tendinitis of left knee 12/11/2013   Morbid obesity ([place]) 05/09/2013   Osteoarthritis of left knee  11/09/2012   Obstructive sleep apnea 09/11/2012   HTN (hypertension) 09/11/2012   Impaired fasting glucose 09/11/2012   Degenerative arthritis of left knee 06/03/2011   S/P lateral meniscectomy of left knee 06/03/2011   BACK PAIN 05/01/2009   DERANGEMENT MENISCUS 09/04/2007   ELBOW PAIN, BILATERAL 04/06/2007   DEGENERATIVE JOINT DISEASE, KNEE 03/13/2007   Pain in joint, lower leg 03/13/2007    1:53 PM, 10/27/20 Mearl Latin PT, DPT Physical Therapist at [place], [place], [address] Phone: [phone]   Fax:  [phone]  Name: Adam Hahn MRN: [id number] Date of Birth: 05/13/1965

## 2020-10-29 ENCOUNTER — Encounter (HOSPITAL_COMMUNITY): Payer: Self-pay

## 2020-10-29 ENCOUNTER — Encounter (HOSPITAL_COMMUNITY): Payer: BC Managed Care – PPO | Admitting: Physical Therapy

## 2020-10-29 ENCOUNTER — Ambulatory Visit (HOSPITAL_COMMUNITY): Payer: No Typology Code available for payment source

## 2020-10-29 ENCOUNTER — Telehealth: Payer: Self-pay | Admitting: *Deleted

## 2020-10-29 ENCOUNTER — Other Ambulatory Visit: Payer: Self-pay

## 2020-10-29 DIAGNOSIS — M6281 Muscle weakness (generalized): Secondary | ICD-10-CM

## 2020-10-29 DIAGNOSIS — M25512 Pain in left shoulder: Secondary | ICD-10-CM | POA: Diagnosis not present

## 2020-10-29 DIAGNOSIS — R29898 Other symptoms and signs involving the musculoskeletal system: Secondary | ICD-10-CM

## 2020-10-29 DIAGNOSIS — M25612 Stiffness of left shoulder, not elsewhere classified: Secondary | ICD-10-CM

## 2020-10-29 NOTE — Therapy (Signed)
[place], [place], [address] Phone: [phone]   Fax:  [phone]  Physical Therapy Treatment  Patient Details  Name: Adam Hahn MRN: [id number] Date of Birth: 1966/01/30 Referring Provider (PT): Justice Britain MD   Encounter Date: 10/29/2020   PT End of Session - 10/29/20 1322     Visit Number 4    Number of Visits 24    Date for PT Re-Evaluation 01/05/21    Authorization Type VA commuity care (15 visits approved - will need to request more visits)    Authorization - Visit Number 4    Authorization - Number of Visits 15    PT Start Time 9476    PT Stop Time 1355    PT Time Calculation (min) 40 min    Activity Tolerance Patient tolerated treatment well    Behavior During Therapy [facility] for tasks assessed/performed             Past Medical History:  Diagnosis Date   Allergic rhinitis    Arthritis    knees,  right shoulder ac joint   History of exercise stress test 10-09-2001 in epic   normal   HTN (hypertension)    OSA on CPAP    Shoulder impingement, right    Type 2 diabetes mellitus ([place])    followed by pcp   Wears glasses     Past Surgical History:  Procedure Laterality Date   COLONOSCOPY WITH PROPOFOL N/A 08/13/2020   Procedure: COLONOSCOPY WITH PROPOFOL;  Surgeon: Harvel Quale, MD;  [facility];  Service: Gastroenterology;  Laterality: N/A;  am   KNEE SURGERY Left x4  last one 1996   LAPAROSCOPIC CHOLECYSTECTOMY  09-20-2007  @[facility]    POLYPECTOMY  08/13/2020   Procedure: POLYPECTOMY;  Surgeon: Harvel Quale, MD;  [facility];  Service: Gastroenterology;;   SHOULDER ARTHROSCOPY WITH ROTATOR CUFF REPAIR Right 10/17/2018   Procedure: Right shoulder arthroscopy with subacromial decompression, distal clavicle resection,  rotator cuff DEBRIEDMENT, LABRIAL DEBRIDMENT;  Surgeon: Justice Britain, MD;  Location: [facility];  Service:  Orthopedics;  Laterality: Right;  178min    There were no vitals filed for this visit.   Subjective Assessment - 10/29/20 1317     Subjective Pt stated he has constant dull achey pain 7/10.  Reoprts he uses ices for inflammation and pain control    Patient Stated Goals get back to normal, strength, ROM    Currently in Pain? Yes    Pain Score 7     Pain Location Shoulder                [facility] PT Assessment - 10/29/20 0001       Assessment   Medical Diagnosis Impingement syndrome of L shoulder/ L RCR, SAD, DCR    Referring Provider (PT) Justice Britain MD    Onset Date/Surgical Date 10/03/20    Hand Dominance Right    Next MD Visit 11/19/20    Prior Therapy R RCR      Precautions   Precautions Shoulder    Type of Shoulder Precautions Patient states LRCR, referral states SAD, DCR      PROM   Left Shoulder Flexion 138 Degrees    Left Shoulder ABduction 125 Degrees    Left Shoulder External Rotation 50 Degrees  [facility] Adult PT Treatment/Exercise - 10/29/20 0001       Exercises   Exercises Shoulder      Shoulder Exercises: Seated   Other Seated Exercises PNF D1 5x    Other Seated Exercises Lt UE 3D thoracic excursion flexin and abduction      Shoulder Exercises: Standing   Extension Both;10 reps    Theraband Level (Shoulder Extension) Level 4 (Blue)    Row 10 reps    Theraband Level (Shoulder Row) Level 4 (Blue)    Other Standing Exercises wall walk- able to make it peg 15 then reports increased pain anterior scapula      Shoulder Exercises: Pulleys   Flexion 2 minutes    ABduction 2 minutes      Shoulder Exercises: Therapy Ball   Flexion 10 reps    Flexion Limitations 10" holds end range    ABduction 10 reps    ABduction Limitations 10" holds end range    Right/Left 10 reps                      PT Short Term Goals - 10/13/20 1156       PT SHORT TERM GOAL #1   Title Patient will be independent with  HEP in order to improve functional outcomes.    Time 6    Period Weeks    Status New    Target Date 11/24/20      PT SHORT TERM GOAL #2   Title Patient will report at least 25% improvement in symptoms for improved quality of life.    Time 6    Period Weeks    Status New    Target Date 11/24/20               PT Long Term Goals - 10/13/20 1158       PT LONG TERM GOAL #1   Title Patient will report at least 75% improvement in symptoms for improved quality of life.    Time 12    Period Weeks    Status New    Target Date 01/05/21      PT LONG TERM GOAL #2   Title Patient will demonstrate full L shoulder AROM in order to complete overhead tasks.    Time 12    Period Weeks    Status New    Target Date 01/05/21      PT LONG TERM GOAL #3   Title Patient will demonstrate 5/5 L shoulder strength in order to be able to lift objects at work.    Time 12    Period Weeks    Status New    Target Date 01/05/21                   Plan - 10/29/20 1330     Clinical Impression Statement Session focus with shoulder mobility and strengthening.  Added 3D thoracic excursion wiht UE movements as well as theraball with prolonged stretches at end range to promote mobility.  Pt limited by pain superior scapula during shoulder flexion during wall walks.  Pt tolerated well towards session with good form and mechanics with all new exercises.  Was limited by pain, discussed treatment for pain and edema control.    Personal Factors and Comorbidities Comorbidity 3+;Time since onset of injury/illness/exacerbation;Fitness    Comorbidities Arthritis, Back pain, BMI over 30, Diabetes II, High Blood Pressure    Examination-Activity Limitations Reach Overhead;Bend;Lift;Carry;Caring for Others;Hygiene/Grooming;Dressing    Examination-Participation  Restrictions Cleaning;Occupation;Meal Prep;Driving;Community Activity;Laundry;Volunteer;Shop    Stability/Clinical Decision Making Stable/Uncomplicated     Clinical Decision Making Low    Rehab Potential Good    PT Frequency --   1-2x/week   PT Duration 12 weeks    PT Treatment/Interventions ADLs/Self Care Home Management;Aquatic Therapy;Cryotherapy;Electrical Stimulation;Iontophoresis 4mg /ml Dexamethasone;Moist Heat;Traction;Ultrasound;DME Instruction;Gait training;Stair training;Functional mobility training;Therapeutic activities;Therapeutic exercise;Balance training;Neuromuscular re-education;Patient/family education;Manual techniques;Orthotic Fit/Training;Compression bandaging;Dry needling;Energy conservation;Splinting;Taping;Vasopneumatic Device;Spinal Manipulations;Joint Manipulations;Scar mobilization;Passive range of motion    PT Next Visit Plan per protocol, immediate ROM and strength, RC and scap stabilizer program, Goal is 80-90% ROM of opposite shoulder by 2-3 weeks; AAROM to AROM as able, Isometrics, periscapular strength    PT Home Exercise Plan table slides flexion, abduction, pendulums; AAROM flexion/ER in supine, wall walk flexion/abduction, scap retraction 6/27 shoulder ER, IR isometrics    Consulted and Agree with Plan of Care Patient             Patient will benefit from skilled therapeutic intervention in order to improve the following deficits and impairments:  Decreased range of motion, Impaired UE functional use, Increased muscle spasms, Decreased activity tolerance, Decreased knowledge of precautions, Pain, Hypomobility, Impaired flexibility, Improper body mechanics, Decreased mobility, Decreased strength  Visit Diagnosis: Left shoulder pain, unspecified chronicity  Muscle weakness (generalized)  Other symptoms and signs involving the musculoskeletal system  Stiffness of left shoulder, not elsewhere classified     Problem List Patient Active Problem List   Diagnosis Date Noted   Tonsillar exudate 06/16/2020   Strep sore throat 06/16/2020   Acute rhinosinusitis 02/29/2020   Newly diagnosed diabetes ([place])  01/13/2017   Type 2 diabetes mellitus without complications ([place]) 84/16/6063   Prediabetes 05/24/2016   Patellar tendinitis of left knee 12/11/2013   Morbid obesity ([place]) 05/09/2013   Osteoarthritis of left knee 11/09/2012   Obstructive sleep apnea 09/11/2012   HTN (hypertension) 09/11/2012   Impaired fasting glucose 09/11/2012   Degenerative arthritis of left knee 06/03/2011   S/P lateral meniscectomy of left knee 06/03/2011   BACK PAIN 05/01/2009   DERANGEMENT MENISCUS 09/04/2007   ELBOW PAIN, BILATERAL 04/06/2007   DEGENERATIVE JOINT DISEASE, KNEE 03/13/2007   Pain in joint, lower leg 03/13/2007   Ihor Austin, LPTA/CLT; CBIS [phone]  Aldona Lento 10/29/2020, 2:00 PM  [place] [address] [place], [place], [address] Phone: [phone]   Fax:  [phone]  Name: Adam Hahn MRN: [id number] Date of Birth: Aug 17, 1965

## 2020-10-29 NOTE — Telephone Encounter (Signed)
Pt states he was told to call back if he was ready to try ozempic and he would like it sent in to [facility].

## 2020-10-30 MED ORDER — SEMAGLUTIDE (1 MG/DOSE) 4 MG/3ML ~~LOC~~ SOPN: PEN_INJECTOR | SUBCUTANEOUS | 0 refills | Status: DC

## 2020-10-30 NOTE — Telephone Encounter (Signed)
Pt contacted and verbalized understanding. Pt scheduled for 12/11/20 for follow up

## 2020-10-31 ENCOUNTER — Telehealth: Payer: Self-pay | Admitting: *Deleted

## 2020-10-31 NOTE — Telephone Encounter (Signed)
Patient states he was started on Ozempic and wanted to know if he stops the Metformin and Glipizide and only takes the Ozempic or if he continues the Metformin and Glipizide with the Ozempic   Patient call back number [phone]

## 2020-10-31 NOTE — Telephone Encounter (Signed)
Patient advised per Dr Lovena Le:     Yes, continue with his oral diabetic meds also.   This is to supplement to help with weight loss on the low dose of ozempic.  Have pt cont to check bg 2x per day for first week to see how glucose is going, if he's seeing lows under 70 to eat a snack and call the office.     Patient verbalized understanding.

## 2020-10-31 NOTE — Telephone Encounter (Signed)
Lovena Le, Malena M, DO     Yes, continue with his oral diabetic meds also.   This is to supplement to help with weight loss on the low dose of ozempic.  Have pt cont to check bg 2x per day for first week to see how glucose is going, if he's seeing lows under 70 to eat a snack and call the office.   Thx.   Dr. Lovena Le

## 2020-11-03 MED ORDER — VERAPAMIL HCL ER 240 MG PO TBCR: 240.0000 mg | EXTENDED_RELEASE_TABLET | Freq: Every morning | ORAL | 1 refills | Status: DC

## 2020-11-04 ENCOUNTER — Encounter (HOSPITAL_COMMUNITY): Payer: Self-pay

## 2020-11-04 ENCOUNTER — Other Ambulatory Visit: Payer: Self-pay

## 2020-11-04 ENCOUNTER — Ambulatory Visit (HOSPITAL_COMMUNITY): Payer: No Typology Code available for payment source | Attending: Orthopedic Surgery

## 2020-11-04 DIAGNOSIS — R29898 Other symptoms and signs involving the musculoskeletal system: Secondary | ICD-10-CM

## 2020-11-04 DIAGNOSIS — M25612 Stiffness of left shoulder, not elsewhere classified: Secondary | ICD-10-CM | POA: Diagnosis present

## 2020-11-04 DIAGNOSIS — M6281 Muscle weakness (generalized): Secondary | ICD-10-CM | POA: Insufficient documentation

## 2020-11-04 DIAGNOSIS — M25512 Pain in left shoulder: Secondary | ICD-10-CM

## 2020-11-04 NOTE — Therapy (Signed)
[place], [place], [address] Phone: [phone]   Fax:  [phone]  Physical Therapy Treatment  Patient Details  Name: Adam Hahn MRN: [id number] Date of Birth: April 07, 1966 Referring Provider (PT): Justice Britain MD   Encounter Date: 11/04/2020   PT End of Session - 11/04/20 1026     Visit Number 5    Number of Visits 24    Date for PT Re-Evaluation 01/05/21    Authorization Type VA commuity care (15 visits approved - will need to request more visits)    Authorization - Visit Number 5    Authorization - Number of Visits 15    PT Start Time 1030    PT Stop Time 1115    PT Time Calculation (min) 45 min    Activity Tolerance Patient tolerated treatment well    Behavior During Therapy [facility] for tasks assessed/performed             Past Medical History:  Diagnosis Date   Allergic rhinitis    Arthritis    knees,  right shoulder ac joint   History of exercise stress test 10-09-2001 in epic   normal   HTN (hypertension)    OSA on CPAP    Shoulder impingement, right    Type 2 diabetes mellitus ([place])    followed by pcp   Wears glasses     Past Surgical History:  Procedure Laterality Date   COLONOSCOPY WITH PROPOFOL N/A 08/13/2020   Procedure: COLONOSCOPY WITH PROPOFOL;  Surgeon: Harvel Quale, MD;  [facility];  Service: Gastroenterology;  Laterality: N/A;  am   KNEE SURGERY Left x4  last one 1996   LAPAROSCOPIC CHOLECYSTECTOMY  09-20-2007  @[facility]    POLYPECTOMY  08/13/2020   Procedure: POLYPECTOMY;  Surgeon: Harvel Quale, MD;  [facility];  Service: Gastroenterology;;   SHOULDER ARTHROSCOPY WITH ROTATOR CUFF REPAIR Right 10/17/2018   Procedure: Right shoulder arthroscopy with subacromial decompression, distal clavicle resection,  rotator cuff DEBRIEDMENT, LABRIAL DEBRIDMENT;  Surgeon: Justice Britain, MD;  Location: [place];  Service:  Orthopedics;  Laterality: Right;  175min    There were no vitals filed for this visit.   Subjective Assessment - 11/04/20 1120     Subjective Shoulder is feeling somewhat better, a little sore still, about a 5/10    Patient Stated Goals get back to normal, strength, ROM    Currently in Pain? Yes    Pain Score 5     Pain Location Shoulder    Pain Orientation Left    Pain Descriptors / Indicators Sore    Pain Type Surgical pain                [facility] PT Assessment - 11/04/20 0001       Assessment   Medical Diagnosis Impingement syndrome of L shoulder/ L RCR, SAD, DCR    Referring Provider (PT) Justice Britain MD    Onset Date/Surgical Date 10/03/20    Hand Dominance Right    Next MD Visit 11/19/20    Prior Therapy R RCR                           [facility] Adult PT Treatment/Exercise - 11/04/20 0001       Shoulder Exercises: Supine   External Rotation AAROM;Left;20 reps    Flexion AAROM;Both;20 reps      Shoulder Exercises: Prone  Extension Strengthening;Left;20 reps    Extension Weight (lbs) 1#    Horizontal ABduction 1 Strengthening;Left;20 reps    Horizontal ABduction 1 Weight (lbs) 1#      Shoulder Exercises: Sidelying   External Rotation Strengthening;Left;20 reps;Weights    External Rotation Weight (lbs) 1#      Shoulder Exercises: Standing   Other Standing Exercises wall wash x 20 reps      Shoulder Exercises: Stretch   Other Shoulder Stretches seated upper trap stretch      Manual Therapy   Manual Therapy Soft tissue mobilization    Manual therapy comments Completed separate from exercises.     Soft tissue mobilization percussion massage gun to left upper trap and medial scapular border to decrease pain/guarding                    PT Education - 11/04/20 1120     Education Details education on HEP additions/progression    Person(s) Educated Patient    Methods Explanation;Demonstration;Handout    Comprehension Verbalized  understanding;Returned demonstration              PT Short Term Goals - 10/13/20 1156       PT SHORT TERM GOAL #1   Title Patient will be independent with HEP in order to improve functional outcomes.    Time 6    Period Weeks    Status New    Target Date 11/24/20      PT SHORT TERM GOAL #2   Title Patient will report at least 25% improvement in symptoms for improved quality of life.    Time 6    Period Weeks    Status New    Target Date 11/24/20               PT Long Term Goals - 10/13/20 1158       PT LONG TERM GOAL #1   Title Patient will report at least 75% improvement in symptoms for improved quality of life.    Time 12    Period Weeks    Status New    Target Date 01/05/21      PT LONG TERM GOAL #2   Title Patient will demonstrate full L shoulder AROM in order to complete overhead tasks.    Time 12    Period Weeks    Status New    Target Date 01/05/21      PT LONG TERM GOAL #3   Title Patient will demonstrate 5/5 L shoulder strength in order to be able to lift objects at work.    Time 12    Period Weeks    Status New    Target Date 01/05/21                   Plan - 11/04/20 1121     Clinical Impression Statement Pt tolerating tx sessions well with incremental increase in left shoulder ROM appreciated.  Palpation of scapular mechanics during wall slides reveals good scapulo-humeral rythm.  Continued sessions indicated to progress LUE to full ROM and progress strengthening into progressive range to improve strength for overhead movements/stabilization    Personal Factors and Comorbidities Comorbidity 3+;Time since onset of injury/illness/exacerbation;Fitness    Comorbidities Arthritis, Back pain, BMI over 30, Diabetes II, High Blood Pressure    Examination-Activity Limitations Reach Overhead;Bend;Lift;Carry;Caring for Others;Hygiene/Grooming;Dressing    Examination-Participation Restrictions Cleaning;Occupation;Meal Prep;Driving;Community  Activity;Laundry;Volunteer;Shop    Stability/Clinical Decision Making Stable/Uncomplicated    Rehab Potential Good  PT Frequency --   1-2x/week   PT Duration 12 weeks    PT Treatment/Interventions ADLs/Self Care Home Management;Aquatic Therapy;Cryotherapy;Electrical Stimulation;Iontophoresis 4mg /ml Dexamethasone;Moist Heat;Traction;Ultrasound;DME Instruction;Gait training;Stair training;Functional mobility training;Therapeutic activities;Therapeutic exercise;Balance training;Neuromuscular re-education;Patient/family education;Manual techniques;Orthotic Fit/Training;Compression bandaging;Dry needling;Energy conservation;Splinting;Taping;Vasopneumatic Device;Spinal Manipulations;Joint Manipulations;Scar mobilization;Passive range of motion    PT Next Visit Plan per protocol, immediate ROM and strength, RC and scap stabilizer program, Goal is 80-90% ROM of opposite shoulder by 2-3 weeks; AAROM to AROM as able, Isometrics, periscapular strength    PT Home Exercise Plan table slides flexion, abduction, pendulums; AAROM flexion/ER in supine, wall walk flexion/abduction, scap retraction 6/27 shoulder ER, IR isometrics, sideyling ER, prone extension, horzontal abd    Consulted and Agree with Plan of Care Patient             Patient will benefit from skilled therapeutic intervention in order to improve the following deficits and impairments:  Decreased range of motion, Impaired UE functional use, Increased muscle spasms, Decreased activity tolerance, Decreased knowledge of precautions, Pain, Hypomobility, Impaired flexibility, Improper body mechanics, Decreased mobility, Decreased strength  Visit Diagnosis: Left shoulder pain, unspecified chronicity  Muscle weakness (generalized)  Other symptoms and signs involving the musculoskeletal system  Stiffness of left shoulder, not elsewhere classified     Problem List Patient Active Problem List   Diagnosis Date Noted   Tonsillar exudate  06/16/2020   Strep sore throat 06/16/2020   Acute rhinosinusitis 02/29/2020   Newly diagnosed diabetes ([place]) 01/13/2017   Type 2 diabetes mellitus without complications ([place]) 99/83/3825   Prediabetes 05/24/2016   Patellar tendinitis of left knee 12/11/2013   Morbid obesity ([place]) 05/09/2013   Osteoarthritis of left knee 11/09/2012   Obstructive sleep apnea 09/11/2012   HTN (hypertension) 09/11/2012   Impaired fasting glucose 09/11/2012   Degenerative arthritis of left knee 06/03/2011   S/P lateral meniscectomy of left knee 06/03/2011   BACK PAIN 05/01/2009   DERANGEMENT MENISCUS 09/04/2007   ELBOW PAIN, BILATERAL 04/06/2007   DEGENERATIVE JOINT DISEASE, KNEE 03/13/2007   Pain in joint, lower leg 03/13/2007   11:23 AM, 11/04/20 M. Sherlyn Lees, PT, DPT Physical Therapist- [place] Office Number: [phone]   [place] [address] [place], [place], [address] Phone: [phone]   Fax:  [phone]  Name: Adam Hahn MRN: [id number] Date of Birth: [birth year]

## 2020-11-04 NOTE — Patient Instructions (Signed)
Access Code: [email or web address] URL: [email or web address] Date: 11/04/2020 Prepared by: Sherlyn Lees  Exercises Sidelying Shoulder ER with Towel and Dumbbell - 1 x daily - 7 x weekly - 3 sets - 10 reps Prone Shoulder Extension - Single Arm - 1 x daily - 7 x weekly - 3 sets - 10 reps Prone Single Arm Shoulder Horizontal Abduction with Dumbbell - 1 x daily - 7 x weekly - 3 sets - 10 reps Upper Trapezius Stretch - 1 x daily - 7 x weekly - 3 sets

## 2020-11-06 ENCOUNTER — Ambulatory Visit (HOSPITAL_COMMUNITY): Payer: No Typology Code available for payment source

## 2020-11-06 ENCOUNTER — Encounter (HOSPITAL_COMMUNITY): Payer: BC Managed Care – PPO | Admitting: Physical Therapy

## 2020-11-06 ENCOUNTER — Other Ambulatory Visit: Payer: Self-pay

## 2020-11-06 ENCOUNTER — Encounter (HOSPITAL_COMMUNITY): Payer: Self-pay

## 2020-11-06 DIAGNOSIS — M25512 Pain in left shoulder: Secondary | ICD-10-CM

## 2020-11-06 DIAGNOSIS — R29898 Other symptoms and signs involving the musculoskeletal system: Secondary | ICD-10-CM

## 2020-11-06 DIAGNOSIS — M25612 Stiffness of left shoulder, not elsewhere classified: Secondary | ICD-10-CM

## 2020-11-06 DIAGNOSIS — M6281 Muscle weakness (generalized): Secondary | ICD-10-CM

## 2020-11-06 NOTE — Therapy (Signed)
[place], [place], [address] Phone: [phone]   Fax:  [phone]  Physical Therapy Treatment  Patient Details  Name: Adam Hahn MRN: [id number] Date of Birth: Jul 28, 1965 Referring Provider (PT): Justice Britain MD   Encounter Date: 11/06/2020   PT End of Session - 11/06/20 1015     Visit Number 6    Number of Visits 24    Authorization Type VA commuity care (15 visits approved - will need to request more visits)    Authorization - Visit Number 6    Authorization - Number of Visits 15    PT Start Time 4656    PT Stop Time 1043    PT Time Calculation (min) 41 min    Activity Tolerance Patient tolerated treatment well    Behavior During Therapy [facility] for tasks assessed/performed             Past Medical History:  Diagnosis Date   Allergic rhinitis    Arthritis    knees,  right shoulder ac joint   History of exercise stress test 10-09-2001 in epic   normal   HTN (hypertension)    OSA on CPAP    Shoulder impingement, right    Type 2 diabetes mellitus ([place])    followed by pcp   Wears glasses     Past Surgical History:  Procedure Laterality Date   COLONOSCOPY WITH PROPOFOL N/A 08/13/2020   Procedure: COLONOSCOPY WITH PROPOFOL;  Surgeon: Harvel Quale, MD;  [facility];  Service: Gastroenterology;  Laterality: N/A;  am   KNEE SURGERY Left x4  last one 1996   LAPAROSCOPIC CHOLECYSTECTOMY  09-20-2007  '@[facility]'    POLYPECTOMY  08/13/2020   Procedure: POLYPECTOMY;  Surgeon: Harvel Quale, MD;  [facility];  Service: Gastroenterology;;   SHOULDER ARTHROSCOPY WITH ROTATOR CUFF REPAIR Right 10/17/2018   Procedure: Right shoulder arthroscopy with subacromial decompression, distal clavicle resection,  rotator cuff DEBRIEDMENT, LABRIAL DEBRIDMENT;  Surgeon: Justice Britain, MD;  Location: [place];  Service: Orthopedics;  Laterality: Right;  122mn     There were no vitals filed for this visit.   Subjective Assessment - 11/06/20 1014     Subjective Shoulder pain scale 5/10, constant achey sore pain.    Patient Stated Goals get back to normal, strength, ROM    Currently in Pain? Yes    Pain Score 5     Pain Location Shoulder    Pain Orientation Left    Pain Descriptors / Indicators Aching;Sore    Pain Onset 1 to 4 weeks ago    Pain Frequency Constant                [facility] PT Assessment - 11/06/20 0001       Assessment   Medical Diagnosis Impingement syndrome of L shoulder/ L RCR, SAD, DCR    Referring Provider (PT) KJustice BritainMD    Onset Date/Surgical Date 10/03/20    Hand Dominance Right    Next MD Visit 11/10/20    Prior Therapy R RCR      Precautions   Precautions Shoulder    Type of Shoulder Precautions Patient states LRCR, referral states SAD, DCR      ROM / Strength   AROM / PROM / Strength AROM;Strength      PROM   Left Shoulder Flexion 142 Degrees   was 110 initial eval   Left Shoulder ABduction 150 Degrees  was 70 degrees initial eval   Left Shoulder External Rotation 50 Degrees   was 0 initial eval     Strength   Strength Assessment Site Shoulder    Right/Left Shoulder Left    Left Shoulder Flexion 3+/5    Left Shoulder Extension 4-/5    Left Shoulder ABduction 4-/5    Left Shoulder Internal Rotation 4/5    Left Shoulder External Rotation 4/5                           [facility] Adult PT Treatment/Exercise - 11/06/20 0001       Exercises   Exercises Shoulder      Shoulder Exercises: Prone   Extension Strengthening;Left;20 reps    Extension Weight (lbs) 1#    Horizontal ABduction 1 Strengthening;Left;20 reps    Horizontal ABduction 1 Weight (lbs) 1#    Other Prone Exercises rows 2x 10      Shoulder Exercises: Sidelying   External Rotation Strengthening;Left;20 reps;Weights    External Rotation Weight (lbs) 1#    ABduction 10 reps;Weights    ABduction Weight (lbs) 1#     ABduction Limitations 2 sets      Shoulder Exercises: Standing   External Rotation 5 reps    Flexion AROM;5 reps    ABduction 5 reps    Other Standing Exercises wall arch 10x      Manual Therapy   Manual Therapy Soft tissue mobilization    Manual therapy comments Manual complete separate than rest of tx    Soft tissue mobilization Prone: STM focus on periscapular mm, UT, PROM Lt shoulder all directions                      PT Short Term Goals - 11/06/20 1032       PT SHORT TERM GOAL #1   Title Patient will be independent with HEP in order to improve functional outcomes.    Baseline 11/06/20:  Reports compliance with current HEP daily    Status Achieved      PT SHORT TERM GOAL #2   Title Patient will report at least 25% improvement in symptoms for improved quality of life.    Baseline 11/06/20:  Reports improvements by 50%, continues to have some weakness, ROM  and pain.    Status Achieved               PT Long Term Goals - 11/06/20 1034       PT LONG TERM GOAL #1   Title Patient will report at least 75% improvement in symptoms for improved quality of life.    Baseline 11/06/20:  Reports improvements by 50%, continues to have some weakness, ROM  and pain.      PT LONG TERM GOAL #2   Title Patient will demonstrate full L shoulder AROM in order to complete overhead tasks.    Baseline 11/06/20:  see ROM    Status On-going      PT LONG TERM GOAL #3   Title Patient will demonstrate 5/5 L shoulder strength in order to be able to lift objects at work.    Baseline 11/06/20:                   Plan - 11/06/20 1040     Clinical Impression Statement Reviewed goals prior MD apt next Monday with noted good progress.  Pt has met 2/2 STGs and progressing towards LTGs.  Pt reports improvements by 50%, continues to have some weakness, ROM deficits and joint pain iwth movements.  Session focus with shoulder strengthening exercises and manual to address restrictions.   Noted knot Lt Rhomboid region, encouraged to show to MD.    Personal Factors and Comorbidities Comorbidity 3+;Time since onset of injury/illness/exacerbation;Fitness    Comorbidities Arthritis, Back pain, BMI over 30, Diabetes II, High Blood Pressure    Examination-Activity Limitations Reach Overhead;Bend;Lift;Carry;Caring for Others;Hygiene/Grooming;Dressing    Examination-Participation Restrictions Cleaning;Occupation;Meal Prep;Driving;Community Activity;Laundry;Volunteer;Shop    Stability/Clinical Decision Making Stable/Uncomplicated    Clinical Decision Making Low    Rehab Potential Good    PT Frequency --   1-2/week   PT Duration 12 weeks    PT Treatment/Interventions ADLs/Self Care Home Management;Aquatic Therapy;Cryotherapy;Electrical Stimulation;Iontophoresis 40m/ml Dexamethasone;Moist Heat;Traction;Ultrasound;DME Instruction;Gait training;Stair training;Functional mobility training;Therapeutic activities;Therapeutic exercise;Balance training;Neuromuscular re-education;Patient/family education;Manual techniques;Orthotic Fit/Training;Compression bandaging;Dry needling;Energy conservation;Splinting;Taping;Vasopneumatic Device;Spinal Manipulations;Joint Manipulations;Scar mobilization;Passive range of motion    PT Next Visit Plan F/U wiht MD apt.  per protocol, immediate ROM and strength, RC and scap stabilizer program, Goal is 80-90% ROM of opposite shoulder by 2-3 weeks; AAROM to AROM as able, Isometrics, periscapular strength    PT Home Exercise Plan table slides flexion, abduction, pendulums; AAROM flexion/ER in supine, wall walk flexion/abduction, scap retraction 6/27 shoulder ER, IR isometrics, sideyling ER, prone extension, horzontal abd    Consulted and Agree with Plan of Care Patient             Patient will benefit from skilled therapeutic intervention in order to improve the following deficits and impairments:  Decreased range of motion, Impaired UE functional use, Increased  muscle spasms, Decreased activity tolerance, Decreased knowledge of precautions, Pain, Hypomobility, Impaired flexibility, Improper body mechanics, Decreased mobility, Decreased strength  Visit Diagnosis: Left shoulder pain, unspecified chronicity  Muscle weakness (generalized)  Stiffness of left shoulder, not elsewhere classified  Other symptoms and signs involving the musculoskeletal system     Problem List Patient Active Problem List   Diagnosis Date Noted   Tonsillar exudate 06/16/2020   Strep sore throat 06/16/2020   Acute rhinosinusitis 02/29/2020   Newly diagnosed diabetes ([place] 01/13/2017   Type 2 diabetes mellitus without complications ([place] 070/76/1518  Prediabetes 05/24/2016   Patellar tendinitis of left knee 12/11/2013   Morbid obesity ([place] 05/09/2013   Osteoarthritis of left knee 11/09/2012   Obstructive sleep apnea 09/11/2012   HTN (hypertension) 09/11/2012   Impaired fasting glucose 09/11/2012   Degenerative arthritis of left knee 06/03/2011   S/P lateral meniscectomy of left knee 06/03/2011   BACK PAIN 05/01/2009   DERANGEMENT MENISCUS 09/04/2007   ELBOW PAIN, BILATERAL 04/06/2007   DEGENERATIVE JOINT DISEASE, KNEE 03/13/2007   Pain in joint, lower leg 03/13/2007   CIhor Austin LPTA/CLT; CBIS [phone] CAldona Lento7/10/2020, 2:40 PM  [id number] [address]Phone: [phone]  Fax:  [phone] Name: Adam BRANDSMRN: [id number] of Birth: 1March 30, 1967

## 2020-11-10 ENCOUNTER — Other Ambulatory Visit: Payer: Self-pay

## 2020-11-10 ENCOUNTER — Encounter (HOSPITAL_COMMUNITY): Payer: Self-pay | Admitting: Physical Therapy

## 2020-11-10 ENCOUNTER — Ambulatory Visit (HOSPITAL_COMMUNITY): Payer: No Typology Code available for payment source | Admitting: Physical Therapy

## 2020-11-10 DIAGNOSIS — M25512 Pain in left shoulder: Secondary | ICD-10-CM

## 2020-11-10 DIAGNOSIS — M25612 Stiffness of left shoulder, not elsewhere classified: Secondary | ICD-10-CM

## 2020-11-10 DIAGNOSIS — M6281 Muscle weakness (generalized): Secondary | ICD-10-CM

## 2020-11-10 DIAGNOSIS — R29898 Other symptoms and signs involving the musculoskeletal system: Secondary | ICD-10-CM

## 2020-11-10 NOTE — Patient Instructions (Signed)
Access Code: [email or web address] URL: [email or web address] Date: 11/10/2020 Prepared by: Mitzi Hansen Timur Nibert  Exercises Standing Shoulder Flexion Full Range - 1 x daily - 7 x weekly - 2 sets - 10 reps Shoulder External Rotation and Scapular Retraction with Resistance - 1 x daily - 7 x weekly - 2 sets - 10 reps Standing Shoulder Horizontal Abduction with Resistance - 1 x daily - 7 x weekly - 2 sets - 10 reps Standing Shoulder Single Arm PNF D2 Flexion with Resistance (Mirrored) - 1 x daily - 7 x weekly - 2 sets - 10 reps

## 2020-11-10 NOTE — Therapy (Signed)
Adam Hahn, [place], [address] Phone: [phone]   Fax:  [phone]  Physical Therapy Treatment  Patient Details  Name: Adam Hahn MRN: [id number] Date of Birth: 07/06/1965 Referring Provider (PT): Justice Britain MD   Encounter Date: 11/10/2020   PT End of Session - 11/10/20 0916     Visit Number 7    Number of Visits 24    Date for PT Re-Evaluation 01/05/21    Authorization Type VA commuity care (15 visits approved - will need to request more visits)    Authorization - Visit Number 7    Authorization - Number of Visits 15    PT Start Time 0917    PT Stop Time 0955    PT Time Calculation (min) 38 min    Activity Tolerance Patient tolerated treatment well    Behavior During Therapy [facility] for tasks assessed/performed             Past Medical History:  Diagnosis Date   Allergic rhinitis    Arthritis    knees,  right shoulder ac joint   History of exercise stress test 10-09-2001 in epic   normal   HTN (hypertension)    OSA on CPAP    Shoulder impingement, right    Type 2 diabetes mellitus ([place])    followed by pcp   Wears glasses     Past Surgical History:  Procedure Laterality Date   COLONOSCOPY WITH PROPOFOL N/A 08/13/2020   Procedure: COLONOSCOPY WITH PROPOFOL;  Surgeon: Harvel Quale, MD;  [facility];  Service: Gastroenterology;  Laterality: N/A;  am   KNEE SURGERY Left x4  last one [place]  09-20-2007  $RemoveBef'@AP'[id number]$    POLYPECTOMY  08/13/2020   Procedure: POLYPECTOMY;  Surgeon: Harvel Quale, MD;  [facility];  Service: Gastroenterology;;   SHOULDER ARTHROSCOPY WITH ROTATOR CUFF REPAIR Right 10/17/2018   Procedure: Right shoulder arthroscopy with subacromial decompression, distal clavicle resection,  rotator cuff DEBRIEDMENT, LABRIAL DEBRIDMENT;  Surgeon: Justice Britain, MD;  Location: [place];  Service:  Orthopedics;  Laterality: Right;  159min    There were no vitals filed for this visit.   Subjective Assessment - 11/10/20 0917     Subjective Patient states shoulder is aching. Exercises are going well.    Patient Stated Goals get back to normal, strength, ROM    Currently in Pain? Yes    Pain Score 5     Pain Location Shoulder    Pain Orientation Left    Pain Descriptors / Indicators Aching    Pain Type Surgical pain    Pain Onset 1 to 4 weeks ago    Pain Frequency Constant                [facility] PT Assessment - 11/10/20 0001       PROM   Left Shoulder Flexion 140 Degrees    Left Shoulder ABduction 133 Degrees                           [facility] Adult PT Treatment/Exercise - 11/10/20 0001       Shoulder Exercises: Supine   Other Supine Exercises PNF D2 green band 2x10      Shoulder Exercises: Prone   Extension AROM;15 reps    Extension Weight (lbs) 2#    Extension Limitations 2 sets    Horizontal ABduction  1 AROM;Left;15 reps    Horizontal ABduction 1 Weight (lbs) 2#    Horizontal ABduction 1 Limitations 2 sets    Other Prone Exercises rows 2x 15 2#      Shoulder Exercises: Standing   Flexion AROM;Both;10 reps    Flexion Limitations green band between hands 2 sets    Extension Both;15 reps    Theraband Level (Shoulder Extension) Level 4 (Blue)    Extension Limitations 2 sets    Row 10 reps    Theraband Level (Shoulder Row) Level 4 (Blue)    Row Limitations 2 sets    Other Standing Exercises wall wash/ABCs 2x    Other Standing Exercises scapular retraciton iwth GH ER 2x 10 green band, horizontal abduction 2x 10 green band                    PT Education - 11/10/20 0917     Education Details HEP, exercise mechanics    Person(s) Educated Patient    Methods Explanation;Demonstration    Comprehension Verbalized understanding;Returned demonstration              PT Short Term Goals - 11/06/20 1032       PT SHORT TERM GOAL #1    Title Patient will be independent with HEP in order to improve functional outcomes.    Baseline 11/06/20:  Reports compliance with current HEP daily    Status Achieved      PT SHORT TERM GOAL #2   Title Patient will report at least 25% improvement in symptoms for improved quality of life.    Baseline 11/06/20:  Reports improvements by 50%, continues to have some weakness, ROM  and pain.    Status Achieved               PT Long Term Goals - 11/06/20 1034       PT LONG TERM GOAL #1   Title Patient will report at least 75% improvement in symptoms for improved quality of life.    Baseline 11/06/20:  Reports improvements by 50%, continues to have some weakness, ROM  and pain.      PT LONG TERM GOAL #2   Title Patient will demonstrate full L shoulder AROM in order to complete overhead tasks.    Baseline 11/06/20:  see ROM    Status On-going      PT LONG TERM GOAL #3   Title Patient will demonstrate 5/5 L shoulder strength in order to be able to lift objects at work.    Baseline 11/06/20:                   Plan - 11/10/20 0917     Clinical Impression Statement Patient showing good ROM although is lacking end range motion. Discussed POC with patient and patient concerned running out of [facility] visits before POC is up. Will check up on requesting more visits if needed. Patient tolerating increased reps/weights of prone periscapular strengthening today. Added additional AROM/shoulder strengthening in standing today with intermittent pain but overall good ROM and improving strength. Patient performs resisted strengthening at wall for postural cueing. Patient unable to perform PNF exercise in standing due to impaired strength and shoulder pain, completes with good mechanics in supine. Patient will continue to benefit from skilled physical therapy in order to reduce impairment and improve function.    Personal Factors and Comorbidities Comorbidity 3+;Time since onset of  injury/illness/exacerbation;Fitness    Comorbidities Arthritis, Back pain, BMI over 30,  Diabetes II, High Blood Pressure    Examination-Activity Limitations Reach Overhead;Bend;Lift;Carry;Caring for Others;Hygiene/Grooming;Dressing    Examination-Participation Restrictions Cleaning;Occupation;Meal Prep;Driving;Community Activity;Laundry;Volunteer;Shop    Stability/Clinical Decision Making Stable/Uncomplicated    Rehab Potential Good    PT Frequency --   1-2/week   PT Duration 12 weeks    PT Treatment/Interventions ADLs/Self Care Home Management;Aquatic Therapy;Cryotherapy;Electrical Stimulation;Iontophoresis 49m/ml Dexamethasone;Moist Heat;Traction;Ultrasound;DME Instruction;Gait training;Stair training;Functional mobility training;Therapeutic activities;Therapeutic exercise;Balance training;Neuromuscular re-education;Patient/family education;Manual techniques;Orthotic Fit/Training;Compression bandaging;Dry needling;Energy conservation;Splinting;Taping;Vasopneumatic Device;Spinal Manipulations;Joint Manipulations;Scar mobilization;Passive range of motion    PT Next Visit Plan F/U wiht MD apt.  per protocol, immediate ROM and strength, RC and scap stabilizer program, Goal is 80-90% ROM of opposite shoulder by 2-3 weeks; AROM as able, periscapular strength, deltoid/RC strength    PT Home Exercise Plan table slides flexion, abduction, pendulums; AAROM flexion/ER in supine, wall walk flexion/abduction, scap retraction 6/27 shoulder ER, IR isometrics, sideyling ER, prone extension, horzontal abd 7/11 shoulder flexion with band between hands, scap ret with [place], horizontal abd, PNF D2 with green band for all    Consulted and Agree with Plan of Care Patient             Patient will benefit from skilled therapeutic intervention in order to improve the following deficits and impairments:  Decreased range of motion, Impaired UE functional use, Increased muscle spasms, Decreased activity tolerance,  Decreased knowledge of precautions, Pain, Hypomobility, Impaired flexibility, Improper body mechanics, Decreased mobility, Decreased strength  Visit Diagnosis: Left shoulder pain, unspecified chronicity  Muscle weakness (generalized)  Stiffness of left shoulder, not elsewhere classified  Other symptoms and signs involving the musculoskeletal system     Problem List Patient Active Problem List   Diagnosis Date Noted   Tonsillar exudate 06/16/2020   Strep sore throat 06/16/2020   Acute rhinosinusitis 02/29/2020   Newly diagnosed diabetes ([place] 01/13/2017   Type 2 diabetes mellitus without complications ([place] 084/57/3344  Prediabetes 05/24/2016   Patellar tendinitis of left knee 12/11/2013   Morbid obesity ([place] 05/09/2013   Osteoarthritis of left knee 11/09/2012   Obstructive sleep apnea 09/11/2012   HTN (hypertension) 09/11/2012   Impaired fasting glucose 09/11/2012   Degenerative arthritis of left knee 06/03/2011   S/P lateral meniscectomy of left knee 06/03/2011   BACK PAIN 05/01/2009   DERANGEMENT MENISCUS 09/04/2007   ELBOW PAIN, BILATERAL 04/06/2007   DEGENERATIVE JOINT DISEASE, KNEE 03/13/2007   Pain in joint, lower leg 03/13/2007    9:55 AM, 11/10/20 AMearl LatinPT, DPT [place] [address]Phone: [phone]  Fax:  [phone] Name: OAVEON COLQUHOUNMRN: [id number] of Birth: [birth year]

## 2020-11-12 ENCOUNTER — Ambulatory Visit (HOSPITAL_COMMUNITY): Payer: No Typology Code available for payment source

## 2020-11-13 ENCOUNTER — Other Ambulatory Visit: Payer: Self-pay

## 2020-11-13 ENCOUNTER — Encounter (HOSPITAL_COMMUNITY): Payer: Self-pay | Admitting: Physical Therapy

## 2020-11-13 ENCOUNTER — Ambulatory Visit (HOSPITAL_COMMUNITY): Payer: No Typology Code available for payment source | Admitting: Physical Therapy

## 2020-11-13 DIAGNOSIS — M25512 Pain in left shoulder: Secondary | ICD-10-CM

## 2020-11-13 DIAGNOSIS — M25612 Stiffness of left shoulder, not elsewhere classified: Secondary | ICD-10-CM

## 2020-11-13 DIAGNOSIS — R29898 Other symptoms and signs involving the musculoskeletal system: Secondary | ICD-10-CM

## 2020-11-13 DIAGNOSIS — M6281 Muscle weakness (generalized): Secondary | ICD-10-CM

## 2020-11-13 NOTE — Therapy (Signed)
[place], [place], [address] Phone: [phone]   Fax:  [phone]  Physical Therapy Treatment  Patient Details  Name: Adam Hahn MRN: [id number] Date of Birth: Sep 16, 1965 Referring Provider (PT): Justice Britain MD   Encounter Date: 11/13/2020   PT End of Session - 11/13/20 1443     Visit Number 8    Number of Visits 24    Date for PT Re-Evaluation 01/05/21    Authorization Type VA commuity care (15 visits approved - will need to request more visits)    Authorization - Visit Number 8    Authorization - Number of Visits 15    PT Start Time 7253    PT Stop Time 1525    PT Time Calculation (min) 40 min    Activity Tolerance Patient tolerated treatment well    Behavior During Therapy [facility] for tasks assessed/performed             Past Medical History:  Diagnosis Date   Allergic rhinitis    Arthritis    knees,  right shoulder ac joint   History of exercise stress test 10-09-2001 in epic   normal   HTN (hypertension)    OSA on CPAP    Shoulder impingement, right    Type 2 diabetes mellitus ([place])    followed by pcp   Wears glasses     Past Surgical History:  Procedure Laterality Date   COLONOSCOPY WITH PROPOFOL N/A 08/13/2020   Procedure: COLONOSCOPY WITH PROPOFOL;  Surgeon: Harvel Quale, MD;  [facility];  Service: Gastroenterology;  Laterality: N/A;  am   KNEE SURGERY Left x4  last one [place]  09-20-2007  _0    POLYPECTOMY  08/13/2020   Procedure: POLYPECTOMY;  Surgeon: Harvel Quale, MD;  [facility];  Service: Gastroenterology;;   SHOULDER ARTHROSCOPY WITH ROTATOR CUFF REPAIR Right 10/17/2018   Procedure: Right shoulder arthroscopy with subacromial decompression, distal clavicle resection,  rotator cuff DEBRIEDMENT, LABRIAL DEBRIDMENT;  Surgeon: Justice Britain, MD;  Location: [place];  Service:  Orthopedics;  Laterality: Right;  145mn    There were no vitals filed for this visit.   Subjective Assessment - 11/13/20 1444     Subjective Patient states MD said he has inflammation in his shoulder likely causing pain and to do ultrasound on it. He is having alot stiffness and pain.    Patient Stated Goals get back to normal, strength, ROM    Currently in Pain? Yes    Pain Score 7     Pain Location Shoulder    Pain Orientation Left    Pain Descriptors / Indicators Aching    Pain Onset 1 to 4 weeks ago    Pain Frequency Constant                [facility] PT Assessment - 11/13/20 0001       PROM   Left Shoulder Flexion 145 Degrees                           [facility] Adult PT Treatment/Exercise - 11/13/20 0001       Shoulder Exercises: Standing   External Rotation AROM;Left;10 reps;Theraband    Theraband Level (Shoulder External Rotation) Level 3 (Green)    External Rotation Limitations 3 sets    Flexion 10 reps;AAROM    Flexion Limitations wall slides 2x 10  with 5 second holds    Other Standing Exercises repeated shoulder extension 5x 5 second holds, 2 sets    Other Standing Exercises short lever shoulder flexion 3c 10 green band      Manual Therapy   Manual Therapy Soft tissue mobilization;Joint mobilization    Manual therapy comments Manual complete separate than rest of tx    Joint Mobilization GH inferior glide Grade II-III    Soft tissue mobilization self STM to infraspinatus                    PT Education - 11/13/20 1444     Education Details HEP, exercise mechanics    Person(s) Educated Patient    Methods Explanation    Comprehension Verbalized understanding              PT Short Term Goals - 11/06/20 1032       PT SHORT TERM GOAL #1   Title Patient will be independent with HEP in order to improve functional outcomes.    Baseline 11/06/20:  Reports compliance with current HEP daily    Status Achieved      PT SHORT TERM  GOAL #2   Title Patient will report at least 25% improvement in symptoms for improved quality of life.    Baseline 11/06/20:  Reports improvements by 50%, continues to have some weakness, ROM  and pain.    Status Achieved               PT Long Term Goals - 11/06/20 1034       PT LONG TERM GOAL #1   Title Patient will report at least 75% improvement in symptoms for improved quality of life.    Baseline 11/06/20:  Reports improvements by 50%, continues to have some weakness, ROM  and pain.      PT LONG TERM GOAL #2   Title Patient will demonstrate full L shoulder AROM in order to complete overhead tasks.    Baseline 11/06/20:  see ROM    Status On-going      PT LONG TERM GOAL #3   Title Patient will demonstrate 5/5 L shoulder strength in order to be able to lift objects at work.    Baseline 11/06/20:                   Plan - 11/13/20 1443     Clinical Impression Statement Patient showing decreased overhead ROM compared to prior sessions. Patient with c/o shoulder pain with shoulder flexion which is from infraspinatus. Patient with active trigger point in infraspinatus. Patient educated on and completes self STM with tennis ball to infraspinatus. Patient showing improving overhead motion following repeated shoulder extension. Patient tolerates mobilizations well with minimal change in symptoms following. Patient demonstrating good ROM and continued symptoms likely due to impaired strength and inflammation. Patient will continue to benefit from skilled physical therapy in order to reduce impairment an improve function.    Personal Factors and Comorbidities Comorbidity 3+;Time since onset of injury/illness/exacerbation;Fitness    Comorbidities Arthritis, Back pain, BMI over 30, Diabetes II, High Blood Pressure    Examination-Activity Limitations Reach Overhead;Bend;Lift;Carry;Caring for Others;Hygiene/Grooming;Dressing    Examination-Participation Restrictions Cleaning;Occupation;Meal  Prep;Driving;Community Activity;Laundry;Volunteer;Shop    Stability/Clinical Decision Making Stable/Uncomplicated    Rehab Potential Good    PT Frequency --   1-2/week   PT Duration 12 weeks    PT Treatment/Interventions ADLs/Self Care Home Management;Aquatic Therapy;Cryotherapy;Electrical Stimulation;Iontophoresis 9m/ml Dexamethasone;Moist Heat;Traction;Ultrasound;DME Instruction;Gait training;Stair training;Functional mobility training;Therapeutic  activities;Therapeutic exercise;Balance training;Neuromuscular re-education;Patient/family education;Manual techniques;Orthotic Fit/Training;Compression bandaging;Dry needling;Energy conservation;Splinting;Taping;Vasopneumatic Device;Spinal Manipulations;Joint Manipulations;Scar mobilization;Passive range of motion    PT Next Visit Plan per protocol, immediate ROM and strength, RC and scap stabilizer program, Goal is 80-90% ROM of opposite shoulder by 2-3 weeks; AROM as able, periscapular strength, deltoid/RC strength    PT Home Exercise Plan table slides flexion, abduction, pendulums; AAROM flexion/ER in supine, wall walk flexion/abduction, scap retraction 6/27 shoulder ER, IR isometrics, sideyling ER, prone extension, horzontal abd 7/11 shoulder flexion with band between hands, scap ret with [place] ER, horizontal abd, PNF D2 with green band for all    Consulted and Agree with Plan of Care Patient             Patient will benefit from skilled therapeutic intervention in order to improve the following deficits and impairments:  Decreased range of motion, Impaired UE functional use, Increased muscle spasms, Decreased activity tolerance, Decreased knowledge of precautions, Pain, Hypomobility, Impaired flexibility, Improper body mechanics, Decreased mobility, Decreased strength  Visit Diagnosis: Left shoulder pain, unspecified chronicity  Muscle weakness (generalized)  Stiffness of left shoulder, not elsewhere classified  Other symptoms and signs  involving the musculoskeletal system     Problem List Patient Active Problem List   Diagnosis Date Noted   Tonsillar exudate 06/16/2020   Strep sore throat 06/16/2020   Acute rhinosinusitis 02/29/2020   Newly diagnosed diabetes ([place]) 01/13/2017   Type 2 diabetes mellitus without complications ([place]) 54/86/2824   Prediabetes 05/24/2016   Patellar tendinitis of left knee 12/11/2013   Morbid obesity ([place]) 05/09/2013   Osteoarthritis of left knee 11/09/2012   Obstructive sleep apnea 09/11/2012   HTN (hypertension) 09/11/2012   Impaired fasting glucose 09/11/2012   Degenerative arthritis of left knee 06/03/2011   S/P lateral meniscectomy of left knee 06/03/2011   BACK PAIN 05/01/2009   DERANGEMENT MENISCUS 09/04/2007   ELBOW PAIN, BILATERAL 04/06/2007   DEGENERATIVE JOINT DISEASE, KNEE 03/13/2007   Pain in joint, lower leg 03/13/2007   3:30 PM, 11/13/20 Mearl Latin PT, DPT Physical Therapist at [place], [place], [address] Phone: [phone]   Fax:  [phone]  Name: Adam Hahn MRN: [id number] Date of Birth: [birth year]

## 2020-11-17 ENCOUNTER — Ambulatory Visit (HOSPITAL_COMMUNITY): Payer: No Typology Code available for payment source

## 2020-11-17 ENCOUNTER — Encounter (HOSPITAL_COMMUNITY): Payer: Self-pay

## 2020-11-17 ENCOUNTER — Other Ambulatory Visit: Payer: Self-pay

## 2020-11-17 DIAGNOSIS — R29898 Other symptoms and signs involving the musculoskeletal system: Secondary | ICD-10-CM

## 2020-11-17 DIAGNOSIS — M25612 Stiffness of left shoulder, not elsewhere classified: Secondary | ICD-10-CM

## 2020-11-17 DIAGNOSIS — M6281 Muscle weakness (generalized): Secondary | ICD-10-CM

## 2020-11-17 DIAGNOSIS — M25512 Pain in left shoulder: Secondary | ICD-10-CM | POA: Diagnosis not present

## 2020-11-17 NOTE — Therapy (Signed)
[place], [place], [address] Phone: [phone]   Fax:  [phone]  Physical Therapy Treatment  Patient Details  Name: Adam Hahn MRN: [id number] Date of Birth: [birth year] Referring Provider (PT): Justice Britain MD   Encounter Date: 11/17/2020   PT End of Session - 11/17/20 1122     Visit Number 9    Number of Visits 24    Date for PT Re-Evaluation 01/05/21    Authorization Type VA commuity care (15 visits approved - will need to request more visits)    Authorization - Visit Number 9    Authorization - Number of Visits 15    PT Start Time 1110    PT Stop Time 1200    PT Time Calculation (min) 50 min    Activity Tolerance Patient tolerated treatment well    Behavior During Therapy [facility] for tasks assessed/performed             Past Medical History:  Diagnosis Date   Allergic rhinitis    Arthritis    knees,  right shoulder ac joint   History of exercise stress test 10-09-2001 in epic   normal   HTN (hypertension)    OSA on CPAP    Shoulder impingement, right    Type 2 diabetes mellitus ([place])    followed by pcp   Wears glasses     Past Surgical History:  Procedure Laterality Date   COLONOSCOPY WITH PROPOFOL N/A 08/13/2020   Procedure: COLONOSCOPY WITH PROPOFOL;  Surgeon: Harvel Quale, MD;  [facility];  Service: Gastroenterology;  Laterality: N/A;  am   KNEE SURGERY Left x4  last one 1996   LAPAROSCOPIC CHOLECYSTECTOMY  09-20-2007  '@[facility]'    POLYPECTOMY  08/13/2020   Procedure: POLYPECTOMY;  Surgeon: Harvel Quale, MD;  [facility];  Service: Gastroenterology;;   SHOULDER ARTHROSCOPY WITH ROTATOR CUFF REPAIR Right 10/17/2018   Procedure: Right shoulder arthroscopy with subacromial decompression, distal clavicle resection,  rotator cuff DEBRIEDMENT, LABRIAL DEBRIDMENT;  Surgeon: Justice Britain, MD;  Location: [place];  Service:  Orthopedics;  Laterality: Right;  179mn    There were no vitals filed for this visit.   Subjective Assessment - 11/17/20 1122     Subjective A little sore today, around a 4/10 in the left shoulder    Patient Stated Goals get back to normal, strength, ROM    Pain Onset 1 to 4 weeks ago                [facility]       Assessment   Medical Diagnosis Impingement syndrome of L shoulder/ L RCR, SAD, DCR    Referring Provider (PT) KJustice BritainMD    Onset Date/Surgical Date 10/03/20    Hand Dominance Right                           [facility] Adult PT Treatment/Exercise - 11/17/20 0001       Shoulder Exercises: Prone   Extension Strengthening;Left   3x10 mild manual resistance   Other Prone Exercises 3x10 10#      Shoulder Exercises: Sidelying   External Rotation Strengthening;Left    External Rotation Weight (lbs) 3#    External Rotation Limitations 3x10      Shoulder Exercises: Standing   Protraction Strengthening;Both    Protraction Limitations 3x10 rolling foam roll up  the wall through forearms for serratus anterior      Shoulder Exercises: Stretch   Internal Rotation Stretch --   2x10 reps 3 sec hold Sleeper stretch     Manual Therapy   Manual Therapy Soft tissue mobilization    Manual therapy comments Manual complete separate than rest of tx    Joint Mobilization GH inferior glide Grade II-III    Soft tissue mobilization percussion massage to posterior left shoulder for pain, muscle guarding                      PT Short Term Goals - 11/06/20 1032       PT SHORT TERM GOAL #1   Title Patient will be independent with HEP in order to improve functional outcomes.    Baseline 11/06/20:  Reports compliance with current HEP daily    Status Achieved      PT SHORT TERM GOAL #2   Title Patient will report at least 25% improvement in symptoms for improved quality of life.    Baseline 11/06/20:  Reports improvements by  50%, continues to have some weakness, ROM  and pain.    Status Achieved               PT Long Term Goals - 11/06/20 1034       PT LONG TERM GOAL #1   Title Patient will report at least 75% improvement in symptoms for improved quality of life.    Baseline 11/06/20:  Reports improvements by 50%, continues to have some weakness, ROM  and pain.      PT LONG TERM GOAL #2   Title Patient will demonstrate full L shoulder AROM in order to complete overhead tasks.    Baseline 11/06/20:  see ROM    Status On-going      PT LONG TERM GOAL #3   Title Patient will demonstrate 5/5 L shoulder strength in order to be able to lift objects at work.    Baseline 11/06/20:                   Plan - 11/17/20 1203     Clinical Impression Statement some left scapular dyskinesia with over head movements likely related to serratus inactivity and posterior capsule tightness. Improved movement with tactile cues when progressing to shoulder height ROM.  Continued PT sessions indicated to improve left shoulder ROM, strength, dynamic stability    Personal Factors and Comorbidities Comorbidity 3+;Time since onset of injury/illness/exacerbation;Fitness    Comorbidities Arthritis, Back pain, BMI over 30, Diabetes II, High Blood Pressure    Examination-Activity Limitations Reach Overhead;Bend;Lift;Carry;Caring for Others;Hygiene/Grooming;Dressing    Examination-Participation Restrictions Cleaning;Occupation;Meal Prep;Driving;Community Activity;Laundry;Volunteer;Shop    Stability/Clinical Decision Making Stable/Uncomplicated    Rehab Potential Good    PT Frequency --   1-2/week   PT Duration 12 weeks    PT Treatment/Interventions ADLs/Self Care Home Management;Aquatic Therapy;Cryotherapy;Electrical Stimulation;Iontophoresis 68m/ml Dexamethasone;Moist Heat;Traction;Ultrasound;DME Instruction;Gait training;Stair training;Functional mobility training;Therapeutic activities;Therapeutic exercise;Balance  training;Neuromuscular re-education;Patient/family education;Manual techniques;Orthotic Fit/Training;Compression bandaging;Dry needling;Energy conservation;Splinting;Taping;Vasopneumatic Device;Spinal Manipulations;Joint Manipulations;Scar mobilization;Passive range of motion    PT Next Visit Plan per protocol, immediate ROM and strength, RC and scap stabilizer program, Goal is 80-90% ROM of opposite shoulder by 2-3 weeks; AROM as able, periscapular strength, deltoid/RC strength    PT Home Exercise Plan table slides flexion, abduction, pendulums; AAROM flexion/ER in supine, wall walk flexion/abduction, scap retraction 6/27 shoulder ER, IR isometrics, sideyling ER, prone extension, horzontal abd 7/11 shoulder flexion with band between  hands, scap ret with [place] ER, horizontal abd, PNF D2 with green band for all    Consulted and Agree with Plan of Care Patient             Patient will benefit from skilled therapeutic intervention in order to improve the following deficits and impairments:  Decreased range of motion, Impaired UE functional use, Increased muscle spasms, Decreased activity tolerance, Decreased knowledge of precautions, Pain, Hypomobility, Impaired flexibility, Improper body mechanics, Decreased mobility, Decreased strength  Visit Diagnosis: Left shoulder pain, unspecified chronicity  Muscle weakness (generalized)  Stiffness of left shoulder, not elsewhere classified  Other symptoms and signs involving the musculoskeletal system     Problem List Patient Active Problem List   Diagnosis Date Noted   Tonsillar exudate 06/16/2020   Strep sore throat 06/16/2020   Acute rhinosinusitis 02/29/2020   Newly diagnosed diabetes ([place]) 01/13/2017   Type 2 diabetes mellitus without complications ([place]) 58/48/3507   Prediabetes 05/24/2016   Patellar tendinitis of left knee 12/11/2013   Morbid obesity ([place]) 05/09/2013   Osteoarthritis of left knee 11/09/2012   Obstructive sleep apnea  09/11/2012   HTN (hypertension) 09/11/2012   Impaired fasting glucose 09/11/2012   Degenerative arthritis of left knee 06/03/2011   S/P lateral meniscectomy of left knee 06/03/2011   BACK PAIN 05/01/2009   DERANGEMENT MENISCUS 09/04/2007   ELBOW PAIN, BILATERAL 04/06/2007   DEGENERATIVE JOINT DISEASE, KNEE 03/13/2007   Pain in joint, lower leg 03/13/2007   12:05 PM, 11/17/20 M. Sherlyn Lees, PT, DPT Physical Therapist- [place] Office Number: [phone]   [place] [address] [place], [place], [address] Phone: [phone]   Fax:  [phone]  Name: JERRIN RECORE MRN: [id number] Date of Birth: 08-10-1965

## 2020-11-19 ENCOUNTER — Other Ambulatory Visit: Payer: Self-pay

## 2020-11-19 ENCOUNTER — Ambulatory Visit (HOSPITAL_COMMUNITY): Payer: No Typology Code available for payment source

## 2020-11-19 ENCOUNTER — Encounter (HOSPITAL_COMMUNITY): Payer: Self-pay

## 2020-11-19 DIAGNOSIS — M25612 Stiffness of left shoulder, not elsewhere classified: Secondary | ICD-10-CM

## 2020-11-19 DIAGNOSIS — R29898 Other symptoms and signs involving the musculoskeletal system: Secondary | ICD-10-CM

## 2020-11-19 DIAGNOSIS — M25512 Pain in left shoulder: Secondary | ICD-10-CM

## 2020-11-19 DIAGNOSIS — M6281 Muscle weakness (generalized): Secondary | ICD-10-CM

## 2020-11-19 NOTE — Therapy (Signed)
[place] [address] [place], [place], [address] Phone: [phone]   Fax:  [phone]  Physical Therapy Treatment and Progress Note  Patient Details  Name: Adam Hahn MRN: [id number] Date of Birth: 04/24/1966 Referring Provider (PT): Justice Britain MD  Progress Note Reporting Period 10/13/20 to 11/19/20  See note below for Objective Data and Assessment of Progress/Goals.      Encounter Date: 11/19/2020   PT End of Session - 11/19/20 1355     Visit Number 10    Number of Visits 24    Date for PT Re-Evaluation 01/05/21    Authorization Type VA commuity care (15 visits approved - will need to request more visits)    Authorization - Visit Number 10    Authorization - Number of Visits 15    PT Start Time 0932    PT Stop Time 1430    PT Time Calculation (min) 45 min    Activity Tolerance Patient tolerated treatment well    Behavior During Therapy [facility] for tasks assessed/performed             Past Medical History:  Diagnosis Date   Allergic rhinitis    Arthritis    knees,  right shoulder ac joint   History of exercise stress test 10-09-2001 in epic   normal   HTN (hypertension)    OSA on CPAP    Shoulder impingement, right    Type 2 diabetes mellitus ([place])    followed by pcp   Wears glasses     Past Surgical History:  Procedure Laterality Date   COLONOSCOPY WITH PROPOFOL N/A 08/13/2020   Procedure: COLONOSCOPY WITH PROPOFOL;  Surgeon: Harvel Quale, MD;  [facility];  Service: Gastroenterology;  Laterality: N/A;  am   KNEE SURGERY Left x4  last one 1996   LAPAROSCOPIC CHOLECYSTECTOMY  09-20-2007  @[facility]    POLYPECTOMY  08/13/2020   Procedure: POLYPECTOMY;  Surgeon: Harvel Quale, MD;  [facility];  Service: Gastroenterology;;   SHOULDER ARTHROSCOPY WITH ROTATOR CUFF REPAIR Right 10/17/2018   Procedure: Right shoulder arthroscopy with subacromial decompression, distal  clavicle resection,  rotator cuff DEBRIEDMENT, LABRIAL DEBRIDMENT;  Surgeon: Justice Britain, MD;  Location: [place];  Service: Orthopedics;  Laterality: Right;  12min    There were no vitals filed for this visit.   Subjective Assessment - 11/19/20 1355     Subjective A little sore today, around a 4/10 in the left shoulder    Patient Stated Goals get back to normal, strength, ROM    Currently in Pain? Yes    Pain Score 3     Pain Location Shoulder    Pain Orientation Left    Pain Descriptors / Indicators Aching;Sore    Pain Onset 1 to 4 weeks ago                [facility] PT Assessment - 11/19/20 0001       Assessment   Medical Diagnosis Impingement syndrome of L shoulder/ L RCR, SAD, DCR    Referring Provider (PT) Justice Britain MD    Onset Date/Surgical Date 10/03/20    Hand Dominance Right      AROM   AROM Assessment Site Shoulder    Right/Left Shoulder Left    Left Shoulder Flexion 150 Degrees    Left Shoulder ABduction 150 Degrees    Left Shoulder Internal Rotation --   T9   Left Shoulder External  Rotation --   can touch back of head     Strength   Left Shoulder Flexion 4/5    Left Shoulder Extension 4/5    Left Shoulder ABduction 4/5    Left Shoulder Internal Rotation 5/5    Left Shoulder External Rotation 4+/5                           [facility] Adult PT Treatment/Exercise - 11/19/20 0001       Shoulder Exercises: Standing   Protraction Strengthening;Both    Protraction Limitations 3x10 rolling foam roll up the wall through forearms for serratus anterior with red loop around wrists for ER    External Rotation Strengthening;Left;20 reps;Theraband    Theraband Level (Shoulder External Rotation) Level 3 (Green)    Extension Strengthening;Both;20 reps;Theraband    Theraband Level (Shoulder Extension) Level 3 (Green)    Row [employer];Theraband    Theraband Level (Shoulder Row) Level 3 (Green)    Other Standing Exercises  scaption 2x10 with foam roll length of back for proprioception    Other Standing Exercises rolling foam roll up the wall with top-end stretch 2 sec hold. External rotation quick catch 2x30 sec      Shoulder Exercises: ROM/Strengthening   UBE (Upper Arm Bike) level 1 2 min forward/backward      Manual Therapy   Manual therapy comments Manual complete separate than rest of tx    Joint Mobilization GH inferior glide Grade II-III                      PT Short Term Goals - 11/06/20 1032       PT SHORT TERM GOAL #1   Title Patient will be independent with HEP in order to improve functional outcomes.    Baseline 11/06/20:  Reports compliance with current HEP daily    Status Achieved      PT SHORT TERM GOAL #2   Title Patient will report at least 25% improvement in symptoms for improved quality of life.    Baseline 11/06/20:  Reports improvements by 50%, continues to have some weakness, ROM  and pain.    Status Achieved               PT Long Term Goals - 11/19/20 1431       PT LONG TERM GOAL #1   Title Patient will report at least 75% improvement in symptoms for improved quality of life.    Baseline 7/20:  Reports improvements by 50%, continues to have some weakness, ROM  and pain.    Status On-going      PT LONG TERM GOAL #2   Title Patient will demonstrate full L shoulder AROM in order to complete overhead tasks.    Baseline see ROM    Status On-going      PT LONG TERM GOAL #3   Title Patient will demonstrate 5/5 L shoulder strength in order to be able to lift objects at work.    Baseline 11/06/20:                    Patient will benefit from skilled therapeutic intervention in order to improve the following deficits and impairments:     Visit Diagnosis: Left shoulder pain, unspecified chronicity  Muscle weakness (generalized)  Stiffness of left shoulder, not elsewhere classified  Other symptoms and signs involving the musculoskeletal  system     Problem List  Patient Active Problem List   Diagnosis Date Noted   Tonsillar exudate 06/16/2020   Strep sore throat 06/16/2020   Acute rhinosinusitis 02/29/2020   Newly diagnosed diabetes ([place]) 01/13/2017   Type 2 diabetes mellitus without complications ([place]) 25/63/8937   Prediabetes 05/24/2016   Patellar tendinitis of left knee 12/11/2013   Morbid obesity ([place]) 05/09/2013   Osteoarthritis of left knee 11/09/2012   Obstructive sleep apnea 09/11/2012   HTN (hypertension) 09/11/2012   Impaired fasting glucose 09/11/2012   Degenerative arthritis of left knee 06/03/2011   S/P lateral meniscectomy of left knee 06/03/2011   BACK PAIN 05/01/2009   DERANGEMENT MENISCUS 09/04/2007   ELBOW PAIN, BILATERAL 04/06/2007   DEGENERATIVE JOINT DISEASE, KNEE 03/13/2007   Pain in joint, lower leg 03/13/2007   2:33 PM, 11/19/20 M. Sherlyn Lees, PT, DPT Physical Therapist- [place] Office Number: [phone]   [place] [address] [place], [place], [address] Phone: [phone]   Fax:  [phone]  Name: Adam Hahn MRN: [id number] Date of Birth: Sep 08, 1965

## 2020-11-20 ENCOUNTER — Other Ambulatory Visit: Payer: Self-pay

## 2020-11-20 DIAGNOSIS — E119 Type 2 diabetes mellitus without complications: Secondary | ICD-10-CM

## 2020-11-20 MED ORDER — SEMAGLUTIDE (1 MG/DOSE) 4 MG/3ML ~~LOC~~ SOPN
PEN_INJECTOR | SUBCUTANEOUS | 0 refills | Status: DC
Start: 2020-11-20 — End: 2020-11-24

## 2020-11-24 ENCOUNTER — Telehealth: Payer: Self-pay | Admitting: Family Medicine

## 2020-11-24 ENCOUNTER — Encounter (HOSPITAL_COMMUNITY): Payer: Self-pay

## 2020-11-24 ENCOUNTER — Ambulatory Visit (HOSPITAL_COMMUNITY): Payer: No Typology Code available for payment source

## 2020-11-24 ENCOUNTER — Other Ambulatory Visit: Payer: Self-pay

## 2020-11-24 DIAGNOSIS — M25512 Pain in left shoulder: Secondary | ICD-10-CM | POA: Diagnosis not present

## 2020-11-24 DIAGNOSIS — M6281 Muscle weakness (generalized): Secondary | ICD-10-CM

## 2020-11-24 DIAGNOSIS — R29898 Other symptoms and signs involving the musculoskeletal system: Secondary | ICD-10-CM

## 2020-11-24 DIAGNOSIS — E119 Type 2 diabetes mellitus without complications: Secondary | ICD-10-CM

## 2020-11-24 DIAGNOSIS — M25612 Stiffness of left shoulder, not elsewhere classified: Secondary | ICD-10-CM

## 2020-11-24 MED ORDER — SEMAGLUTIDE (1 MG/DOSE) 4 MG/3ML ~~LOC~~ SOPN: PEN_INJECTOR | SUBCUTANEOUS | 1 refills | Status: AC

## 2020-11-24 MED ORDER — SEMAGLUTIDE (1 MG/DOSE) 4 MG/3ML ~~LOC~~ SOPN: PEN_INJECTOR | SUBCUTANEOUS | 1 refills | Status: DC

## 2020-11-24 NOTE — Therapy (Signed)
[place], [place], [address] Phone: [phone]   Fax:  [phone]  Physical Therapy Treatment  Patient Details  Name: Adam Hahn MRN: [id number] Date of Birth: 1965-09-02 Referring Provider (PT): Justice Britain MD   Encounter Date: 11/24/2020   PT End of Session - 11/24/20 1120     Visit Number 11    Number of Visits 24    Date for PT Re-Evaluation 01/05/21    Authorization Type VA commuity care (15 visits approved - will need to request more visits)    Authorization - Visit Number 11    Authorization - Number of Visits 15    PT Start Time 1116    PT Stop Time 1200    PT Time Calculation (min) 44 min    Activity Tolerance Patient tolerated treatment well    Behavior During Therapy [facility] for tasks assessed/performed             Past Medical History:  Diagnosis Date   Allergic rhinitis    Arthritis    knees,  right shoulder ac joint   History of exercise stress test 10-09-2001 in epic   normal   HTN (hypertension)    OSA on CPAP    Shoulder impingement, right    Type 2 diabetes mellitus ([place])    followed by pcp   Wears glasses     Past Surgical History:  Procedure Laterality Date   COLONOSCOPY WITH PROPOFOL N/A 08/13/2020   Procedure: COLONOSCOPY WITH PROPOFOL;  Surgeon: Harvel Quale, MD;  [facility];  Service: Gastroenterology;  Laterality: N/A;  am   KNEE SURGERY Left x4  last one [place]  09-20-2007  '@[facility]'    POLYPECTOMY  08/13/2020   Procedure: POLYPECTOMY;  Surgeon: Harvel Quale, MD;  [facility];  Service: Gastroenterology;;   SHOULDER ARTHROSCOPY WITH ROTATOR CUFF REPAIR Right 10/17/2018   Procedure: Right shoulder arthroscopy with subacromial decompression, distal clavicle resection,  rotator cuff DEBRIEDMENT, LABRIAL DEBRIDMENT;  Surgeon: Justice Britain, MD;  Location: [place];  Service:  Orthopedics;  Laterality: Right;  148mn    There were no vitals filed for this visit.   Subjective Assessment - 11/24/20 1119     Subjective "A lot of clicking and popping in my left shoulder, feels pretty sore"    Patient Stated Goals get back to normal, strength, ROM    Currently in Pain? Yes    Pain Score 5     Pain Location Shoulder    Pain Orientation Left    Pain Descriptors / Indicators Aching;Sore    Pain Type Surgical pain    Pain Onset 1 to 4 weeks ago                [facility] 11/24[facility]20 0001       Assessment   Medical Diagnosis Impingement syndrome of L shoulder/ L RCR, SAD, DCR    Referring Provider (PT) KJustice BritainMD    Onset Date/Surgical Date 10/03/20    Hand Dominance Right    Next MD Visit 12/08/20                           [facility]       Shoulder Exercises: Standing   External Rotation Strengthening;Left;Theraband;Other (comment)   3x10   Theraband Level (Shoulder External Rotation) Level  3 (Green)    Row Strengthening;Left;20 reps;Weights    Row Weight (lbs) 4 plates    Retraction Strengthening;Both;20 reps;Theraband    Theraband Level (Shoulder Retraction) Level 3 (Green)      Shoulder Exercises: Pulleys   Flexion 2 minutes      Shoulder Exercises: ROM/Strengthening   UBE (Upper Arm Bike) level 1 2 min forward/backward    Wall Wash 3x10 LUE      Shoulder Exercises: Body Blade   Flexion 30 seconds    ABduction 30 seconds    External Rotation 30 seconds      Manual Therapy   Manual Therapy Passive ROM;Joint mobilization    Manual therapy comments Manual complete separate than rest of tx    Joint Mobilization GH inferior glide Grade II-III    Passive ROM PROM left shoulder 3x10 reps all planes. Demo motion WNL                      PT Short Term Goals - 11/06/20 1032       PT SHORT TERM GOAL #1   Title Patient will be independent with HEP in order to  improve functional outcomes.    Baseline 11/06/20:  Reports compliance with current HEP daily    Status Achieved      PT SHORT TERM GOAL #2   Title Patient will report at least 25% improvement in symptoms for improved quality of life.    Baseline 11/06/20:  Reports improvements by 50%, continues to have some weakness, ROM  and pain.    Status Achieved               PT Long Term Goals - 11/19/20 1431       PT LONG TERM GOAL #1   Title Patient will report at least 75% improvement in symptoms for improved quality of life.    Baseline 7/20:  Reports improvements by 50%, continues to have some weakness, ROM  and pain.    Status On-going      PT LONG TERM GOAL #2   Title Patient will demonstrate full L shoulder AROM in order to complete overhead tasks.    Baseline see ROM    Status On-going      PT LONG TERM GOAL #3   Title Patient will demonstrate 5/5 L shoulder strength in order to be able to lift objects at work.    Baseline 11/06/20:                   Plan - 11/24/20 1209     Clinical Impression Statement Demonstrates full, pain-free PROM for left shoulder. Reports some pain in left shoulder when sidelying. Observed performing left shoulder AROM WNL without signs of pain. Continued with resisted activities and able to tolerate increased resistance and repetition with PRE for left shoulder.  Continued sessions indicated to improve strength/stability to prepare for work-related tasks    Personal Factors and Comorbidities Comorbidity 3+;Time since onset of injury/illness/exacerbation;Fitness    Comorbidities Arthritis, Back pain, BMI over 30, Diabetes II, High Blood Pressure    Examination-Activity Limitations Reach Overhead;Bend;Lift;Carry;Caring for Others;Hygiene/Grooming;Dressing    Examination-Participation Restrictions Cleaning;Occupation;Meal Prep;Driving;Community Activity;Laundry;Volunteer;Shop    Stability/Clinical Decision Making Stable/Uncomplicated    Rehab  Potential Good    PT Frequency --   1-2/week   PT Duration 12 weeks    PT Treatment/Interventions ADLs/Self Care Home Management;Aquatic Therapy;Cryotherapy;Electrical Stimulation;Iontophoresis 72m/ml Dexamethasone;Moist Heat;Traction;Ultrasound;DME Instruction;Gait training;Stair training;Functional mobility training;Therapeutic activities;Therapeutic exercise;Balance training;Neuromuscular re-education;Patient/family education;Manual techniques;Orthotic  Fit/Training;Compression bandaging;Dry needling;Energy conservation;Splinting;Taping;Vasopneumatic Device;Spinal Manipulations;Joint Manipulations;Scar mobilization;Passive range of motion    PT Next Visit Plan Counter-top push-ups    PT Home Exercise Plan table slides flexion, abduction, pendulums; AAROM flexion/ER in supine, wall walk flexion/abduction, scap retraction 6/27 shoulder ER, IR isometrics, sideyling ER, prone extension, horzontal abd 7/11 shoulder flexion with band between hands, scap ret with [place] ER, horizontal abd, PNF D2 with green band for all    Consulted and Agree with Plan of Care Patient             Patient will benefit from skilled therapeutic intervention in order to improve the following deficits and impairments:  Decreased range of motion, Impaired UE functional use, Increased muscle spasms, Decreased activity tolerance, Decreased knowledge of precautions, Pain, Hypomobility, Impaired flexibility, Improper body mechanics, Decreased mobility, Decreased strength  Visit Diagnosis: Left shoulder pain, unspecified chronicity  Muscle weakness (generalized)  Stiffness of left shoulder, not elsewhere classified  Other symptoms and signs involving the musculoskeletal system     Problem List Patient Active Problem List   Diagnosis Date Noted   Tonsillar exudate 06/16/2020   Strep sore throat 06/16/2020   Acute rhinosinusitis 02/29/2020   Newly diagnosed diabetes ([place]) 01/13/2017   Type 2 diabetes mellitus without  complications ([place]) 20/60/1561   Prediabetes 05/24/2016   Patellar tendinitis of left knee 12/11/2013   Morbid obesity ([place]) 05/09/2013   Osteoarthritis of left knee 11/09/2012   Obstructive sleep apnea 09/11/2012   HTN (hypertension) 09/11/2012   Impaired fasting glucose 09/11/2012   Degenerative arthritis of left knee 06/03/2011   S/P lateral meniscectomy of left knee 06/03/2011   BACK PAIN 05/01/2009   DERANGEMENT MENISCUS 09/04/2007   ELBOW PAIN, BILATERAL 04/06/2007   DEGENERATIVE JOINT DISEASE, KNEE 03/13/2007   Pain in joint, lower leg 03/13/2007   12:12 PM, 11/24/20 M. Sherlyn Lees, PT, DPT Physical Therapist- [place] Office Number: [phone]   [place] [address] [place], [place], [address] Phone: [phone]   Fax:  [phone]  Name: Adam Hahn MRN: [id number] Date of Birth: 1966-04-27

## 2020-11-24 NOTE — Telephone Encounter (Signed)
Patient states he would like to keep his August appt as scheduled for follow up on Ozempic.

## 2020-11-24 NOTE — Telephone Encounter (Signed)
  Have pt f/u in office 01/01/21-01/10/21 for dm2 follow up and ozempic medications.  Repeat labs cmp and a1c.   Thx   Dr. Lovena Le

## 2020-11-24 NOTE — Telephone Encounter (Signed)
He can move his appt to first week in sept if he wants, then it will coincide with his labs. Then cancel the aug appt. Unless he just wants to stay on with the aug appt.  Labs would be too early   Thx.   Dr. Lovena Le

## 2020-11-26 DIAGNOSIS — M9903 Segmental and somatic dysfunction of lumbar region: Secondary | ICD-10-CM | POA: Diagnosis not present

## 2020-11-26 DIAGNOSIS — M9902 Segmental and somatic dysfunction of thoracic region: Secondary | ICD-10-CM | POA: Diagnosis not present

## 2020-11-26 DIAGNOSIS — M546 Pain in thoracic spine: Secondary | ICD-10-CM | POA: Diagnosis not present

## 2020-11-26 DIAGNOSIS — M62830 Muscle spasm of back: Secondary | ICD-10-CM | POA: Diagnosis not present

## 2020-11-28 ENCOUNTER — Other Ambulatory Visit: Payer: Self-pay

## 2020-11-28 ENCOUNTER — Ambulatory Visit (HOSPITAL_COMMUNITY): Payer: No Typology Code available for payment source | Admitting: Physical Therapy

## 2020-11-28 DIAGNOSIS — M25512 Pain in left shoulder: Secondary | ICD-10-CM

## 2020-11-28 DIAGNOSIS — M6281 Muscle weakness (generalized): Secondary | ICD-10-CM

## 2020-11-28 DIAGNOSIS — M25612 Stiffness of left shoulder, not elsewhere classified: Secondary | ICD-10-CM

## 2020-11-28 NOTE — Therapy (Signed)
[place], [place], [address] Phone: [phone]   Fax:  [phone]  Physical Therapy Treatment  Patient Details  Name: Adam Hahn MRN: [id number] Date of Birth: 03/03/1966 Referring Provider (PT): Justice Britain MD   Encounter Date: 11/28/2020   PT End of Session - 11/28/20 1154     Visit Number 12    Number of Visits 24    Date for PT Re-Evaluation 01/05/21    Authorization Type VA commuity care (15 visits approved - will need to request more visits)    Authorization - Visit Number 12    Authorization - Number of Visits 15    PT Start Time 1057    PT Stop Time 1136    PT Time Calculation (min) 39 min    Activity Tolerance Patient tolerated treatment well    Behavior During Therapy [facility] for tasks assessed/performed             Past Medical History:  Diagnosis Date   Allergic rhinitis    Arthritis    knees,  right shoulder ac joint   History of exercise stress test 10-09-2001 in epic   normal   HTN (hypertension)    OSA on CPAP    Shoulder impingement, right    Type 2 diabetes mellitus ([place])    followed by pcp   Wears glasses     Past Surgical History:  Procedure Laterality Date   COLONOSCOPY WITH PROPOFOL N/A 08/13/2020   Procedure: COLONOSCOPY WITH PROPOFOL;  Surgeon: Harvel Quale, MD;  [facility];  Service: Gastroenterology;  Laterality: N/A;  am   KNEE SURGERY Left x4  last one 1996   LAPAROSCOPIC CHOLECYSTECTOMY  09-20-2007  '@[facility]'$    POLYPECTOMY  08/13/2020   Procedure: POLYPECTOMY;  Surgeon: Harvel Quale, MD;  [facility];  Service: Gastroenterology;;   SHOULDER ARTHROSCOPY WITH ROTATOR CUFF REPAIR Right 10/17/2018   Procedure: Right shoulder arthroscopy with subacromial decompression, distal clavicle resection,  rotator cuff DEBRIEDMENT, LABRIAL DEBRIDMENT;  Surgeon: Justice Britain, MD;  Location: [place];  Service:  Orthopedics;  Laterality: Right;  150mn    There were no vitals filed for this visit.   Subjective Assessment - 11/28/20 1100     Subjective Pt reports 4/10 in Lt shoulder with continued popping and clicking.  States irritation at night when he's starting to sleep.    Currently in Pain? Yes    Pain Score 4     Pain Location Shoulder    Pain Orientation Left    Pain Descriptors / Indicators Aching;Sore    Pain Type Surgical pain                               [facility] Adult PT Treatment/Exercise - 11/28/20 0001       Shoulder Exercises: Standing   Flexion 20 reps    Shoulder Flexion Weight (lbs) 2#    Flexion Limitations 2 sets of 10 reps    ABduction 20 reps    Shoulder ABduction Weight (lbs) 2#    ABduction Limitations 2 sets of 10 reps    Other Standing Exercises bicep and tricep curls 4# 2X10      Shoulder Exercises: Pulleys   Flexion 2 minutes      Shoulder Exercises: ROM/Strengthening   UBE (Upper Arm Bike) level 1 4' total;  2 min forward 2 min backward  Wall Wash each direction 30" X 2 bouts (30" each up/down, lt/rt, CW, CCW)    Wall Pushups 10 reps    Modified Plank 30 seconds;2 reps      Shoulder Exercises: Stretch   Wall Stretch - Flexion 5 reps;10 seconds    Wall Stretch - ABduction 5 reps;10 seconds      Shoulder Exercises: Body Blade   Flexion 30 seconds;2 reps    ABduction 30 seconds;2 reps    External Rotation 30 seconds;2 reps                      PT Short Term Goals - 11/06/20 1032       PT SHORT TERM GOAL #1   Title Patient will be independent with HEP in order to improve functional outcomes.    Baseline 11/06/20:  Reports compliance with current HEP daily    Status Achieved      PT SHORT TERM GOAL #2   Title Patient will report at least 25% improvement in symptoms for improved quality of life.    Baseline 11/06/20:  Reports improvements by 50%, continues to have some weakness, ROM  and pain.    Status Achieved                PT Long Term Goals - 11/19/20 1431       PT LONG TERM GOAL #1   Title Patient will report at least 75% improvement in symptoms for improved quality of life.    Baseline 7/20:  Reports improvements by 50%, continues to have some weakness, ROM  and pain.    Status On-going      PT LONG TERM GOAL #2   Title Patient will demonstrate full L shoulder AROM in order to complete overhead tasks.    Baseline see ROM    Status On-going      PT LONG TERM GOAL #3   Title Patient will demonstrate 5/5 L shoulder strength in order to be able to lift objects at work.    Baseline 11/06/20:                   Plan - 11/28/20 1157     Clinical Impression Statement focused session today on strenghening of LT UE.  completed 2 sets of all exercises with addition of modified planks and wall push ups to progress stability.  Challenge noted with body blade and wall washing during the second set due to fatigue.  Added weights with standing UE flexion, abduction, bicep and tricep curls with noted weakness.  Pt instructed to continue self stretching with introduction of wall slides for flexion/abduction and to work on strengthening as well.  Pt without any complaints or issues during or after session today.    PT Next Visit Plan Continue to progress strength and stabiliy of Lt LE.             Patient will benefit from skilled therapeutic intervention in order to improve the following deficits and impairments:  Decreased range of motion, Impaired UE functional use, Increased muscle spasms, Decreased activity tolerance, Decreased knowledge of precautions, Pain, Hypomobility, Impaired flexibility, Improper body mechanics, Decreased mobility, Decreased strength  Visit Diagnosis: Left shoulder pain, unspecified chronicity  Muscle weakness (generalized)  Stiffness of left shoulder, not elsewhere classified     Problem List Patient Active Problem List   Diagnosis Date Noted    Tonsillar exudate 06/16/2020   Strep sore throat 06/16/2020   Acute rhinosinusitis 02/29/2020  Newly diagnosed diabetes ([place]) 01/13/2017   Type 2 diabetes mellitus without complications ([place]) 123XX123   Prediabetes 05/24/2016   Patellar tendinitis of left knee 12/11/2013   Morbid obesity ([place]) 05/09/2013   Osteoarthritis of left knee 11/09/2012   Obstructive sleep apnea 09/11/2012   HTN (hypertension) 09/11/2012   Impaired fasting glucose 09/11/2012   Degenerative arthritis of left knee 06/03/2011   S/P lateral meniscectomy of left knee 06/03/2011   BACK PAIN 05/01/2009   DERANGEMENT MENISCUS 09/04/2007   ELBOW PAIN, BILATERAL 04/06/2007   DEGENERATIVE JOINT DISEASE, KNEE 03/13/2007   Pain in joint, lower leg 03/13/2007   Teena Irani, PTA/CLT [phone]  Teena Irani 11/28/2020, 12:00 PM  [facility] [address] [place], [place], [address] Phone: [phone]   Fax:  [phone]  Name: Adam Hahn MRN: [id number] Date of Birth: [birth year]

## 2020-12-01 ENCOUNTER — Encounter (HOSPITAL_COMMUNITY): Payer: BC Managed Care – PPO | Admitting: Physical Therapy

## 2020-12-03 ENCOUNTER — Encounter (HOSPITAL_COMMUNITY): Payer: BC Managed Care – PPO | Admitting: Physical Therapy

## 2020-12-08 ENCOUNTER — Ambulatory Visit (HOSPITAL_COMMUNITY): Payer: No Typology Code available for payment source | Attending: Orthopedic Surgery

## 2020-12-08 ENCOUNTER — Other Ambulatory Visit: Payer: Self-pay

## 2020-12-08 DIAGNOSIS — M6281 Muscle weakness (generalized): Secondary | ICD-10-CM | POA: Diagnosis present

## 2020-12-08 DIAGNOSIS — M25512 Pain in left shoulder: Secondary | ICD-10-CM | POA: Diagnosis present

## 2020-12-08 DIAGNOSIS — M25612 Stiffness of left shoulder, not elsewhere classified: Secondary | ICD-10-CM

## 2020-12-08 DIAGNOSIS — R29898 Other symptoms and signs involving the musculoskeletal system: Secondary | ICD-10-CM | POA: Diagnosis present

## 2020-12-08 NOTE — Therapy (Signed)
[place], [place], [address] Phone: [phone]   Fax:  [phone]  Physical Therapy Treatment  Patient Details  Name: Adam Hahn MRN: [id number] Date of Birth: [birth year] Referring Provider (PT): Justice Britain MD   Encounter Date: 12/08/2020   PT End of Session - 12/08/20 1124     Visit Number 13    Number of Visits 24    Date for PT Re-Evaluation 01/05/21    Authorization Type VA commuity care (15 visits approved - will need to request more visits)    Authorization - Visit Number 13    Authorization - Number of Visits 15    PT Start Time 1115    PT Stop Time 1200    PT Time Calculation (min) 45 min    Activity Tolerance Patient tolerated treatment well    Behavior During Therapy [facility] for tasks assessed/performed             Past Medical History:  Diagnosis Date   Allergic rhinitis    Arthritis    knees,  right shoulder ac joint   History of exercise stress test 10-09-2001 in epic   normal   HTN (hypertension)    OSA on CPAP    Shoulder impingement, right    Type 2 diabetes mellitus ([place])    followed by pcp   Wears glasses     Past Surgical History:  Procedure Laterality Date   COLONOSCOPY WITH PROPOFOL N/A 08/13/2020   Procedure: COLONOSCOPY WITH PROPOFOL;  Surgeon: Harvel Quale, MD;  [facility];  Service: Gastroenterology;  Laterality: N/A;  am   KNEE SURGERY Left x4  last one 1996   LAPAROSCOPIC CHOLECYSTECTOMY  09-20-2007  '@[facility]'$    POLYPECTOMY  08/13/2020   Procedure: POLYPECTOMY;  Surgeon: Harvel Quale, MD;  [facility];  Service: Gastroenterology;;   SHOULDER ARTHROSCOPY WITH ROTATOR CUFF REPAIR Right 10/17/2018   Procedure: Right shoulder arthroscopy with subacromial decompression, distal clavicle resection,  rotator cuff DEBRIEDMENT, LABRIAL DEBRIDMENT;  Surgeon: Justice Britain, MD;  Location: [place];  Service:  Orthopedics;  Laterality: Right;  166mn    There were no vitals filed for this visit.   Subjective Assessment - 12/08/20 1201     Subjective Discomfort in left shoulder at night and wakes from sleep around 2-3AM from shoulder discomfort                [facility] PT Assessment - 12/08/20 0001       Assessment   Medical Diagnosis Impingement syndrome of L shoulder/ L RCR, SAD, DCR    Referring Provider (PT) KJustice BritainMD    Onset Date/Surgical Date 10/03/20    Next MD Visit 12/08/20                           [facility]       Shoulder Exercises: Standing   Protraction Strengthening;Both;20 reps    Protraction Weight (lbs) countertop push-ups    External Rotation Strengthening;Left    External Rotation Weight (lbs) 2 plates 3x10    Internal Rotation Strengthening;Left    Internal Rotation Weight (lbs) 2 plates 3x10    Row Strengthening;Left   3x10   Row Weight (lbs) 4 plates    Other Standing Exercises abduction/external rotation "quick catch" 2x30 sec 1 kg    Other Standing Exercises shoulder oscillations holding 1  kg ball 4x30 sec      Shoulder Exercises: ROM/Strengthening   UBE (Upper Arm Bike) level 1 4' total;  2 min forward 2 min backward    Wall Wash each direction 30" X 2 bouts (30" each up/down, lt/rt, CW, CCW)                    PT Education - 12/08/20 1202     Education Details education on performance of WBing exercises to improve strength in LUE    Person(s) Educated Patient    Methods Explanation;Demonstration    Comprehension Verbalized understanding              PT Short Term Goals - 11/06/20 1032       PT SHORT TERM GOAL #1   Title Patient will be independent with HEP in order to improve functional outcomes.    Baseline 11/06/20:  Reports compliance with current HEP daily    Status Achieved      PT SHORT TERM GOAL #2   Title Patient will report at least 25% improvement in  symptoms for improved quality of life.    Baseline 11/06/20:  Reports improvements by 50%, continues to have some weakness, ROM  and pain.    Status Achieved               PT Long Term Goals - 12/08/20 1205       PT LONG TERM GOAL #1   Title Patient will report at least 75% improvement in symptoms for improved quality of life.    Baseline 7/20:  Reports improvements by 50%, continues to have some weakness, ROM  and pain.    Status On-going      PT LONG TERM GOAL #2   Title Patient will demonstrate full L shoulder AROM in order to complete overhead tasks.    Baseline see ROM    Status On-going      PT LONG TERM GOAL #3   Title Patient will demonstrate 5/5 L shoulder strength in order to be able to lift objects at work.    Baseline 5/5 gross LUE strength    Status Achieved                   Plan - 12/08/20 1202     Clinical Impression Statement Tolerating increased resistance exercises well without adverse effects other than report of soreness the next day which usually resolves with continued use of LUE. Demonstrating improve LUE strength with gross 5/5 strength appreciated and performing heavy resistance pulling exercises with LUE (left UE row with 40 lbs).  No gross instability in left shoulder noted. Pt is progressing well with POC details and inticipate D/C to HEP after next visit    Personal Factors and Comorbidities Comorbidity 3+;Time since onset of injury/illness/exacerbation;Fitness    Comorbidities Arthritis, Back pain, BMI over 30, Diabetes II, High Blood Pressure    Examination-Activity Limitations Reach Overhead;Bend;Lift;Carry;Caring for Others;Hygiene/Grooming;Dressing    Examination-Participation Restrictions Cleaning;Occupation;Meal Prep;Driving;Community Activity;Laundry;Volunteer;Shop    Rehab Potential Good    PT Duration 12 weeks    PT Treatment/Interventions ADLs/Self Care Home Management;Aquatic Therapy;Cryotherapy;Electrical  Stimulation;Iontophoresis '4mg'$ /ml Dexamethasone;Moist Heat;Traction;Ultrasound;DME Instruction;Gait training;Stair training;Functional mobility training;Therapeutic activities;Therapeutic exercise;Balance training;Neuromuscular re-education;Patient/family education;Manual techniques;Orthotic Fit/Training;Compression bandaging;Dry needling;Energy conservation;Splinting;Taping;Vasopneumatic Device;Spinal Manipulations;Joint Manipulations;Scar mobilization;Passive range of motion    PT Next Visit Plan HEP refinement and anticipate D/C    Consulted and Agree with Plan of Care Patient  Patient will benefit from skilled therapeutic intervention in order to improve the following deficits and impairments:  Decreased range of motion, Impaired UE functional use, Increased muscle spasms, Decreased activity tolerance, Decreased knowledge of precautions, Pain, Hypomobility, Impaired flexibility, Improper body mechanics, Decreased mobility, Decreased strength  Visit Diagnosis: Left shoulder pain, unspecified chronicity  Muscle weakness (generalized)  Stiffness of left shoulder, not elsewhere classified  Other symptoms and signs involving the musculoskeletal system     Problem List Patient Active Problem List   Diagnosis Date Noted   Tonsillar exudate 06/16/2020   Strep sore throat 06/16/2020   Acute rhinosinusitis 02/29/2020   Newly diagnosed diabetes ([place]) 01/13/2017   Type 2 diabetes mellitus without complications ([place]) 123XX123   Prediabetes 05/24/2016   Patellar tendinitis of left knee 12/11/2013   Morbid obesity ([place]) 05/09/2013   Osteoarthritis of left knee 11/09/2012   Obstructive sleep apnea 09/11/2012   HTN (hypertension) 09/11/2012   Impaired fasting glucose 09/11/2012   Degenerative arthritis of left knee 06/03/2011   S/P lateral meniscectomy of left knee 06/03/2011   BACK PAIN 05/01/2009   DERANGEMENT MENISCUS 09/04/2007   ELBOW PAIN, BILATERAL 04/06/2007    DEGENERATIVE JOINT DISEASE, KNEE 03/13/2007   Pain in joint, lower leg 03/13/2007   12:06 PM, 12/08/20 M. Sherlyn Lees, PT, DPT Physical Therapist- [place] Office Number: [phone]   [facility] [address] [place], [place], [address] Phone: [phone]   Fax:  [phone]  Name: Adam Hahn MRN: [id number] Date of Birth: 11-Feb-1966

## 2020-12-10 ENCOUNTER — Ambulatory Visit (HOSPITAL_COMMUNITY): Payer: No Typology Code available for payment source

## 2020-12-11 ENCOUNTER — Encounter: Payer: Self-pay | Admitting: Family Medicine

## 2020-12-11 ENCOUNTER — Other Ambulatory Visit: Payer: Self-pay

## 2020-12-11 ENCOUNTER — Ambulatory Visit: Payer: BC Managed Care – PPO | Admitting: Family Medicine

## 2020-12-11 VITALS — BP 130/90 | HR 95 | Temp 96.8°F | Ht 73.0 in | Wt 296.0 lb

## 2020-12-11 DIAGNOSIS — Z713 Dietary counseling and surveillance: Secondary | ICD-10-CM

## 2020-12-11 DIAGNOSIS — I10 Essential (primary) hypertension: Secondary | ICD-10-CM

## 2020-12-11 DIAGNOSIS — E119 Type 2 diabetes mellitus without complications: Secondary | ICD-10-CM

## 2020-12-11 MED ORDER — DILTIAZEM HCL ER COATED BEADS 300 MG PO CP24: 300.0000 mg | ORAL_CAPSULE | Freq: Every day | ORAL | 1 refills | Status: DC

## 2020-12-11 NOTE — Progress Notes (Signed)
Patient ID: Adam Hahn, male    DOB: 19-Jun-1965, 55 y.o.   MRN: [id number]   Chief Complaint  Patient presents with   Diabetes    Ozempic follow up   Subjective:    HPI Pt seen for f/u DM2 and weight loss management pt wanted to start ozempic to help with weight loss after his last visit.  Pt was rushing today and bp slight elevated.  Checking bp at home seeing 124/87 range at home.  Pt had rash with lisinopril and valsartan. Pt is taking verapamil w/o issues.  Dm2- 30 day average is 87 since starting the ozempic.  Pt stating his fasting bg was 87 this am. Pt is still taking metformin and glipizide.  Pt is on '1mg'$  dose of ozempic weekly.  Pt received 1 pen of ozempic per month.  Pt has had some weight increase.  Was on vacation and eating off the diet.  Drinking machiatto. Lunch- pizza bites Breakfast- 2 eggs, cornbeef hash. Drinks- Art gallery manager. Dinner- taco salad.   Medical History Adam Hahn has a past medical history of Allergic rhinitis, Arthritis, History of exercise stress test (10-09-2001 in epic), HTN (hypertension), Newly diagnosed diabetes ([place]) (01/13/2017), OSA on CPAP, Shoulder impingement, right, Type 2 diabetes mellitus ([place]), and Wears glasses.   Outpatient Encounter Medications as of 12/11/2020  Medication Sig   cholestyramine (QUESTRAN) 4 GM/DOSE powder USE 1/2 SCOOP once DAILY AS DIRECTED.   diltiazem (CARDIZEM CD) 300 MG 24 hr capsule Take 1 capsule (300 mg total) by mouth daily.   glipiZIDE (GLUCOTROL) 5 MG tablet Take 1 tablet (5 mg total) by mouth 2 (two) times daily before a meal. Getting meds from [place].   indomethacin (INDOCIN) 25 MG capsule Take 1 capsule (25 mg total) by mouth 2 (two) times daily with a meal.   metFORMIN (GLUCOPHAGE) 1000 MG tablet TAKE (1) TABLET BY MOUTH TWICE A DAY WITH MEALS (BREAKFAST AND SUPPER).   Semaglutide, 1 MG/DOSE, 4 MG/3ML SOPN Inject SQ once per week '1mg'$  for 4 wks.   [DISCONTINUED] verapamil (CALAN-SR) 240 MG CR  tablet Take 1 tablet (240 mg total) by mouth in the morning.   No facility-administered encounter medications on file as of 12/11/2020.     Review of Systems  Constitutional:  Negative for chills and fever.  HENT:  Negative for congestion, rhinorrhea and sore throat.   Respiratory:  Negative for cough, shortness of breath and wheezing.   Cardiovascular:  Negative for chest pain and leg swelling.  Gastrointestinal:  Negative for abdominal pain, diarrhea, nausea and vomiting.  Genitourinary:  Negative for dysuria and frequency.  Skin:  Negative for rash.  Neurological:  Negative for dizziness, weakness and headaches.    Vitals BP 130/90   Pulse 95   Temp (!) 96.8 F (36 C)   Ht '6\' 1"'$  (1.854 m)   Wt 296 lb (134.3 kg)   SpO2 98%   BMI 39.05 kg/m   Objective:   Physical Exam Vitals and nursing note reviewed.  Constitutional:      General: He is not in acute distress.    Appearance: Normal appearance. He is obese. He is not ill-appearing.  HENT:     Head: Normocephalic.     Nose: Nose normal. No congestion.     Mouth/Throat:     Mouth: Mucous membranes are moist.     Pharynx: No oropharyngeal exudate.  Eyes:     Extraocular Movements: Extraocular movements intact.     Conjunctiva/sclera: Conjunctivae normal.  Pupils: Pupils are equal, round, and reactive to light.  Cardiovascular:     Rate and Rhythm: Normal rate and regular rhythm.     Pulses: Normal pulses.     Heart sounds: Normal heart sounds. No murmur heard. Pulmonary:     Effort: Pulmonary effort is normal.     Breath sounds: Normal breath sounds. No wheezing, rhonchi or rales.  Musculoskeletal:        General: Normal range of motion.     Right lower leg: No edema.     Left lower leg: No edema.  Skin:    General: Skin is warm and dry.     Findings: No rash.  Neurological:     General: No focal deficit present.     Mental Status: He is alert and oriented to person, place, and time.     Cranial Nerves:  No cranial nerve deficit.  Psychiatric:        Mood and Affect: Mood normal.        Behavior: Behavior normal.        Thought Content: Thought content normal.        Judgment: Judgment normal.     Assessment and Plan   1. Type 2 diabetes mellitus without complication, without long-term current use of insulin ([place])  2. Weight loss counseling, encounter for  3. Primary hypertension  4. Morbid obesity ([place])   Htn- suboptimal.  Will change medication from verapamil '240mg'$  CR to diltiazem '300mg'$  CD.  Dm2 and obesity- Pt started 1 mo ago on ozempic and is up to taking the '1mg'$  dose weekly.  Feeling the glucose is more controlled. However, pt gained weight, since starting ozempic, 11 lbs. Pt not watching his diet. Discussed diabetic diet and inc in exercising. Pt given handout on diabetic diet.  Reviewed with pt needing a 4% weight loss if pt is going to be having a successful response on the ozempic.  Pt advised ot cont to check bg and eat diabetic diet and increase in exercising.  Wt Readings from Last 3 Encounters:  12/11/20 296 lb (134.3 kg)  10/10/20 285 lb (129.3 kg)  08/13/20 285 lb (129.3 kg)    BP Readings from Last 3 Encounters:  12/11/20 130/90  10/10/20 (!) 130/95  08/13/20 100/63    Return in about 4 weeks (around 01/08/2021) for f/u weight loss, dm2.  Counseling time- spent greater than 64mn on counseling patient, coordination of care, reviewing labs and discussion of weight/diet and charting.

## 2020-12-15 ENCOUNTER — Ambulatory Visit (HOSPITAL_COMMUNITY): Payer: No Typology Code available for payment source | Admitting: Physical Therapy

## 2020-12-15 ENCOUNTER — Other Ambulatory Visit: Payer: Self-pay

## 2020-12-15 ENCOUNTER — Encounter (HOSPITAL_COMMUNITY): Payer: Self-pay | Admitting: Physical Therapy

## 2020-12-15 DIAGNOSIS — R29898 Other symptoms and signs involving the musculoskeletal system: Secondary | ICD-10-CM

## 2020-12-15 DIAGNOSIS — M25512 Pain in left shoulder: Secondary | ICD-10-CM | POA: Diagnosis not present

## 2020-12-15 DIAGNOSIS — M25612 Stiffness of left shoulder, not elsewhere classified: Secondary | ICD-10-CM

## 2020-12-15 DIAGNOSIS — M6281 Muscle weakness (generalized): Secondary | ICD-10-CM

## 2020-12-15 NOTE — Patient Instructions (Signed)
Access Code: [email or web address] URL: [email or web address] Date: 12/15/2020 Prepared by: Margie Billet  Exercises Standing Shoulder Single Arm PNF D2 Flexion with Resistance (Mirrored) - 1 x daily - 7 x weekly - 3 sets - 10 reps

## 2020-12-15 NOTE — Therapy (Signed)
[place], [place], [address] Phone: [phone]   Fax:  [phone]  Physical Therapy Treatment  Patient Details  Name: Adam Hahn MRN: [id number] Date of Birth: 08/12/1965 Referring Provider (PT): Justice Britain MD   Encounter Date: 12/15/2020   PT End of Session - 12/15/20 1043     Visit Number 14    Number of Visits 24    Date for PT Re-Evaluation 01/05/21    Authorization Type VA commuity care (15 visits approved - will need to request more visits)    Authorization - Visit Number 14    Authorization - Number of Visits 15    PT Start Time 1045    PT Stop Time 1125    PT Time Calculation (min) 40 min    Activity Tolerance Patient tolerated treatment well    Behavior During Therapy [facility] for tasks assessed/performed             Past Medical History:  Diagnosis Date   Allergic rhinitis    Arthritis    knees,  right shoulder ac joint   History of exercise stress test 10-09-2001 in epic   normal   HTN (hypertension)    Newly diagnosed diabetes ([place]) 01/13/2017   OSA on CPAP    Shoulder impingement, right    Type 2 diabetes mellitus ([place])    followed by pcp   Wears glasses     Past Surgical History:  Procedure Laterality Date   COLONOSCOPY WITH PROPOFOL N/A 08/13/2020   Procedure: COLONOSCOPY WITH PROPOFOL;  Surgeon: Harvel Quale, MD;  [facility];  Service: Gastroenterology;  Laterality: N/A;  am   KNEE SURGERY Left x4  last one 1996   LAPAROSCOPIC CHOLECYSTECTOMY  09-20-2007  '@[facility]'    POLYPECTOMY  08/13/2020   Procedure: POLYPECTOMY;  Surgeon: Harvel Quale, MD;  [facility];  Service: Gastroenterology;;   SHOULDER ARTHROSCOPY WITH ROTATOR CUFF REPAIR Right 10/17/2018   Procedure: Right shoulder arthroscopy with subacromial decompression, distal clavicle resection,  rotator cuff DEBRIEDMENT, LABRIAL DEBRIDMENT;  Surgeon: Justice Britain, MD;   Location: [facility];  Service: Orthopedics;  Laterality: Right;  132mn    There were no vitals filed for this visit.   Subjective Assessment - 12/15/20 1045     Subjective Patient states should 3-4/10. Exercises going alright.    Currently in Pain? Yes    Pain Score 4     Pain Location Shoulder    Pain Orientation Left    Pain Descriptors / Indicators Sore    Pain Type Surgical pain    Pain Onset More than a month ago    Pain Frequency Constant                               [facility] Adult PT Treatment/Exercise - 12/15/20 0001       Shoulder Exercises: Standing   External Rotation Strengthening;Left    External Rotation Weight (lbs) 2 plates 3x10    Internal Rotation Strengthening;Left    Internal Rotation Weight (lbs) 2 plates 3x10    Flexion 20 reps    Shoulder Flexion Weight (lbs) 4#    Flexion Limitations 2 sets of 10 reps    ABduction 20 reps    Shoulder ABduction Weight (lbs) 4#    ABduction Limitations 2 sets of 10 reps    Row Strengthening;Left    Row  Weight (lbs) 4 plates; 3x 10    Other Standing Exercises abduction/external rotation "quick catch" 4x30 sec 1 kg      Shoulder Exercises: ROM/Strengthening   UBE (Upper Arm Bike) level 2 4' total;  2 min forward 2 min backward    Wall Wash with overhead reach 10x up/down, L/R, CW, CCW - 3 sets    Wall Pushups 10 reps    Wall Pushups Limitations 3 sets at counter    Other ROM/Strengthening Exercises PNF D2 3x 10 L shoulder                    PT Education - 12/15/20 1045     Education Details HEP    Person(s) Educated Patient    Methods Explanation    Comprehension Verbalized understanding              PT Short Term Goals - 11/06/20 1032       PT SHORT TERM GOAL #1   Title Patient will be independent with HEP in order to improve functional outcomes.    Baseline 11/06/20:  Reports compliance with current HEP daily    Status Achieved      PT SHORT TERM GOAL  #2   Title Patient will report at least 25% improvement in symptoms for improved quality of life.    Baseline 11/06/20:  Reports improvements by 50%, continues to have some weakness, ROM  and pain.    Status Achieved               PT Long Term Goals - 12/08/20 1205       PT LONG TERM GOAL #1   Title Patient will report at least 75% improvement in symptoms for improved quality of life.    Baseline 7/20:  Reports improvements by 50%, continues to have some weakness, ROM  and pain.    Status On-going      PT LONG TERM GOAL #2   Title Patient will demonstrate full L shoulder AROM in order to complete overhead tasks.    Baseline see ROM    Status On-going      PT LONG TERM GOAL #3   Title Patient will demonstrate 5/5 L shoulder strength in order to be able to lift objects at work.    Baseline 5/5 gross LUE strength    Status Achieved                   Plan - 12/15/20 1044     Clinical Impression Statement Continued with strengthening and HEP development. Patient demonstrates good mechanics with previously completed exercises. Patient showing improving overhead strength and endurance with overhead wall wash exercise. Patient with good mechanics with PNF D2 pattern in standing without compensation. Patient with intermittent c/o L shoulder pain during session. Anticipate D/c next session. Patient will continue to benefit from skilled physical therapy in order to reduce impairment and improve function.    Personal Factors and Comorbidities Comorbidity 3+;Time since onset of injury/illness/exacerbation;Fitness    Comorbidities Arthritis, Back pain, BMI over 30, Diabetes II, High Blood Pressure    Examination-Activity Limitations Reach Overhead;Bend;Lift;Carry;Caring for Others;Hygiene/Grooming;Dressing    Examination-Participation Restrictions Cleaning;Occupation;Meal Prep;Driving;Community Activity;Laundry;Volunteer;Shop    Rehab Potential Good    PT Duration 12 weeks    PT  Treatment/Interventions ADLs/Self Care Home Management;Aquatic Therapy;Cryotherapy;Electrical Stimulation;Iontophoresis 59m/ml Dexamethasone;Moist Heat;Traction;Ultrasound;DME Instruction;Gait training;Stair training;Functional mobility training;Therapeutic activities;Therapeutic exercise;Balance training;Neuromuscular re-education;Patient/family education;Manual techniques;Orthotic Fit/Training;Compression bandaging;Dry needling;Energy conservation;Splinting;Taping;Vasopneumatic Device;Spinal Manipulations;Joint Manipulations;Scar mobilization;Passive range of motion  PT Next Visit Plan anticipate D/C    PT Home Exercise Plan table slides flexion, abduction, pendulums; AAROM flexion/ER in supine, wall walk flexion/abduction, scap retraction 6/27 shoulder ER, IR isometrics, sideyling ER, prone extension, horzontal abd 7/11 shoulder flexion with band between hands, scap ret with [place] ER, horizontal abd, PNF D2 with green band for all    Consulted and Agree with Plan of Care Patient             Patient will benefit from skilled therapeutic intervention in order to improve the following deficits and impairments:  Decreased range of motion, Impaired UE functional use, Increased muscle spasms, Decreased activity tolerance, Decreased knowledge of precautions, Pain, Hypomobility, Impaired flexibility, Improper body mechanics, Decreased mobility, Decreased strength  Visit Diagnosis: Left shoulder pain, unspecified chronicity  Muscle weakness (generalized)  Stiffness of left shoulder, not elsewhere classified  Other symptoms and signs involving the musculoskeletal system     Problem List Patient Active Problem List   Diagnosis Date Noted   Type 2 diabetes mellitus without complications ([place]) 69/45/0388   Patellar tendinitis of left knee 12/11/2013   Morbid obesity ([place]) 05/09/2013   Osteoarthritis of left knee 11/09/2012   Obstructive sleep apnea 09/11/2012   HTN (hypertension) 09/11/2012    Degenerative arthritis of left knee 06/03/2011   S/P lateral meniscectomy of left knee 06/03/2011   BACK PAIN 05/01/2009   DERANGEMENT MENISCUS 09/04/2007   ELBOW PAIN, BILATERAL 04/06/2007   DEGENERATIVE JOINT DISEASE, KNEE 03/13/2007   Pain in joint, lower leg 03/13/2007   11:28 AM, 12/15/20 Mearl Latin PT, DPT Physical Therapist at [place], [place], [address] Phone: [phone]   Fax:  [phone]  Name: ISAIH BULGER MRN: [id number] Date of Birth: [birth year]

## 2020-12-17 ENCOUNTER — Encounter (HOSPITAL_COMMUNITY): Payer: Self-pay | Admitting: Physical Therapy

## 2020-12-17 ENCOUNTER — Ambulatory Visit (HOSPITAL_COMMUNITY): Payer: No Typology Code available for payment source | Admitting: Physical Therapy

## 2020-12-17 ENCOUNTER — Other Ambulatory Visit: Payer: Self-pay

## 2020-12-17 DIAGNOSIS — M6281 Muscle weakness (generalized): Secondary | ICD-10-CM

## 2020-12-17 DIAGNOSIS — R29898 Other symptoms and signs involving the musculoskeletal system: Secondary | ICD-10-CM

## 2020-12-17 DIAGNOSIS — M25512 Pain in left shoulder: Secondary | ICD-10-CM

## 2020-12-17 DIAGNOSIS — M25612 Stiffness of left shoulder, not elsewhere classified: Secondary | ICD-10-CM

## 2020-12-17 NOTE — Patient Instructions (Signed)
Access Code: [email or web address] URL: [email or web address] Date: 12/17/2020 Prepared by: Mitzi Hansen Shalane Florendo  Exercises Standing Full Range Shoulder Flexion with Dumbbells - 1 x daily - 7 x weekly - 3 sets - 10 reps Shoulder Abduction with Dumbbells - Thumbs Up - 1 x daily - 7 x weekly - 3 sets - 10 reps Standing Shoulder Row with Anchored Resistance - 1 x daily - 7 x weekly - 3 sets - 10 reps Shoulder extension with resistance - Neutral - 1 x daily - 7 x weekly - 3 sets - 10 reps

## 2020-12-17 NOTE — Therapy (Signed)
[place], [place], [address] Phone: [phone]   Fax:  [phone]  Physical Therapy Treatment/Discharge Summary  Patient Details  Name: Adam Hahn MRN: [id number] Date of Birth: Jul 02, 1965 Referring Provider (PT): Justice Britain MD   Encounter Date: 12/17/2020  PHYSICAL THERAPY DISCHARGE SUMMARY  Visits from Start of Care: 15  Current functional level related to goals / functional outcomes: See below   Remaining deficits: See below   Education / Equipment: See below   Patient agrees to discharge. Patient goals were met. Patient is being discharged due to meeting the stated rehab goals.    PT End of Session - 12/17/20 1359     Visit Number 15    Number of Visits 24    Date for PT Re-Evaluation 01/05/21    Authorization Type VA commuity care (15 visits approved - will need to request more visits)    Authorization - Visit Number 15    Authorization - Number of Visits 15    PT Start Time 1402    PT Stop Time 1435    PT Time Calculation (min) 33 min    Activity Tolerance Patient tolerated treatment well    Behavior During Therapy WFL for tasks assessed/performed             Past Medical History:  Diagnosis Date   Allergic rhinitis    Arthritis    knees,  right shoulder ac joint   History of exercise stress test 10-09-2001 in epic   normal   HTN (hypertension)    Newly diagnosed diabetes ([place]) 01/13/2017   OSA on CPAP    Shoulder impingement, right    Type 2 diabetes mellitus ([place])    followed by pcp   Wears glasses     Past Surgical History:  Procedure Laterality Date   COLONOSCOPY WITH PROPOFOL N/A 08/13/2020   Procedure: COLONOSCOPY WITH PROPOFOL;  Surgeon: Harvel Quale, MD;  [facility];  Service: Gastroenterology;  Laterality: N/A;  am   KNEE SURGERY Left x4  last one 1996   LAPAROSCOPIC CHOLECYSTECTOMY  09-20-2007  '@[facility]'    POLYPECTOMY  08/13/2020    Procedure: POLYPECTOMY;  Surgeon: Harvel Quale, MD;  [facility];  Service: Gastroenterology;;   SHOULDER ARTHROSCOPY WITH ROTATOR CUFF REPAIR Right 10/17/2018   Procedure: Right shoulder arthroscopy with subacromial decompression, distal clavicle resection,  rotator cuff DEBRIEDMENT, LABRIAL DEBRIDMENT;  Surgeon: Justice Britain, MD;  Location: [facility];  Service: Orthopedics;  Laterality: Right;  168mn    There were no vitals filed for this visit.   Subjective Assessment - 12/17/20 1401     Subjective His home exercises are going alright. He feels that is lacking in strength for ADL/heavier tasks. Patient states 75-80% improvement with PT intervention.    Currently in Pain? Yes    Pain Score 4     Pain Location Shoulder    Pain Orientation Left    Pain Descriptors / Indicators Sore    Pain Type Surgical pain    Pain Onset More than a month ago    Pain Frequency Constant                [facility] PT Assessment - 12/17/20 0001       Assessment   Medical Diagnosis Impingement syndrome of L shoulder/ L RCR, SAD, DCR    Referring Provider (PT) KJustice BritainMD    Onset Date/Surgical Date 10/03/20  Next MD Visit September 12    Prior Therapy R RCR      Balance Screen   Has the patient fallen in the past 6 months No    Has the patient had a decrease in activity level because of a fear of falling?  No    Is the patient reluctant to leave their home because of a fear of falling?  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      Cognition   Overall Cognitive Status Within Functional Limits for tasks assessed      Observation/Other Assessments   Focus on Therapeutic Outcomes (FOTO)  71% function      AROM   Left Shoulder Flexion 150 Degrees    Left Shoulder ABduction 150 Degrees    Left Shoulder Internal Rotation --   T9   Left Shoulder  External Rotation --   T3     Strength   Left Shoulder Flexion 4+/5    Left Shoulder ABduction 5/5    Left Shoulder Internal Rotation 5/5    Left Shoulder External Rotation 5/5                                   PT Education - 12/17/20 1400     Education Details HEP, reassessment findings, return to PT if needed, review of HEP, how to progress strength exercises, gradual increase in activity    Person(s) Educated Patient    Methods Explanation;Handout    Comprehension Verbalized understanding              PT Short Term Goals - 11/06/20 1032       PT SHORT TERM GOAL #1   Title Patient will be independent with HEP in order to improve functional outcomes.    Baseline 11/06/20:  Reports compliance with current HEP daily    Status Achieved      PT SHORT TERM GOAL #2   Title Patient will report at least 25% improvement in symptoms for improved quality of life.    Baseline 11/06/20:  Reports improvements by 50%, continues to have some weakness, ROM  and pain.    Status Achieved               PT Long Term Goals - 12/17/20 1410       PT LONG TERM GOAL #1   Title Patient will report at least 75% improvement in symptoms for improved quality of life.    Baseline 7/20:  Reports improvements by 50%, continues to have some weakness, ROM  and pain. 8/17 75% to 80% improvement    Status Achieved      PT LONG TERM GOAL #2   Title Patient will demonstrate full L shoulder AROM in order to complete overhead tasks.    Baseline see ROM    Status Achieved      PT LONG TERM GOAL #3   Title Patient will demonstrate 5/5 L shoulder strength in order to be able to lift objects at work.    Baseline 5/5 gross LUE strength    Status Achieved                   Plan - 12/17/20 1400     Clinical Impression Statement Patient has met all short and long term goals  with ability to complete HEP and improvement in symptoms, ROM, strength, and functional mobility.  Patient stating continued symptoms and difficulty with completing tasks requiring higher level strength. Patient overall has progressed well demonstrating full ROM and good strength with minimal strength deficit currently. Reviewed HEP with patient and educated patient on how to progress strength with reps/sets/ resistance. Discussed with patient how to gradually begin increase in activity and perform heavier lifting tasks with good body mechanics. Patient educated on returning to PT if needed. Patient discharged from skilled physical therapy at this time.    Personal Factors and Comorbidities Comorbidity 3+;Time since onset of injury/illness/exacerbation;Fitness    Comorbidities Arthritis, Back pain, BMI over 30, Diabetes II, High Blood Pressure    Examination-Activity Limitations Reach Overhead;Bend;Lift;Carry;Caring for Others;Hygiene/Grooming;Dressing    Examination-Participation Restrictions Cleaning;Occupation;Meal Prep;Driving;Community Activity;Laundry;Volunteer;Shop    Rehab Potential Good    PT Duration --    PT Treatment/Interventions ADLs/Self Care Home Management;Aquatic Therapy;Cryotherapy;Electrical Stimulation;Iontophoresis 72m/ml Dexamethasone;Moist Heat;Traction;Ultrasound;DME Instruction;Gait training;Stair training;Functional mobility training;Therapeutic activities;Therapeutic exercise;Balance training;Neuromuscular re-education;Patient/family education;Manual techniques;Orthotic Fit/Training;Compression bandaging;Dry needling;Energy conservation;Splinting;Taping;Vasopneumatic Device;Spinal Manipulations;Joint Manipulations;Scar mobilization;Passive range of motion    PT Next Visit Plan n/a    PT Home Exercise Plan table slides flexion, abduction, pendulums; AAROM flexion/ER in supine, wall walk flexion/abduction, scap retraction 6/27 shoulder ER, IR isometrics, sideyling ER, prone extension, horzontal abd 7/11 shoulder flexion with band between hands, [facility]ap [facility], horizontal  abd, PNF D2 with green band for all; 8/17 flexion and abd with weights, Row, ext    Consulted and Agree with Plan of Care Patient             Patient will benefit from skilled therapeutic intervention in order to improve the following deficits and impairments:  Decreased range of motion, Impaired UE functional use, Increased muscle spasms, Decreased activity tolerance, Decreased knowledge of precautions, Pain, Hypomobility, Impaired flexibility, Improper body mechanics, Decreased mobility, Decreased strength  Visit Diagnosis: Left shoulder pain, unspecified chronicity  Muscle weakness (generalized)  Stiffness of left shoulder, not elsewhere classified  Other symptoms and signs involving the musculoskeletal system     Problem List Patient Active Problem List   Diagnosis Date Noted   Type 2 diabetes mellitus without complications ([place] 006/00/4599  Patellar tendinitis of left knee 12/11/2013   [place] 05/09/2013   Osteoarthritis of left knee 11/09/2012   Obstructive sleep apnea 09/11/2012   HTN (hypertension) 09/11/2012   Degenerative arthritis of left knee 06/03/2011   S/P lateral meniscectomy of left knee 06/03/2011   BACK PAIN 05/01/2009   DERANGEMENT MENISCUS 09/04/2007   ELBOW PAIN, BILATERAL 04/06/2007   DEGENERATIVE JOINT DISEASE, KNEE 03/13/2007   Pain in joint, lower leg 03/13/2007     2:44 PM, 12/17/20 AMearl LatinPT, DPT [place] [address]Phone: [phone]  Fax:  [phone] Name: Adam WURZERMRN: [id number] of Birth: [birth year]

## 2020-12-22 ENCOUNTER — Encounter (HOSPITAL_COMMUNITY): Payer: BC Managed Care – PPO

## 2020-12-24 ENCOUNTER — Encounter (HOSPITAL_COMMUNITY): Payer: BC Managed Care – PPO

## 2020-12-26 DIAGNOSIS — M9901 Segmental and somatic dysfunction of cervical region: Secondary | ICD-10-CM | POA: Diagnosis not present

## 2020-12-26 DIAGNOSIS — M542 Cervicalgia: Secondary | ICD-10-CM | POA: Diagnosis not present

## 2020-12-26 DIAGNOSIS — M9902 Segmental and somatic dysfunction of thoracic region: Secondary | ICD-10-CM | POA: Diagnosis not present

## 2020-12-26 DIAGNOSIS — M546 Pain in thoracic spine: Secondary | ICD-10-CM | POA: Diagnosis not present

## 2020-12-29 ENCOUNTER — Encounter (HOSPITAL_COMMUNITY): Payer: BC Managed Care – PPO | Admitting: Physical Therapy

## 2020-12-31 ENCOUNTER — Encounter (HOSPITAL_COMMUNITY): Payer: BC Managed Care – PPO | Admitting: Physical Therapy

## 2021-01-07 ENCOUNTER — Ambulatory Visit: Payer: BC Managed Care – PPO | Admitting: Internal Medicine

## 2021-01-07 ENCOUNTER — Encounter: Payer: Self-pay | Admitting: Internal Medicine

## 2021-01-07 ENCOUNTER — Other Ambulatory Visit: Payer: Self-pay

## 2021-01-07 VITALS — BP 132/90 | HR 105 | Temp 98.3°F | Resp 18 | Ht 73.0 in | Wt 291.0 lb

## 2021-01-07 DIAGNOSIS — Z7689 Persons encountering health services in other specified circumstances: Secondary | ICD-10-CM | POA: Insufficient documentation

## 2021-01-07 DIAGNOSIS — M170 Bilateral primary osteoarthritis of knee: Secondary | ICD-10-CM | POA: Insufficient documentation

## 2021-01-07 DIAGNOSIS — Z0000 Encounter for general adult medical examination without abnormal findings: Secondary | ICD-10-CM

## 2021-01-07 DIAGNOSIS — Z1159 Encounter for screening for other viral diseases: Secondary | ICD-10-CM

## 2021-01-07 DIAGNOSIS — Z0001 Encounter for general adult medical examination with abnormal findings: Secondary | ICD-10-CM

## 2021-01-07 DIAGNOSIS — E559 Vitamin D deficiency, unspecified: Secondary | ICD-10-CM | POA: Insufficient documentation

## 2021-01-07 DIAGNOSIS — G629 Polyneuropathy, unspecified: Secondary | ICD-10-CM | POA: Insufficient documentation

## 2021-01-07 DIAGNOSIS — N529 Male erectile dysfunction, unspecified: Secondary | ICD-10-CM | POA: Insufficient documentation

## 2021-01-07 DIAGNOSIS — I10 Essential (primary) hypertension: Secondary | ICD-10-CM | POA: Diagnosis not present

## 2021-01-07 DIAGNOSIS — M2350 Chronic instability of knee, unspecified knee: Secondary | ICD-10-CM

## 2021-01-07 DIAGNOSIS — J309 Allergic rhinitis, unspecified: Secondary | ICD-10-CM | POA: Insufficient documentation

## 2021-01-07 DIAGNOSIS — M5417 Radiculopathy, lumbosacral region: Secondary | ICD-10-CM | POA: Insufficient documentation

## 2021-01-07 DIAGNOSIS — E119 Type 2 diabetes mellitus without complications: Secondary | ICD-10-CM | POA: Diagnosis not present

## 2021-01-07 DIAGNOSIS — Z114 Encounter for screening for human immunodeficiency virus [HIV]: Secondary | ICD-10-CM

## 2021-01-07 DIAGNOSIS — M75112 Incomplete rotator cuff tear or rupture of left shoulder, not specified as traumatic: Secondary | ICD-10-CM | POA: Insufficient documentation

## 2021-01-07 HISTORY — DX: Chronic instability of knee, unspecified knee: M23.50

## 2021-01-07 MED ORDER — VERAPAMIL HCL ER 240 MG PO TBCR: 240.0000 mg | EXTENDED_RELEASE_TABLET | Freq: Every day | ORAL | 3 refills | Status: AC

## 2021-01-07 MED ORDER — SEMAGLUTIDE (1 MG/DOSE) 4 MG/3ML ~~LOC~~ SOPN: 1.0000 mg | PEN_INJECTOR | SUBCUTANEOUS | 2 refills | Status: DC

## 2021-01-07 NOTE — Assessment & Plan Note (Signed)
Lab Results  Component Value Date   HGBA1C 7.4 (H) 10/10/2020   On Metformin and Glipizide Started Ozempic again, advised that rash is not likely from [place] to follow diabetic diet F/u CMP and lipid panel Diabetic foot exam: Today Diabetic eye exam: Advised to follow up with Ophthalmology for diabetic eye exam

## 2021-01-07 NOTE — Progress Notes (Signed)
New Patient Office Visit  Subjective:  Patient ID: Adam Hahn, male    DOB: [birth year]  Age: 55 y.o. MRN: [id number]  CC:  Chief Complaint  Patient presents with   New Patient (Initial Visit)    New patient was seeing dr Wolfgang Phoenix has had a rash on arms he started taking ozempic and cardizem however he came off the ozempic but he has been taking varampimil instead of cardizem until he knows what is causing it . Rash is getting better     HPI Adam Hahn is a 55 year old male with PMH of HTN, DM and OA who presents for establishing care.  HTN: BP was elevated today. He was started on diltiazem instead of verapamil and he started having a rash with it.  He stopped taking diltiazem and restarted verapamil and his rash has improved since then. Patient denies headache, dizziness, chest pain, dyspnea or palpitations.  DM: He takes metformin, glipizide and used to take Ozempic.  He stopped taking Ozempic as he thought the rash could be from [place] as well although he had used it without any adverse reaction for a month.  He agrees that the rash was likely due to diltiazem.  He denies any fatigue, polyuria or polyphagia.  Denies any dysuria or hematuria.  OA of knee: Followed by orthopedic surgery.  He has had arthroscopic repair of meniscal tear.  He has had 2 doses of COVID-vaccine.  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   OSA on CPAP    S/P lateral meniscectomy of left knee 06/03/2011   Shoulder impingement, right    Type 2 diabetes mellitus ([place])    followed by pcp   Wears glasses     Past Surgical History:  Procedure Laterality Date   COLONOSCOPY WITH PROPOFOL N/A 08/13/2020   Procedure: COLONOSCOPY WITH PROPOFOL;   Surgeon: Harvel Quale, MD;  [facility];  Service: Gastroenterology;  Laterality: N/A;  am   KNEE SURGERY Left x4  last one 1996   LAPAROSCOPIC CHOLECYSTECTOMY  09-20-2007  '@[facility]'    POLYPECTOMY  08/13/2020   Procedure: POLYPECTOMY;  Surgeon: Harvel Quale, MD;  [facility];  Service: Gastroenterology;;   SHOULDER ARTHROSCOPY WITH ROTATOR CUFF REPAIR Right 10/17/2018   Procedure: Right shoulder arthroscopy with subacromial decompression, distal clavicle resection,  rotator cuff DEBRIEDMENT, LABRIAL DEBRIDMENT;  Surgeon: Justice Britain, MD;  Location: [facility];  Service: Orthopedics;  Laterality: Right;  171mn    Family History  Problem Relation Age of Onset   Hypertension Father    Asthma Mother    Allergic rhinitis Neg Hx    Angioedema Neg Hx    Atopy Neg Hx    Eczema Neg Hx    Immunodeficiency Neg Hx    Urticaria Neg Hx     Social History   Socioeconomic History   Marital status: Married    Spouse name: Not on file   Number of children: Not on file   Years of education: Not on file   Highest education level: Not on file  Occupational History   Not on file  Tobacco Use   Smoking status: Never   Smokeless tobacco: Never  Vaping Use   Vaping Use: Never used  Substance and Sexual Activity   Alcohol use: Yes    Comment: occasionally   Drug use: Never   Sexual activity: Yes  Other Topics Concern   Not on file  Social History Narrative   Not on file   Social Determinants of Health   Financial Resource Strain: Not on file  Food Insecurity: Not on file  Transportation Needs: Not on file  Physical Activity: Not on file  Stress: Not on file  Social Connections: Not on file  Intimate Partner Violence: Not on file    ROS Review of Systems  Constitutional:  Negative for chills and fever.  HENT:  Negative for congestion and sore throat.   Eyes:  Negative for pain and discharge.  Respiratory:  Negative for  cough and shortness of breath.   Cardiovascular:  Negative for chest pain and palpitations.  Gastrointestinal:  Negative for constipation, diarrhea, nausea and vomiting.  Endocrine: Negative for polydipsia and polyuria.  Genitourinary:  Negative for dysuria and hematuria.  Musculoskeletal:  Negative for neck pain and neck stiffness.  Skin:  Negative for rash.  Neurological:  Negative for dizziness, weakness, numbness and headaches.  Psychiatric/Behavioral:  Negative for agitation and behavioral problems.    Objective:   Today's Vitals: BP 132/90 (BP Location: Left Arm, Patient Position: Sitting, Cuff Size: Normal)   Pulse (!) 105   Temp 98.3 F (36.8 C) (Oral)   Resp 18   Ht '6\' 1"'  (1.854 m)   Wt 291 lb (132 kg)   SpO2 96%   BMI 38.39 kg/m   Physical Exam Vitals reviewed.  Constitutional:      General: He is not in acute distress.    Appearance: He is obese. He is not diaphoretic.  HENT:     Head: Normocephalic and atraumatic.     Nose: Nose normal.     Mouth/Throat:     Mouth: Mucous membranes are moist.  Eyes:     General: No scleral icterus.    Extraocular Movements: Extraocular movements intact.  Cardiovascular:     Rate and Rhythm: Normal rate and regular rhythm.     Pulses: Normal pulses.     Heart sounds: Normal heart sounds. No murmur heard. Pulmonary:     Breath sounds: Normal breath sounds. No wheezing or rales.  Musculoskeletal:     Cervical back: Neck supple. No tenderness.     Right lower leg: No edema.     Left lower leg: No edema.  Skin:    General: Skin is warm.     Findings: No rash.  Neurological:     General: No focal deficit present.     Mental Status: He is alert and oriented to person, place, and time.     Sensory: No sensory deficit.     Motor: No weakness.  Psychiatric:        Mood and Affect: Mood normal.        Behavior: Behavior normal.    Assessment & Plan:   Problem List Items Addressed This Visit       Encounter to establish  care - Primary   Care established History and medications reviewed with the patient       Cardiovascular and Mediastinum   HTN (hypertension)    BP Readings from Last 1 Encounters:  01/07/21 132/90  Elevated today On Verapamil 240 mg QD If persistently elevated, plan to switch to Amlodipine and add HCTZ Allergic to ACEi/ARB Counseled  for compliance with the medications Advised DASH diet and moderate exercise/walking, at least 150 mins/week       Relevant Medications   verapamil (CALAN-SR) 240 MG CR tablet     Endocrine   Type 2 diabetes mellitus without complications (HCC)    Lab Results  Component Value Date   HGBA1C 7.4 (H) 10/10/2020  On Metformin and Glipizide Started Ozempic again, advised that rash is not likely from [place] to follow diabetic diet F/u CMP and lipid panel Diabetic foot exam: Today Diabetic eye exam: Advised to follow up with Ophthalmology for diabetic eye exam       Relevant Medications   Semaglutide, 1 MG/DOSE, 4 MG/3ML SOPN   Other Relevant Orders   CMP14+EGFR   HgB A1c     Musculoskeletal and Integument   Bilateral primary osteoarthritis of knee    Followed by Orthopedic surgery Has had arthroscopic repair of meniscal tear        Other         Other Visit Diagnoses     Need for hepatitis C screening test       Relevant Orders   Hepatitis C Antibody   Encounter for screening for HIV       Relevant Orders   HIV antibody (with reflex)       Outpatient Encounter Medications as of 01/07/2021  Medication Sig   Cholecalciferol 50 MCG (2000 UT) TABS TAKE ONE TABLET BY MOUTH EVERY DAY THIS REPLACES ERGOCALCIFEROL FOR TREATMENT OF VITAMIN D DEF   glipiZIDE (GLUCOTROL) 5 MG tablet Take 1 tablet (5 mg total) by mouth 2 (two) times daily before a meal. Getting meds from [place].   metFORMIN (GLUCOPHAGE) 1000 MG tablet TAKE (1) TABLET BY MOUTH TWICE A DAY WITH MEALS (BREAKFAST AND SUPPER).   Semaglutide, 1 MG/DOSE, 4 MG/3ML SOPN  Inject 1 mg as directed once a week.   [DISCONTINUED] verapamil (CALAN-SR) 240 MG CR tablet TAKE ONE TABLET BY MOUTH DAILY  FOR BLOOD PRESSURE IN PLACE OF [place]ARTAN   verapamil (CALAN-SR) 240 MG CR tablet Take 1 tablet (240 mg total) by mouth daily.   [DISCONTINUED] cholestyramine (QUESTRAN) 4 GM/DOSE powder USE 1/2 SCOOP once DAILY AS DIRECTED. (Patient not taking: Reported on 01/07/2021)   [DISCONTINUED] diltiazem (CARDIZEM CD) 300 MG 24 hr capsule Take 1 capsule (300 mg total) by mouth daily. (Patient not taking: Reported on 01/07/2021)   [DISCONTINUED] indomethacin (INDOCIN) 25 MG capsule Take 1 capsule (25 mg total) by mouth 2 (two) times daily with a meal.   No facility-administered encounter medications on file as of 01/07/2021.    Follow-up: Return in about 3 months (around 04/08/2021) for Annual physical.   Lindell Spar, MD

## 2021-01-07 NOTE — Assessment & Plan Note (Signed)
Followed by Orthopedic surgery Has had arthroscopic repair of meniscal tear

## 2021-01-07 NOTE — Assessment & Plan Note (Signed)
BP Readings from Last 1 Encounters:  01/07/21 132/90   Elevated today On Verapamil 240 mg QD If persistently elevated, plan to switch to Amlodipine and add HCTZ Allergic to ACEi/ARB Counseled for compliance with the medications Advised DASH diet and moderate exercise/walking, at least 150 mins/week

## 2021-01-07 NOTE — Patient Instructions (Signed)
Please continue taking Verapamil.  Please restart using Ozempic as prescribed.  Please continue to follow low carb diet and perform moderate exercise/walking at least 150 mins/week.

## 2021-01-07 NOTE — Assessment & Plan Note (Signed)
Care established History and medications reviewed with the patient 

## 2021-01-08 ENCOUNTER — Ambulatory Visit: Payer: BC Managed Care – PPO | Admitting: Family Medicine

## 2021-02-09 ENCOUNTER — Telehealth: Payer: Self-pay | Admitting: Internal Medicine

## 2021-02-09 NOTE — Telephone Encounter (Signed)
Patient came by office with fax cover letter for prescription to be faxed to [facility].  Fax # [phone]

## 2021-02-24 ENCOUNTER — Telehealth: Payer: Self-pay

## 2021-02-24 NOTE — Telephone Encounter (Signed)
Patient called asking if [facility] form was completed and sent in so that his Ozempic medicine will be covered.  Patient called back # [phone].

## 2021-02-24 NOTE — Telephone Encounter (Signed)
We have not received any form for ozempic and the [place]

## 2021-02-25 ENCOUNTER — Ambulatory Visit (INDEPENDENT_AMBULATORY_CARE_PROVIDER_SITE_OTHER): Payer: BC Managed Care – PPO

## 2021-02-25 ENCOUNTER — Other Ambulatory Visit: Payer: Self-pay

## 2021-02-25 DIAGNOSIS — Z23 Encounter for immunization: Secondary | ICD-10-CM

## 2021-02-25 NOTE — Telephone Encounter (Signed)
Patient will bring another form by our office.

## 2021-02-27 ENCOUNTER — Other Ambulatory Visit: Payer: Self-pay | Admitting: Internal Medicine

## 2021-02-27 DIAGNOSIS — E119 Type 2 diabetes mellitus without complications: Secondary | ICD-10-CM

## 2021-02-27 MED ORDER — SEMAGLUTIDE (1 MG/DOSE) 4 MG/3ML ~~LOC~~ SOPN: 1.0000 mg | PEN_INJECTOR | SUBCUTANEOUS | 2 refills | Status: DC

## 2021-03-02 ENCOUNTER — Encounter: Payer: Self-pay | Admitting: *Deleted

## 2021-03-04 ENCOUNTER — Encounter: Payer: Self-pay | Admitting: Internal Medicine

## 2021-03-04 ENCOUNTER — Other Ambulatory Visit: Payer: Self-pay

## 2021-03-04 ENCOUNTER — Ambulatory Visit (INDEPENDENT_AMBULATORY_CARE_PROVIDER_SITE_OTHER): Payer: BC Managed Care – PPO | Admitting: Internal Medicine

## 2021-03-04 DIAGNOSIS — J0110 Acute frontal sinusitis, unspecified: Secondary | ICD-10-CM

## 2021-03-04 MED ORDER — AZITHROMYCIN 250 MG PO TABS
ORAL_TABLET | ORAL | 0 refills | Status: AC
Start: 2021-03-04 — End: 2021-03-09

## 2021-03-04 NOTE — Progress Notes (Signed)
Virtual Visit via Telephone Note   This visit type was conducted due to national recommendations for restrictions regarding the COVID-19 Pandemic (e.g. social distancing) in an effort to limit this patient's exposure and mitigate transmission in our community.  Due to his co-morbid illnesses, this patient is at least at moderate risk for complications without adequate follow up.  This format is felt to be most appropriate for this patient at this time.  The patient did not have access to video technology/had technical difficulties with video requiring transitioning to audio format only (telephone).  All issues noted in this document were discussed and addressed.  No physical exam could be performed with this format.   Evaluation Performed:  Follow-up visit  Date:  03/04/2021   ID:  Adam Hahn, DOB [birth year], MRN [id number]  Patient Location: Home Provider Location: Office/Clinic  Participants: Patient Location of Patient: Home Location of Provider: Telehealth Consent was obtain for visit to be over via telehealth. I verified that I am speaking with the correct person using two identifiers.  PCP:  Lindell Spar, MD   Chief Complaint: Nasal congestion and cough  History of Present Illness:    Adam Hahn is a 55 y.o. male who has a televisit for c/o nasal congestion, headache and cough for last 3-4 days. Denies any fever, chills, dyspnea or wheezing. His home COVID test was negative. He has tried Mucinex and Emergen C.  The patient does not have symptoms concerning for COVID-19 infection (fever, chills, cough, or new shortness of breath).   Past Medical, Surgical, Social History, Allergies, and Medications have been Reviewed.  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   OSA on CPAP    S/P lateral meniscectomy of left knee 06/03/2011   Shoulder impingement, right    Type 2 diabetes mellitus ([place])    followed by pcp   Wears glasses    Past Surgical History:  Procedure Laterality Date   COLONOSCOPY WITH PROPOFOL N/A 08/13/2020   Procedure: COLONOSCOPY WITH PROPOFOL;  Surgeon: Harvel Quale, MD;  [facility];  Service: Gastroenterology;  Laterality: N/A;  am   KNEE SURGERY Left x4  last one 1996   LAPAROSCOPIC CHOLECYSTECTOMY  09-20-2007  @[facility]    POLYPECTOMY  08/13/2020   Procedure: POLYPECTOMY;  Surgeon: Harvel Quale, MD;  [facility];  Service: Gastroenterology;;   SHOULDER ARTHROSCOPY WITH ROTATOR CUFF REPAIR Right 10/17/2018   Procedure: Right shoulder arthroscopy with subacromial decompression, distal clavicle resection,  rotator cuff DEBRIEDMENT, LABRIAL DEBRIDMENT;  Surgeon: Justice Britain, MD;  Location: [facility];  Service: Orthopedics;  Laterality: Right;  175min     Current Meds  Medication Sig   azithromycin (ZITHROMAX) 250 MG tablet Take 2 tablets on day 1, then 1 tablet daily on days 2 through 5   Cholecalciferol 50 MCG (2000 UT) TABS TAKE ONE TABLET BY MOUTH EVERY DAY THIS REPLACES ERGOCALCIFEROL FOR TREATMENT OF VITAMIN D DEF   glipiZIDE (GLUCOTROL) 5 MG tablet Take 1 tablet (5 mg total) by mouth 2 (two) times daily before a meal. Getting meds from [place].   metFORMIN (GLUCOPHAGE) 1000 MG tablet TAKE (1) TABLET BY MOUTH  TWICE A DAY WITH MEALS (BREAKFAST AND SUPPER).   Semaglutide, 1 MG/DOSE, 4 MG/3ML SOPN Inject 1 mg as directed once a week.   verapamil (CALAN-SR) 240 MG CR tablet Take 1 tablet (240 mg total) by mouth daily.     Allergies:   Hydrocodone, Indomethacin, Lotrel [amlodipine besy-benazepril hcl], Tomato, Lisinopril, and Valsartan   ROS:   Please see the history of present illness.     All other systems reviewed and  are negative.   Labs/Other Tests and Data Reviewed:    Recent Labs: 10/10/2020: ALT 38; BUN 16; Creatinine, Ser 1.27; Potassium 5.1; Sodium 139   Recent Lipid Panel Lab Results  Component Value Date/Time   CHOL 160 05/28/2020 09:53 AM   TRIG 136 05/28/2020 09:53 AM   HDL 42 05/28/2020 09:53 AM   CHOLHDL 3.8 05/28/2020 09:53 AM   CHOLHDL 2.7 04/17/2014 09:12 AM   LDLCALC 94 05/28/2020 09:53 AM    Wt Readings from Last 3 Encounters:  01/07/21 291 lb (132 kg)  12/11/20 296 lb (134.3 kg)  10/10/20 285 lb (129.3 kg)     ASSESSMENT & PLAN:    Acute sinusitis Started Azithromycin as he has persistent symptoms despite symptomatic treatment Gets recurrent sinusitis with seasonal change Nasal saline spray PRN Mucinex PRN  Time:   Today, I have spent 9 minutes reviewing the chart, including problem list, medications, and with the patient with telehealth technology discussing the above problems.   Medication Adjustments/Labs and Tests Ordered: Current medicines are reviewed at length with the patient today.  Concerns regarding medicines are outlined above.   Tests Ordered: No orders of the defined types were placed in this encounter.   Medication Changes: Meds ordered this encounter  Medications   azithromycin (ZITHROMAX) 250 MG tablet    Sig: Take 2 tablets on day 1, then 1 tablet daily on days 2 through 5    Dispense:  6 tablet    Refill:  0     Note: This dictation was prepared with Dragon dictation along with smaller phrase technology. Similar sounding words can be transcribed inadequately or may not be corrected upon review. Any transcriptional errors that result from this process are unintentional.      Disposition:  Follow up  Signed, Lindell Spar, MD  03/04/2021 10:07 AM     [place]

## 2021-03-27 ENCOUNTER — Encounter: Payer: Self-pay | Admitting: Family Medicine

## 2021-03-27 ENCOUNTER — Other Ambulatory Visit: Payer: Self-pay

## 2021-03-27 ENCOUNTER — Ambulatory Visit (INDEPENDENT_AMBULATORY_CARE_PROVIDER_SITE_OTHER): Payer: BC Managed Care – PPO | Admitting: Family Medicine

## 2021-03-27 VITALS — BP 133/86 | HR 88 | Resp 15 | Ht 73.0 in | Wt 290.0 lb

## 2021-03-27 DIAGNOSIS — E119 Type 2 diabetes mellitus without complications: Secondary | ICD-10-CM | POA: Diagnosis not present

## 2021-03-27 DIAGNOSIS — I10 Essential (primary) hypertension: Secondary | ICD-10-CM

## 2021-03-27 NOTE — Assessment & Plan Note (Signed)
Mr. Dunker is reminded of the importance of commitment to daily physical activity for 30 minutes or more, as able and the need to limit carbohydrate intake to 30 to 60 grams per meal to help with blood sugar control.   The need to take medication as prescribed, test blood sugar as directed, and to call between visits if there is a concern that blood sugar is uncontrolled is also discussed.   Mr. Weldon is reminded of the importance of daily foot exam, annual eye examination, and good blood sugar, blood pressure and cholesterol control. Updated lab needed at/ before next visit.   Diabetic Labs Latest Ref Rng & Units 10/10/2020 05/28/2020 04/10/2020 05/25/2019 10/17/2018  HbA1c 4.8 - 5.6 % 7.4(H) 6.5(H) 5.0 6.5(H) -  Chol 100 - 199 mg/dL - 160 - 148 -  HDL >39 mg/dL - 42 - 45 -  Calc LDL 0 - 99 mg/dL - 94 - 80 -  Triglycerides 0 - 149 mg/dL - 136 - 129 -  Creatinine 0.76 - 1.27 mg/dL 1.27 1.17 - 1.16 1.00   BP/Weight 03/27/2021 01/07/2021 12/11/2020 10/10/2020 08/13/2020 07/28/2020 7/90/3833  Systolic BP 383 291 916 606 004 599 774  Diastolic BP 86 90 90 95 63 100 79  Wt. (Lbs) 290 291 296 285 285 295 -  BMI 38.26 38.39 39.05 37.6 37.6 38.92 -   Foot/eye exam completion dates Latest Ref Rng & Units 01/07/2021 06/11/2020  Eye Exam No Retinopathy - No Retinopathy  Foot Form Completion - Done -

## 2021-03-27 NOTE — Assessment & Plan Note (Signed)
  Patient re-educated about  the importance of commitment to a  minimum of 150 minutes of exercise per week as able.  The importance of healthy food choices with portion control discussed, as well as eating regularly and within a 12 hour window most days. The need to choose "clean , green" food 50 to 75% of the time is discussed, as well as to make water the primary drink and set a goal of 64 ounces water daily.    Weight /BMI 03/27/2021 01/07/2021 12/11/2020  WEIGHT 290 lb 291 lb 296 lb  HEIGHT 6\' 1"  6\' 1"  6\' 1"   BMI 38.26 kg/m2 38.39 kg/m2 39.05 kg/m2

## 2021-03-27 NOTE — Progress Notes (Signed)
Adam Hahn     MRN: [id number]      DOB: Apr 13, 1966   HPI Adam Hahn is here for blood pressure evaluation , concerned as he has in the ast week been noting SBP over 656 with 812 diastolic. Denies headache, light headeness, chest pain or dyspnea, no new weakness or numbness  ROS Denies recent fever or chills. Denies sinus pressure, nasal congestion, ear pain or sore throat. Denies chest congestion, productive cough or wheezing.  Denies headaches, seizures, numbness, or tingling. Denies depression, anxiety or insomnia. Denies skin break down or rash.   PE  BP 133/86   Pulse 88   Resp 15   Ht 6\' 1"  (1.854 m)   Wt 290 lb (131.5 kg)   SpO2 95%   BMI 38.26 kg/m   Patient alert and oriented and in no cardiopulmonary distress.  HEENT: No facial asymmetry, EOMI,     Neck supple .  Chest: Clear to auscultation bilaterally.  CVS: S1, S2 no murmurs, no S3.Regular rate.  ABD: Soft non tender.   Ext: No edema  MS: Adequate ROM spine, shoulders, hips and knees.  Skin: Intact, no ulcerations or rash noted.  Psych: Good eye contact, normal affect. Memory intact not anxious or depressed appearing.  CNS: CN 2-12 intact, power,  normal throughout.no focal deficits noted.   Assessment & Plan  HTN (hypertension) Adequate but sub optimal control. ducated and printed info provided as to correct way tpo check BP, heis to bring BP cuff to next visit, and encouraged once daily resting at the same time, explained ha there eis natural fluctuation in BP over a 24 hr period. No medchangeat visit, encouraged low salt and weight loss   Morbid obesity  Patient re-educated about  the importance of commitment to a  minimum of 150 minutes of exercise per week as able.  The importance of healthy food choices with portion control discussed, as well as eating regularly and within a 12 hour window most days. The need to choose "clean , green" food 50 to 75% of the time is discussed, as  well as to make water the primary drink and set a goal of 64 ounces water daily.    Weight /BMI 03/27/2021 01/07/2021 12/11/2020  WEIGHT 290 lb 291 lb 296 lb  HEIGHT 6\' 1"  6\' 1"  6\' 1"   BMI 38.26 kg/m2 38.39 kg/m2 39.05 kg/m2      Type 2 diabetes mellitus without complications [facility]) Adam Hahn is reminded of the importance of commitment to daily physical activity for 30 minutes or more, as able and the need to limit carbohydrate intake to 30 to 60 grams per meal to help with blood sugar control.   The need to take medication as prescribed, test blood sugar as directed, and to call between visits if there is a concern that blood sugar is uncontrolled is also discussed.   Adam Hahn is reminded of the importance of daily foot exam, annual eye examination, and good blood sugar, blood pressure and cholesterol control. Updated lab needed at/ before next visit.   Diabetic Labs Latest Ref Rng & Units 10/10/2020 05/28/2020 04/10/2020 05/25/2019 10/17/2018  HbA1c 4.8 - 5.6 % 7.4(H) 6.5(H) 5.0 6.5(H) -  Chol 100 - 199 mg/dL - 160 - 148 -  HDL >39 mg/dL - 42 - 45 -  Calc LDL 0 - 99 mg/dL - 94 - 80 -  Triglycerides 0 - 149 mg/dL - 136 - 129 -  Creatinine 0.76 - 1.27 mg/dL  1.27 1.17 - 1.16 1.00   BP/Weight 03/27/2021 01/07/2021 12/11/2020 10/10/2020 08/13/2020 07/28/2020 6/75/9163  Systolic BP 846 659 935 701 779 390 300  Diastolic BP 86 90 90 95 63 100 79  Wt. (Lbs) 290 291 296 285 285 295 -  BMI 38.26 38.39 39.05 37.6 37.6 38.92 -   Foot/eye exam completion dates Latest Ref Rng & Units 01/07/2021 06/11/2020  Eye Exam No Retinopathy - No Retinopathy  Foot Form Completion - Done -

## 2021-03-27 NOTE — Patient Instructions (Signed)
F/U with Dr Earnie Larsson as before  Blood pressure today , tough above goal ( 130/80 or less) does not meet requirement for medication adjustment  Please bring your cuff to the next visit

## 2021-03-27 NOTE — Assessment & Plan Note (Signed)
Adequate but sub optimal control. ducated and printed info provided as to correct way tpo check BP, heis to bring BP cuff to next visit, and encouraged once daily resting at the same time, explained ha there eis natural fluctuation in BP over a 24 hr period. No medchangeat visit, encouraged low salt and weight loss

## 2021-04-01 DIAGNOSIS — E559 Vitamin D deficiency, unspecified: Secondary | ICD-10-CM | POA: Diagnosis not present

## 2021-04-01 DIAGNOSIS — Z0000 Encounter for general adult medical examination without abnormal findings: Secondary | ICD-10-CM | POA: Diagnosis not present

## 2021-04-01 DIAGNOSIS — E785 Hyperlipidemia, unspecified: Secondary | ICD-10-CM | POA: Diagnosis not present

## 2021-04-01 DIAGNOSIS — I10 Essential (primary) hypertension: Secondary | ICD-10-CM | POA: Diagnosis not present

## 2021-04-02 LAB — CBC WITH DIFFERENTIAL/PLATELET
Basophils Absolute: 0 10*3/uL (ref 0.0–0.2)
Basos: 1 %
EOS (ABSOLUTE): 0.5 10*3/uL — ABNORMAL HIGH (ref 0.0–0.4)
Eos: 7 %
Hematocrit: 47.5 % (ref 37.5–51.0)
Hemoglobin: 16 g/dL (ref 13.0–17.7)
Immature Grans (Abs): 0 10*3/uL (ref 0.0–0.1)
Immature Granulocytes: 1 %
Lymphocytes Absolute: 2.7 10*3/uL (ref 0.7–3.1)
Lymphs: 36 %
MCH: 28.8 pg (ref 26.6–33.0)
MCHC: 33.7 g/dL (ref 31.5–35.7)
MCV: 85 fL (ref 79–97)
Monocytes Absolute: 0.5 10*3/uL (ref 0.1–0.9)
Monocytes: 6 %
Neutrophils Absolute: 3.7 10*3/uL (ref 1.4–7.0)
Neutrophils: 49 %
Platelets: 284 10*3/uL (ref 150–450)
RBC: 5.56 x10E6/uL (ref 4.14–5.80)
RDW: 12.9 % (ref 11.6–15.4)
WBC: 7.3 10*3/uL (ref 3.4–10.8)

## 2021-04-02 LAB — LIPID PANEL
Chol/HDL Ratio: 3.2 ratio (ref 0.0–5.0)
Cholesterol, Total: 168 mg/dL (ref 100–199)
HDL: 53 mg/dL (ref >39)
LDL Chol Calc (NIH): 97 mg/dL (ref 0–99)
Triglycerides: 101 mg/dL (ref 0–149)
VLDL Cholesterol Cal: 18 mg/dL (ref 5–40)

## 2021-04-02 LAB — CMP14+EGFR
ALT: 47 IU/L — ABNORMAL HIGH (ref 0–44)
AST: 33 IU/L (ref 0–40)
Albumin/Globulin Ratio: 1.8 (ref 1.2–2.2)
Albumin: 4.8 g/dL (ref 3.8–4.9)
Alkaline Phosphatase: 63 IU/L (ref 44–121)
BUN/Creatinine Ratio: 14 (ref 9–20)
BUN: 16 mg/dL (ref 6–24)
Bilirubin Total: 0.4 mg/dL (ref 0.0–1.2)
CO2: 24 mmol/L (ref 20–29)
Calcium: 10 mg/dL (ref 8.7–10.2)
Chloride: 98 mmol/L (ref 96–106)
Creatinine, Ser: 1.14 mg/dL (ref 0.76–1.27)
Globulin, Total: 2.6 g/dL (ref 1.5–4.5)
Glucose: 109 mg/dL — ABNORMAL HIGH (ref 70–99)
Potassium: 4.7 mmol/L (ref 3.5–5.2)
Sodium: 141 mmol/L (ref 134–144)
Total Protein: 7.4 g/dL (ref 6.0–8.5)
eGFR: 77 mL/min/{1.73_m2} (ref >59)

## 2021-04-02 LAB — TSH: TSH: 0.603 u[IU]/mL (ref 0.450–4.500)

## 2021-04-02 LAB — HIV ANTIBODY (ROUTINE TESTING W REFLEX): HIV Screen 4th Generation wRfx: NONREACTIVE

## 2021-04-02 LAB — HEPATITIS C ANTIBODY: Hep C Virus Ab: <0.1 s/co ratio (ref 0.0–0.9)

## 2021-04-02 LAB — HEMOGLOBIN A1C
Est. average glucose Bld gHb Est-mCnc: 146 mg/dL
Hgb A1c MFr Bld: 6.7 % — ABNORMAL HIGH (ref 4.8–5.6)

## 2021-04-02 LAB — VITAMIN D 25 HYDROXY (VIT D DEFICIENCY, FRACTURES): Vit D, 25-Hydroxy: 44.9 ng/mL (ref 30.0–100.0)

## 2021-04-08 ENCOUNTER — Other Ambulatory Visit: Payer: Self-pay | Admitting: Internal Medicine

## 2021-04-08 DIAGNOSIS — E119 Type 2 diabetes mellitus without complications: Secondary | ICD-10-CM

## 2021-04-13 ENCOUNTER — Other Ambulatory Visit: Payer: Self-pay

## 2021-04-13 ENCOUNTER — Ambulatory Visit (INDEPENDENT_AMBULATORY_CARE_PROVIDER_SITE_OTHER): Payer: BC Managed Care – PPO | Admitting: Internal Medicine

## 2021-04-13 ENCOUNTER — Encounter: Payer: Self-pay | Admitting: Internal Medicine

## 2021-04-13 VITALS — BP 123/79 | HR 101 | Resp 17 | Ht 73.0 in | Wt 292.1 lb

## 2021-04-13 DIAGNOSIS — Z23 Encounter for immunization: Secondary | ICD-10-CM | POA: Diagnosis not present

## 2021-04-13 DIAGNOSIS — E119 Type 2 diabetes mellitus without complications: Secondary | ICD-10-CM

## 2021-04-13 DIAGNOSIS — Z0001 Encounter for general adult medical examination with abnormal findings: Secondary | ICD-10-CM | POA: Diagnosis not present

## 2021-04-13 DIAGNOSIS — I10 Essential (primary) hypertension: Secondary | ICD-10-CM | POA: Diagnosis not present

## 2021-04-13 NOTE — Assessment & Plan Note (Addendum)
Lab Results  Component Value Date   HGBA1C 6.7 (H) 04/01/2021   On Metformin, Ozempic and Glipizide Advised to follow diabetic diet Reviewed CMP and lipid panel Diabetic eye exam: Advised to follow up with Ophthalmology for diabetic eye exam

## 2021-04-13 NOTE — Assessment & Plan Note (Signed)

## 2021-04-13 NOTE — Assessment & Plan Note (Addendum)
BP Readings from Last 1 Encounters:  04/13/21 123/79   Well-controlled with Verapamil 240 mg QD If persistently elevated, plan to switch to Amlodipine and add HCTZ Allergic to ACEi/ARB Counseled for compliance with the medications Advised DASH diet and moderate exercise/walking, at least 150 mins/week

## 2021-04-13 NOTE — Progress Notes (Signed)
Established Patient Office Visit  Subjective:  Patient ID: CALIX HEINBAUGH, male    DOB: 1966/04/28  Age: 55 y.o. MRN: [id number]  CC:  Chief Complaint  Patient presents with   Annual Exam    HPI Pauline Trainer Dipaola is a 55 y.o. male with past medical history of HTN, DM and OA who presents for annual physical.  HTN: BP is well-controlled. Takes medications regularly. Patient denies headache, dizziness, chest pain, dyspnea or palpitations.  Type 2 DM: He takes metformin, glipizide and Ozempic. His HbA1C has improved to 6.7 now. He denies any fatigue, polyuria or polyphagia.  Denies any dysuria or hematuria.  He received PCV20 in the office today.  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   OSA on CPAP    S/P lateral meniscectomy of left knee 06/03/2011   Shoulder impingement, right    Type 2 diabetes mellitus ([place])    followed by pcp   Wears glasses     Past Surgical History:  Procedure Laterality Date   COLONOSCOPY WITH PROPOFOL N/A 08/13/2020   Procedure: COLONOSCOPY WITH PROPOFOL;  Surgeon: Harvel Quale, MD;  [facility];  Service: Gastroenterology;  Laterality: N/A;  am   KNEE SURGERY Left x4  last one [place]  09-20-2007  $RemoveBef'@AP'[id number]$    POLYPECTOMY  08/13/2020   Procedure: POLYPECTOMY;  Surgeon: Harvel Quale, MD;  [facility];  Service: Gastroenterology;;   SHOULDER ARTHROSCOPY WITH ROTATOR CUFF REPAIR Right 10/17/2018   Procedure: Right shoulder arthroscopy with subacromial decompression, distal clavicle resection,  rotator cuff DEBRIEDMENT, LABRIAL DEBRIDMENT;  Surgeon: Justice Britain, MD;  Location: [facility];  Service: Orthopedics;   Laterality: Right;  198min    Family History  Problem Relation Age of Onset   Hypertension Father    Asthma Mother    Allergic rhinitis Neg Hx    Angioedema Neg Hx    Atopy Neg Hx    Eczema Neg Hx    Immunodeficiency Neg Hx    Urticaria Neg Hx     Social History   Socioeconomic History   Marital status: Married    Spouse name: Not on file   Number of children: Not on file   Years of education: Not on file   Highest education level: Not on file  Occupational History   Not on file  Tobacco Use   Smoking status: Never   Smokeless tobacco: Never  Vaping Use   Vaping Use: Never used  Substance and Sexual Activity   Alcohol use: Yes    Comment: occasionally   Drug use: Never   Sexual activity: Yes  Other Topics Concern   Not on file  Social History Narrative   Not on file   Social Determinants of Health   Financial Resource Strain: Not on file  Food Insecurity: Not on file  Transportation Needs: Not on file  Physical Activity: Not on file  Stress: Not on file  Social Connections: Not on file  Intimate Partner Violence: Not on file    Outpatient Medications Prior to Visit  Medication Sig Dispense Refill   Cholecalciferol 50 MCG (2000 UT) TABS TAKE ONE TABLET BY MOUTH  EVERY DAY THIS REPLACES ERGOCALCIFEROL FOR TREATMENT OF VITAMIN D DEF     glipiZIDE (GLUCOTROL) 5 MG tablet Take 1 tablet (5 mg total) by mouth 2 (two) times daily before a meal. Getting meds from [place]. 60 tablet 0   metFORMIN (GLUCOPHAGE) 1000 MG tablet TAKE (1) TABLET BY MOUTH TWICE A DAY WITH MEALS (BREAKFAST AND SUPPER). 60 tablet 0   OZEMPIC, 1 MG/DOSE, 4 MG/3ML SOPN INJECT 1MG SUBCUTANEOUSLY ONCE PER WEEKAS DIRECTED. 3 mL 0   rosuvastatin (CRESTOR) 10 MG tablet Take 10 mg by mouth daily.     verapamil (CALAN-SR) 240 MG CR tablet Take 1 tablet (240 mg total) by mouth daily. 30 tablet 3   No facility-administered medications prior to visit.    Allergies  Allergen Reactions   Hydrocodone Hives    Indomethacin Other (See Comments)   Lotrel [Amlodipine Besy-Benazepril Hcl] Other (See Comments)    lethargic   Tomato Hives   Lisinopril Rash   Valsartan Rash    ROS Review of Systems  Constitutional:  Negative for chills and fever.  HENT:  Negative for congestion and sore throat.   Eyes:  Negative for pain and discharge.  Respiratory:  Negative for cough and shortness of breath.   Cardiovascular:  Negative for chest pain and palpitations.  Gastrointestinal:  Negative for constipation, diarrhea, nausea and vomiting.  Endocrine: Negative for polydipsia and polyuria.  Genitourinary:  Negative for dysuria and hematuria.  Musculoskeletal:  Positive for arthralgias. Negative for neck pain and neck stiffness.  Skin:  Negative for rash.  Neurological:  Negative for dizziness, weakness, numbness and headaches.  Psychiatric/Behavioral:  Negative for agitation and behavioral problems.      Objective:    Physical Exam Vitals reviewed.  Constitutional:      General: He is not in acute distress.    Appearance: He is obese. He is not diaphoretic.  HENT:     Head: Normocephalic and atraumatic.     Nose: Nose normal.     Mouth/Throat:     Mouth: Mucous membranes are moist.  Eyes:     General: No scleral icterus.    Extraocular Movements: Extraocular movements intact.  Cardiovascular:     Rate and Rhythm: Normal rate and regular rhythm.     Pulses: Normal pulses.     Heart sounds: Normal heart sounds. No murmur heard. Pulmonary:     Breath sounds: Normal breath sounds. No wheezing or rales.  Abdominal:     Palpations: Abdomen is soft.     Tenderness: There is no abdominal tenderness.  Musculoskeletal:        General: Swelling (B/l knee, mild) present.     Cervical back: Neck supple. No tenderness.     Right lower leg: No edema.     Left lower leg: No edema.  Skin:    General: Skin is warm.     Findings: No rash.  Neurological:     General: No focal deficit present.      Mental Status: He is alert and oriented to person, place, and time.     Cranial Nerves: No cranial nerve deficit.     Sensory: No sensory deficit.     Motor: No weakness.  Psychiatric:        Mood and Affect: Mood normal.        Behavior: Behavior normal.    BP 123/79   Pulse (!) 101   Resp 17   Ht _0  (1.854 m)   Wt 292  lb 1.9 oz (132.5 kg)   SpO2 97%   BMI 38.54 kg/m  Wt Readings from Last 3 Encounters:  04/13/21 292 lb 1.9 oz (132.5 kg)  03/27/21 290 lb (131.5 kg)  01/07/21 291 lb (132 kg)    Lab Results  Component Value Date   TSH 0.603 04/01/2021   Lab Results  Component Value Date   WBC 7.3 04/01/2021   HGB 16.0 04/01/2021   HCT 47.5 04/01/2021   MCV 85 04/01/2021   PLT 284 04/01/2021   Lab Results  Component Value Date   NA 141 04/01/2021   K 4.7 04/01/2021   CO2 24 04/01/2021   GLUCOSE 109 (H) 04/01/2021   BUN 16 04/01/2021   CREATININE 1.14 04/01/2021   BILITOT 0.4 04/01/2021   ALKPHOS 63 04/01/2021   AST 33 04/01/2021   ALT 47 (H) 04/01/2021   PROT 7.4 04/01/2021   ALBUMIN 4.8 04/01/2021   CALCIUM 10.0 04/01/2021   EGFR 77 04/01/2021   Lab Results  Component Value Date   CHOL 168 04/01/2021   Lab Results  Component Value Date   HDL 53 04/01/2021   Lab Results  Component Value Date   LDLCALC 97 04/01/2021   Lab Results  Component Value Date   TRIG 101 04/01/2021   Lab Results  Component Value Date   CHOLHDL 3.2 04/01/2021   Lab Results  Component Value Date   HGBA1C 6.7 (H) 04/01/2021      Assessment & Plan:   Encounter for general adult medical examination with abnormal findings Physical exam as documented. Counseling done  re healthy lifestyle involving commitment to 150 minutes exercise per week, heart healthy diet, and attaining healthy weight.The importance of adequate sleep also discussed. Changes in health habits are decided on by the patient with goals and time frames  set for achieving them. Immunization and  cancer screening needs are specifically addressed at this visit.  Type 2 diabetes mellitus without complications (HCC) Lab Results  Component Value Date   HGBA1C 6.7 (H) 04/01/2021   On Metformin, Ozempic and Glipizide Advised to follow diabetic diet Reviewed CMP and lipid panel Diabetic eye exam: Advised to follow up with Ophthalmology for diabetic eye exam  HTN (hypertension) BP Readings from Last 1 Encounters:  04/13/21 123/79   Well-controlled with Verapamil 240 mg QD If persistently elevated, plan to switch to Amlodipine and add HCTZ Allergic to ACEi/ARB Counseled for compliance with the medications Advised DASH diet and moderate exercise/walking, at least 150 mins/week   No orders of the defined types were placed in this encounter.   Follow-up: Return in about 4 months (around 08/12/2021) for DM and HTN.    Lindell Spar, MD

## 2021-04-13 NOTE — Patient Instructions (Signed)
Please continue taking medications as prescribed.  Please follow low carb and low salt diet and ambulate as tolerated.

## 2021-05-05 ENCOUNTER — Ambulatory Visit: Payer: Self-pay

## 2021-05-05 DIAGNOSIS — M25571 Pain in right ankle and joints of right foot: Secondary | ICD-10-CM | POA: Diagnosis not present

## 2021-05-07 ENCOUNTER — Ambulatory Visit: Payer: BC Managed Care – PPO | Admitting: Internal Medicine

## 2021-05-14 ENCOUNTER — Other Ambulatory Visit: Payer: Self-pay | Admitting: Internal Medicine

## 2021-05-14 DIAGNOSIS — E119 Type 2 diabetes mellitus without complications: Secondary | ICD-10-CM

## 2021-06-09 ENCOUNTER — Ambulatory Visit: Payer: BC Managed Care – PPO | Admitting: Internal Medicine

## 2021-06-15 ENCOUNTER — Ambulatory Visit (INDEPENDENT_AMBULATORY_CARE_PROVIDER_SITE_OTHER): Payer: BC Managed Care – PPO | Admitting: Internal Medicine

## 2021-06-15 ENCOUNTER — Encounter: Payer: Self-pay | Admitting: Internal Medicine

## 2021-06-15 ENCOUNTER — Other Ambulatory Visit: Payer: Self-pay

## 2021-06-15 VITALS — BP 122/84 | HR 93 | Resp 18 | Ht 73.0 in | Wt 293.0 lb

## 2021-06-15 DIAGNOSIS — E119 Type 2 diabetes mellitus without complications: Secondary | ICD-10-CM

## 2021-06-15 DIAGNOSIS — E785 Hyperlipidemia, unspecified: Secondary | ICD-10-CM | POA: Insufficient documentation

## 2021-06-15 DIAGNOSIS — M6740 Ganglion, unspecified site: Secondary | ICD-10-CM | POA: Diagnosis not present

## 2021-06-15 MED ORDER — OZEMPIC (1 MG/DOSE) 4 MG/3ML ~~LOC~~ SOPN: PEN_INJECTOR | SUBCUTANEOUS | 3 refills | Status: DC

## 2021-06-19 DIAGNOSIS — M546 Pain in thoracic spine: Secondary | ICD-10-CM | POA: Diagnosis not present

## 2021-06-19 DIAGNOSIS — M9901 Segmental and somatic dysfunction of cervical region: Secondary | ICD-10-CM | POA: Diagnosis not present

## 2021-06-19 DIAGNOSIS — M9902 Segmental and somatic dysfunction of thoracic region: Secondary | ICD-10-CM | POA: Diagnosis not present

## 2021-06-19 DIAGNOSIS — M6740 Ganglion, unspecified site: Secondary | ICD-10-CM | POA: Insufficient documentation

## 2021-06-19 DIAGNOSIS — M542 Cervicalgia: Secondary | ICD-10-CM | POA: Diagnosis not present

## 2021-06-19 NOTE — Assessment & Plan Note (Signed)
Lab Results  Component Value Date   HGBA1C 6.7 (H) 04/01/2021   On Metformin, Ozempic and Glipizide Advised to follow diabetic diet Reviewed CMP and lipid panel Diabetic eye exam: Advised to follow up with Ophthalmology for diabetic eye exam

## 2021-06-19 NOTE — Assessment & Plan Note (Addendum)
Mass over left palm likely a ganglion cyst Referred to orthopedic surgeon as he has pain of left hand

## 2021-06-19 NOTE — Progress Notes (Signed)
Acute Office Visit  Subjective:    Patient ID: Adam Hahn, male    DOB: 16-Apr-1966, 56 y.o.   MRN: [id number]  Chief Complaint  Patient presents with   Hand Pain    Left palm has knot on it has been aching this has been going on for about 8 months     HPI Patient is in today for complaint of left hand pain with a knot, which has been present for the last 8 months.  He denies any recent injury.  Denies any numbness or tingling of the hand.  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   OSA on CPAP    S/P lateral meniscectomy of left knee 06/03/2011   Shoulder impingement, right    Type 2 diabetes mellitus ([place])    followed by pcp   Wears glasses     Past Surgical History:  Procedure Laterality Date   COLONOSCOPY WITH PROPOFOL N/A 08/13/2020   Procedure: COLONOSCOPY WITH PROPOFOL;  Surgeon: Harvel Quale, MD;  [facility];  Service: Gastroenterology;  Laterality: N/A;  am   KNEE SURGERY Left x4  last one 1996   LAPAROSCOPIC CHOLECYSTECTOMY  09-20-2007  @[facility]    POLYPECTOMY  08/13/2020   Procedure: POLYPECTOMY;  Surgeon: Harvel Quale, MD;  [facility];  Service: Gastroenterology;;   SHOULDER ARTHROSCOPY WITH ROTATOR CUFF REPAIR Right 10/17/2018   Procedure: Right shoulder arthroscopy with subacromial decompression, distal clavicle resection,  rotator cuff DEBRIEDMENT, LABRIAL DEBRIDMENT;  Surgeon: Justice Britain, MD;  Location: [facility];  Service: Orthopedics;  Laterality: Right;  137min    Family History  Problem Relation Age of Onset   Hypertension Father    Asthma Mother    Allergic rhinitis Neg Hx    Angioedema Neg Hx    Atopy Neg Hx    Eczema Neg Hx     Immunodeficiency Neg Hx    Urticaria Neg Hx     Social History   Socioeconomic History   Marital status: Married    Spouse name: Not on file   Number of children: Not on file   Years of education: Not on file   Highest education level: Not on file  Occupational History   Not on file  Tobacco Use   Smoking status: Never   Smokeless tobacco: Never  Vaping Use   Vaping Use: Never used  Substance and Sexual Activity   Alcohol use: Yes    Comment: occasionally   Drug use: Never   Sexual activity: Yes  Other Topics Concern   Not on file  Social History Narrative   Not on file   Social Determinants of Health   Financial Resource Strain: Not on file  Food Insecurity: Not on file  Transportation Needs: Not on file  Physical Activity: Not on file  Stress: Not on file  Social Connections: Not on file  Intimate Partner Violence: Not on file    Outpatient Medications Prior to Visit  Medication Sig Dispense Refill   Cholecalciferol 50 MCG (2000 UT) TABS TAKE ONE TABLET BY MOUTH EVERY DAY THIS REPLACES ERGOCALCIFEROL FOR TREATMENT OF VITAMIN D DEF     glipiZIDE (GLUCOTROL) 5 MG  tablet Take 1 tablet (5 mg total) by mouth 2 (two) times daily before a meal. Getting meds from [place]. 60 tablet 0   metFORMIN (GLUCOPHAGE) 1000 MG tablet TAKE (1) TABLET BY MOUTH TWICE A DAY WITH MEALS (BREAKFAST AND SUPPER). 60 tablet 0   rosuvastatin (CRESTOR) 10 MG tablet Take 10 mg by mouth daily.     verapamil (CALAN-SR) 240 MG CR tablet Take 1 tablet (240 mg total) by mouth daily. 30 tablet 3   OZEMPIC, 1 MG/DOSE, 4 MG/3ML SOPN INJECT 1MG  SUBCUTANEOUSLY ONCE PER WEEK AS DIRECTED. 3 mL 0   No facility-administered medications prior to visit.    Allergies  Allergen Reactions   Hydrocodone Hives   Indomethacin Other (See Comments)   Lotrel [Amlodipine Besy-Benazepril Hcl] Other (See Comments)    lethargic   Tomato Hives   Lisinopril Rash   Valsartan Rash    Review of Systems  Constitutional:   Negative for chills and fever.  HENT:  Negative for congestion and sore throat.   Eyes:  Negative for pain and discharge.  Respiratory:  Negative for cough and shortness of breath.   Cardiovascular:  Negative for chest pain and palpitations.  Gastrointestinal:  Negative for diarrhea, nausea and vomiting.  Endocrine: Negative for polydipsia and polyuria.  Genitourinary:  Negative for dysuria and hematuria.  Musculoskeletal:  Positive for arthralgias. Negative for neck pain and neck stiffness.  Skin:  Negative for rash.  Neurological:  Negative for dizziness, weakness, numbness and headaches.  Psychiatric/Behavioral:  Negative for agitation and behavioral problems.       Objective:    Physical Exam Vitals reviewed.  Constitutional:      General: He is not in acute distress.    Appearance: He is obese. He is not diaphoretic.  HENT:     Head: Normocephalic and atraumatic.     Nose: Nose normal.     Mouth/Throat:     Mouth: Mucous membranes are moist.  Eyes:     General: No scleral icterus.    Extraocular Movements: Extraocular movements intact.  Cardiovascular:     Rate and Rhythm: Normal rate and regular rhythm.     Pulses: Normal pulses.     Heart sounds: Normal heart sounds. No murmur heard. Pulmonary:     Breath sounds: Normal breath sounds. No wheezing or rales.  Abdominal:     Palpations: Abdomen is soft.     Tenderness: There is no abdominal tenderness.  Musculoskeletal:        General: Swelling (B/l knee, mild) present.     Cervical back: Neck supple. No tenderness.     Right lower leg: No edema.     Left lower leg: No edema.     Comments: Cystic structure noted over left hand  Skin:    General: Skin is warm.     Findings: No rash.  Neurological:     General: No focal deficit present.     Mental Status: He is alert and oriented to person, place, and time.     Cranial Nerves: No cranial nerve deficit.     Sensory: No sensory deficit.     Motor: No weakness.   Psychiatric:        Mood and Affect: Mood normal.        Behavior: Behavior normal.    BP 122/84 (BP Location: Left Arm, Patient Position: Sitting, Cuff Size: Normal)    Pulse 93    Resp 18    Ht 6\' 1"  (1.854 m)  Wt 293 lb 0.6 oz (132.9 kg)    SpO2 97%    BMI 38.66 kg/m  Wt Readings from Last 3 Encounters:  06/15/21 293 lb 0.6 oz (132.9 kg)  04/13/21 292 lb 1.9 oz (132.5 kg)  03/27/21 290 lb (131.5 kg)        Assessment & Plan:   Problem List Items Addressed This Visit       Endocrine   Type 2 diabetes mellitus without complications ([place])    Lab Results  Component Value Date   HGBA1C 6.7 (H) 04/01/2021  On Metformin, Ozempic and Glipizide Advised to follow diabetic diet Reviewed CMP and lipid panel Diabetic eye exam: Advised to follow up with Ophthalmology for diabetic eye exam      Relevant Medications   Semaglutide, 1 MG/DOSE, (OZEMPIC, 1 MG/DOSE,) 4 MG/3ML SOPN     Other   Ganglion cyst - Primary    Mass over left palm likely a ganglion cyst Referred to orthopedic surgeon as he has pain of left hand      Relevant Orders   Ambulatory referral to Orthopedic Surgery     Meds ordered this encounter  Medications   Semaglutide, 1 MG/DOSE, (OZEMPIC, 1 MG/DOSE,) 4 MG/3ML SOPN    Sig: INJECT 1MG  SUBCUTANEOUSLY ONCE PER WEEK AS DIRECTED.    Dispense:  3 mL    Refill:  3     Vitoria Conyer Keith Rake, MD

## 2021-06-24 DIAGNOSIS — H4720 Unspecified optic atrophy: Secondary | ICD-10-CM | POA: Diagnosis not present

## 2021-06-24 DIAGNOSIS — H47012 Ischemic optic neuropathy, left eye: Secondary | ICD-10-CM | POA: Diagnosis not present

## 2021-06-24 DIAGNOSIS — E119 Type 2 diabetes mellitus without complications: Secondary | ICD-10-CM | POA: Diagnosis not present

## 2021-06-24 DIAGNOSIS — H2513 Age-related nuclear cataract, bilateral: Secondary | ICD-10-CM | POA: Diagnosis not present

## 2021-06-24 LAB — HM DIABETES EYE EXAM

## 2021-07-09 ENCOUNTER — Ambulatory Visit: Payer: BC Managed Care – PPO | Admitting: Orthopedic Surgery

## 2021-07-13 ENCOUNTER — Ambulatory Visit
Admission: RE | Admit: 2021-07-13 | Discharge: 2021-07-13 | Disposition: A | Discharge status: Home / Self Care | Payer: BC Managed Care – PPO | Source: Ambulatory Visit | Attending: Urgent Care | Admitting: Urgent Care

## 2021-07-13 ENCOUNTER — Ambulatory Visit: Payer: BC Managed Care – PPO | Admitting: Orthopedic Surgery

## 2021-07-13 ENCOUNTER — Other Ambulatory Visit: Payer: Self-pay

## 2021-07-13 ENCOUNTER — Encounter: Payer: Self-pay | Admitting: Orthopedic Surgery

## 2021-07-13 VITALS — BP 136/101 | HR 93 | Ht 73.0 in | Wt 284.0 lb

## 2021-07-13 VITALS — BP 130/90 | HR 102 | Temp 99.0°F | Resp 18

## 2021-07-13 DIAGNOSIS — R2232 Localized swelling, mass and lump, left upper limb: Secondary | ICD-10-CM

## 2021-07-13 DIAGNOSIS — J069 Acute upper respiratory infection, unspecified: Secondary | ICD-10-CM

## 2021-07-13 DIAGNOSIS — E119 Type 2 diabetes mellitus without complications: Secondary | ICD-10-CM

## 2021-07-13 DIAGNOSIS — R52 Pain, unspecified: Secondary | ICD-10-CM | POA: Diagnosis not present

## 2021-07-13 DIAGNOSIS — R0981 Nasal congestion: Secondary | ICD-10-CM

## 2021-07-13 DIAGNOSIS — I10 Essential (primary) hypertension: Secondary | ICD-10-CM

## 2021-07-13 MED ORDER — BENZONATATE 100 MG PO CAPS: 100.0000 mg | ORAL_CAPSULE | Freq: Three times a day (TID) | ORAL | 0 refills | Status: DC | PRN

## 2021-07-13 MED ORDER — CETIRIZINE HCL 10 MG PO TABS: 10.0000 mg | ORAL_TABLET | Freq: Every day | ORAL | 0 refills | Status: DC

## 2021-07-13 MED ORDER — PSEUDOEPHEDRINE HCL 60 MG PO TABS: 60.0000 mg | ORAL_TABLET | Freq: Three times a day (TID) | ORAL | 0 refills | Status: DC | PRN

## 2021-07-13 MED ORDER — PROMETHAZINE-DM 6.25-15 MG/5ML PO SYRP: 5.0000 mL | ORAL_SOLUTION | Freq: Every evening | ORAL | 0 refills | Status: DC | PRN

## 2021-07-13 NOTE — Discharge Instructions (Signed)
We will notify you of your test results as they arrive and may take between 48-72 hours.  I encourage you to sign up for MyChart if you have not already done so as this can be the easiest way for [place] to communicate results to you online or through a phone app.  Generally, we only contact you if it is a positive test result.  In the meantime, if you develop worsening symptoms including fever, chest pain, shortness of breath despite our current treatment plan then please report to the emergency room as this may be a sign of worsening status from possible viral infection. ? ?Otherwise, we will manage this as a viral syndrome. For sore throat or cough try using a honey-based tea. Use 3 teaspoons of honey with juice squeezed from half lemon. Place shaved pieces of ginger into 1/2-1 cup of water and warm over stove top. Then mix the ingredients and repeat every 4 hours as needed. Please take Tylenol '500mg'$ -'650mg'$  every 6 hours for aches and pains, fevers. Hydrate very well with at least 2 liters of water. Eat light meals such as soups to replenish electrolytes and soft fruits, veggies. Start an antihistamine like Zyrtec for postnasal drainage, sinus congestion.  You can take this together with pseudoephedrine (Sudafed) at a dose of 60 mg 2-3 times a day as needed for the same kind of congestion.  Use the cough medications as needed.  ?

## 2021-07-13 NOTE — ED Triage Notes (Signed)
Pt reports chills, body aches and nasal congestion x 1 day. States he had abdominal pain yesterday at work, and was sent home. Denies abdominal pain at this moment.  ? ?Pt had negative home COVID test.  ?

## 2021-07-13 NOTE — ED Provider Notes (Signed)
?[facility] ? ? ?MRN: [id number] DOB: [birth year] ? ?Subjective:  ? ?Adam Hahn is a 56 y.o. male presenting for 1 day history of acute onset malaise, body aches, sinus congestion, chills, subjective fever.  Highest temperature was 99.3 ?F at home.  No chest pain, shortness of breath, wheezing, throat pain, ear pain, coughing.  Patient states that normally around this time year he gets sick and regularly gets azithromycin.  He is not opposed to COVID test but he did 1 at home and was negative.  Risk factors include type II diabetes treated without insulin, has an essential hypertension and is obese. ? ?No current facility-administered medications for this encounter. ? ?Current Outpatient Medications:  ?  Cholecalciferol 50 MCG (2000 UT) TABS, TAKE ONE TABLET BY MOUTH EVERY DAY THIS REPLACES ERGOCALCIFEROL FOR TREATMENT OF VITAMIN D DEF, Disp: , Rfl:  ?  glipiZIDE (GLUCOTROL) 5 MG tablet, Take 1 tablet (5 mg total) by mouth 2 (two) times daily before a meal. Getting meds from [place]., Disp: 60 tablet, Rfl: 0 ?  metFORMIN (GLUCOPHAGE) 1000 MG tablet, TAKE (1) TABLET BY MOUTH TWICE A DAY WITH MEALS (BREAKFAST AND SUPPER)., Disp: 60 tablet, Rfl: 0 ?  rosuvastatin (CRESTOR) 10 MG tablet, Take 10 mg by mouth daily., Disp: , Rfl:  ?  Semaglutide, 1 MG/DOSE, (OZEMPIC, 1 MG/DOSE,) 4 MG/3ML SOPN, INJECT '1MG'$  SUBCUTANEOUSLY ONCE PER WEEK AS DIRECTED., Disp: 3 mL, Rfl: 3 ?  verapamil (CALAN-SR) 240 MG CR tablet, Take 1 tablet (240 mg total) by mouth daily., Disp: 30 tablet, Rfl: 3  ? ?Allergies  ?Allergen Reactions  ? Indomethacin Other (See Comments)  ? Lotrel [Amlodipine Besy-Benazepril Hcl] Other (See Comments)  ?  lethargic  ? Tomato Hives  ? Ace Inhibitors Rash  ? Hydrocodone Hives and Rash  ? Lisinopril Rash  ? Losartan Potassium Rash  ? Valsartan Rash  ? ? ?Past Medical History:  ?Diagnosis Date  ? Allergic rhinitis   ? Arthritis   ? knees,  right shoulder ac joint  ? History of exercise stress test  10-09-2001 in epic  ? normal  ? HTN (hypertension)   ? Newly diagnosed diabetes ([place]) 01/13/2017  ? 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 OSA on CPAP   ? S/P lateral meniscectomy of left knee 06/03/2011  ? Shoulder impingement, right   ? Type 2 diabetes mellitus ([place])   ? followed by pcp  ? Wears glasses   ?  ? ?Past Surgical History:  ?Procedure Laterality Date  ? COLONOSCOPY WITH PROPOFOL N/A 08/13/2020  ? Procedure: COLONOSCOPY WITH PROPOFOL;  Surgeon: Harvel Quale, MD;  [facility];  Service: Gastroenterology;  Laterality: N/A;  am  ? KNEE SURGERY Left x4  last one 1996  ? LAPAROSCOPIC CHOLECYSTECTOMY  09-20-2007  '@[facility]'$   ? POLYPECTOMY  08/13/2020  ? Procedure: POLYPECTOMY;  Surgeon: Harvel Quale, MD;  [facility];  Service: Gastroenterology;;  ? SHOULDER ARTHROSCOPY WITH ROTATOR CUFF REPAIR Right 10/17/2018  ? Procedure: Right shoulder arthroscopy with subacromial decompression, distal clavicle resection,  rotator cuff DEBRIEDMENT, LABRIAL DEBRIDMENT;  Surgeon: Justice Britain, MD;  Location: [place];  Service: Orthopedics;  Laterality: Right;  176mn  ? ? ?Family History  ?Problem Relation Age of Onset  ? Hypertension Father   ? Asthma Mother   ? Allergic rhinitis Neg Hx   ? Angioedema Neg Hx   ? Atopy Neg Hx   ? Eczema Neg Hx   ?  Immunodeficiency Neg Hx   ? Urticaria Neg Hx   ? ? ?Social History  ? ?Tobacco Use  ? Smoking status: Never  ? Smokeless tobacco: Never  ?Vaping Use  ? Vaping Use: Never used  ?Substance Use Topics  ? Alcohol use: Yes  ?  Comment: occasionally  ? Drug use: Never  ? ? ?ROS ? ? ?Objective:  ? ?Vitals: ?BP 130/90 (BP Location: Right Arm)   Pulse (!) 102   Temp 99 ?F (37.2 ?C) (Oral)   Resp 18   SpO2 96%  ? ?Pulse recheck 98bpm.  ? ?Physical Exam ?Constitutional:   ?   General: He is not in acute distress. ?   Appearance: Normal appearance. He is well-developed and normal weight. He is not ill-appearing,  toxic-appearing or diaphoretic.  ?HENT:  ?   Head: Normocephalic and atraumatic.  ?   Right Ear: Tympanic membrane, ear canal and external ear normal. There is no impacted cerumen.  ?   Left Ear: Tympanic membrane, ear canal and external ear normal. There is no impacted cerumen.  ?   Nose: Nose normal. No congestion or rhinorrhea.  ?   Mouth/Throat:  ?   Mouth: Mucous membranes are moist.  ?   Pharynx: No oropharyngeal exudate or posterior oropharyngeal erythema.  ?Eyes:  ?   General: No scleral icterus.    ?   Right eye: No discharge.     ?   Left eye: No discharge.  ?   Extraocular Movements: Extraocular movements intact.  ?   Conjunctiva/sclera: Conjunctivae normal.  ?Cardiovascular:  ?   Rate and Rhythm: Normal rate and regular rhythm.  ?   Heart sounds: Normal heart sounds. No murmur heard. ?  No friction rub. No gallop.  ?Pulmonary:  ?   Effort: Pulmonary effort is normal. No respiratory distress.  ?   Breath sounds: Normal breath sounds. No stridor. No wheezing, rhonchi or rales.  ?Musculoskeletal:  ?   Cervical back: Normal range of motion and neck supple. No rigidity. No muscular tenderness.  ?Neurological:  ?   General: No focal deficit present.  ?   Mental Status: He is alert and oriented to person, place, and time.  ?Psychiatric:     ?   Mood and Affect: Mood normal.     ?   Behavior: Behavior normal.     ?   Thought Content: Thought content normal.     ?   Judgment: Judgment normal.  ? ? ?Assessment and Plan :  ? ?PDMP not reviewed this encounter. ? ?1. Viral upper respiratory infection   ?2. Nasal congestion   ?3. Body aches   ?4. Type 2 diabetes mellitus treated without insulin ([place])   ?5. Essential hypertension   ? ?Deferred imaging given clear cardiopulmonary exam, hemodynamically stable vital signs. COVID and flu test pending.  We will otherwise manage for viral upper respiratory infection.  Physical exam findings reassuring and vital signs stable for discharge. Advised supportive care, offered  symptomatic relief. Counseled patient on potential for adverse effects with medications prescribed/recommended today, ER and return-to-clinic precautions discussed, patient verbalized understanding.   ? ?  ?Jaynee Eagles, PA-C ?07/13/21 1342 ? ?

## 2021-07-13 NOTE — Progress Notes (Signed)
Chief Complaint  ?Patient presents with  ? Ganglion Cyst  ?  Left hand-palm  Referred by Dr. Posey Pronto  ? ? ?56 year old male comes in with a small nodule on the volar aspect of his left hand its been there for several months does not cause any pain or functional discomfort ? ?It is a small 2 to 3 mm nodule is on the volar aspect of the hand its in the palm it does not move with the flexor tendon ? ?I am not sure what it is ? ?I am going to take some pictures and send it to the hand specialist to see if he can give me an idea whether this is just a wart or a callus ? ?The patient says he is not doing anything that would cause a callus ?

## 2021-07-15 LAB — COVID-19, FLU A+B NAA
Influenza A, NAA: NOT DETECTED
Influenza B, NAA: NOT DETECTED
SARS-CoV-2, NAA: DETECTED — AB

## 2021-07-16 ENCOUNTER — Telehealth (HOSPITAL_COMMUNITY): Payer: Self-pay | Admitting: Emergency Medicine

## 2021-07-16 NOTE — Telephone Encounter (Signed)
Called to check up on patient after positive COVID.  Patient c/o on phone of pain "around my heart" and "on my sides" and later in the conversation also added he is winded, sometimes just sitting there.  This RN recommended ED evaluation for chest pain and SOB in the context of COVID, patient verbalized understanding and states he will talk to his wife.   ?

## 2021-08-11 ENCOUNTER — Encounter: Payer: Self-pay | Admitting: Internal Medicine

## 2021-08-11 ENCOUNTER — Ambulatory Visit: Payer: BC Managed Care – PPO | Admitting: Internal Medicine

## 2021-08-11 ENCOUNTER — Encounter: Payer: Self-pay | Admitting: *Deleted

## 2021-08-11 VITALS — BP 136/88 | HR 68 | Resp 16 | Ht 73.0 in | Wt 286.4 lb

## 2021-08-11 DIAGNOSIS — G4733 Obstructive sleep apnea (adult) (pediatric): Secondary | ICD-10-CM

## 2021-08-11 DIAGNOSIS — E119 Type 2 diabetes mellitus without complications: Secondary | ICD-10-CM | POA: Diagnosis not present

## 2021-08-11 DIAGNOSIS — E1169 Type 2 diabetes mellitus with other specified complication: Secondary | ICD-10-CM | POA: Diagnosis not present

## 2021-08-11 DIAGNOSIS — I10 Essential (primary) hypertension: Secondary | ICD-10-CM

## 2021-08-11 LAB — POCT GLYCOSYLATED HEMOGLOBIN (HGB A1C)
HbA1c, POC (controlled diabetic range): 6.3 % (ref 0.0–7.0)
HbA1c, POC (prediabetic range): 6.3 % (ref 5.7–6.4)

## 2021-08-11 MED ORDER — SEMAGLUTIDE (2 MG/DOSE) 8 MG/3ML ~~LOC~~ SOPN: 2.0000 mg | PEN_INJECTOR | SUBCUTANEOUS | 5 refills | Status: DC

## 2021-08-11 MED ORDER — GLIPIZIDE 5 MG PO TABS: 5.0000 mg | ORAL_TABLET | Freq: Every day | ORAL | 0 refills | Status: DC

## 2021-08-11 NOTE — Patient Instructions (Addendum)
Please start taking Ozempic 2 mg instead of 1 mg dose. ?Please start taking Glipizide 5 mg only once daily. ? ?Please continue to take other medications as prescribed. ? ?Please continue to follow low carb diet and perform moderate exercise/walking at least 150 mins/week. ?

## 2021-08-11 NOTE — Assessment & Plan Note (Signed)
BP Readings from Last 1 Encounters:  ?08/11/21 136/88  ? ?Well-controlled with Verapamil 240 mg QD ?If persistently elevated, plan to switch to Amlodipine and add HCTZ ?Allergic to ACEi/ARB ?Counseled for compliance with the medications ?Advised DASH diet and moderate exercise/walking, at least 150 mins/week ?

## 2021-08-11 NOTE — Assessment & Plan Note (Signed)
Lab Results  ?Component Value Date  ? HGBA1C 6.3 08/11/2021  ? HGBA1C 6.3 08/11/2021  ? ?On Metformin, Ozempic and Glipizide ?Increased dose of Ozempic to 2 mg qw ?Decreased Glipizide to QD now ?Advised to follow diabetic diet ?Reviewed CMP and lipid panel ?Diabetic eye exam: Advised to follow up with Ophthalmology for diabetic eye exam ?

## 2021-08-11 NOTE — Assessment & Plan Note (Signed)
Uses CPAP regularly, continues to benefit from it 

## 2021-08-11 NOTE — Progress Notes (Signed)
? ?Established Patient Office Visit ? ?Subjective:  ?Patient ID: Adam Hahn, male    DOB: 1965-08-22  Age: 56 y.o. MRN: [id number] ? ?CC:  ?Chief Complaint  ?Patient presents with  ? Follow-up  ?  4 month follow up   ? ? ?HPI ?Adam Hahn is a 56 y.o. male with past medical history of HTN, DM and OA who presents for f/u of his chronic medical conditions. ? ?HTN: BP is well-controlled. Takes medications regularly. Patient denies headache, dizziness, chest pain, dyspnea or palpitations. ? ?Type II DM with HLD: His HbA1c was 6.3 in the office today.  He has been taking metformin, glipizide and Ozempic currently.  He also takes Crestor for HLD.  His blood glucose ranges around 100 at home.  He currently denies any polyuria, polydipsia or polyphagia. ? ?He uses CPAP regularly for OSA. ? ? ? ?Past Medical History:  ?Diagnosis Date  ? Allergic rhinitis   ? Arthritis   ? knees,  right shoulder ac joint  ? History of exercise stress test 10-09-2001 in epic  ? normal  ? HTN (hypertension)   ? Newly diagnosed diabetes ([place]) 01/13/2017  ? 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 OSA on CPAP   ? S/P lateral meniscectomy of left knee 06/03/2011  ? Shoulder impingement, right   ? Type 2 diabetes mellitus ([place])   ? followed by pcp  ? Wears glasses   ? ? ?Past Surgical History:  ?Procedure Laterality Date  ? COLONOSCOPY WITH PROPOFOL N/A 08/13/2020  ? Procedure: COLONOSCOPY WITH PROPOFOL;  Surgeon: Harvel Quale, MD;  [facility];  Service: Gastroenterology;  Laterality: N/A;  am  ? KNEE SURGERY Left x4  last one 1996  ? LAPAROSCOPIC CHOLECYSTECTOMY  09-20-2007  _0   ? POLYPECTOMY  08/13/2020  ? Procedure: POLYPECTOMY;  Surgeon: Harvel Quale, MD;  [facility]: [facility];  Service: Gastroenterology;;  ? SHOULDER ARTHROSCOPY WITH ROTATOR CUFF REPAIR Right 10/17/2018  ? Procedure: Right shoulder arthroscopy with subacromial decompression, distal clavicle resection,  rotator  cuff DEBRIEDMENT, LABRIAL DEBRIDMENT;  Surgeon: Justice Britain, MD;  Location: [facility];  Service: Orthopedics;  Laterality: Right;  145mn  ? ? ?Family History  ?Problem Relation Age of Onset  ? Hypertension Father   ? Asthma Mother   ? Allergic rhinitis Neg Hx   ? Angioedema Neg Hx   ? Atopy Neg Hx   ? Eczema Neg Hx   ? Immunodeficiency Neg Hx   ? Urticaria Neg Hx   ? ? ?Social History  ? ?Socioeconomic History  ? Marital status: Married  ?  Spouse name: Not on file  ? Number of children: Not on file  ? Years of education: Not on file  ? Highest education level: Not on file  ?Occupational History  ? Not on file  ?Tobacco Use  ? Smoking status: Never  ? Smokeless tobacco: Never  ?Vaping Use  ? Vaping Use: Never used  ?Substance and Sexual Activity  ? Alcohol use: Yes  ?  Comment: occasionally  ? Drug use: Never  ? Sexual activity: Yes  ?Other Topics Concern  ? Not on file  ?Social History Narrative  ? Not on file  ? ?Social Determinants of Health  ? ?Financial Resource Strain: Not on file  ?Food Insecurity: Not on file  ?Transportation Needs: Not on file  ?Physical Activity: Not on file  ?Stress: Not on file  ?Social Connections: Not on file  ?Intimate  Partner Violence: Not on file  ? ? ?Outpatient Medications Prior to Visit  ?Medication Sig Dispense Refill  ? cetirizine (ZYRTEC ALLERGY) 10 MG tablet Take 1 tablet (10 mg total) by mouth daily. 30 tablet 0  ? Cholecalciferol 50 MCG (2000 UT) TABS TAKE ONE TABLET BY MOUTH EVERY DAY THIS REPLACES ERGOCALCIFEROL FOR TREATMENT OF VITAMIN D DEF    ? metFORMIN (GLUCOPHAGE) 1000 MG tablet TAKE (1) TABLET BY MOUTH TWICE A DAY WITH MEALS (BREAKFAST AND SUPPER). 60 tablet 0  ? rosuvastatin (CRESTOR) 10 MG tablet Take 10 mg by mouth daily.    ? verapamil (CALAN-SR) 240 MG CR tablet Take 1 tablet (240 mg total) by mouth daily. 30 tablet 3  ? benzonatate (TESSALON) 100 MG capsule Take 1-2 capsules (100-200 mg total) by mouth 3 (three) times daily as needed for  cough. 60 capsule 0  ? glipiZIDE (GLUCOTROL) 5 MG tablet Take 1 tablet (5 mg total) by mouth 2 (two) times daily before a meal. Getting meds from [place]. 60 tablet 0  ? promethazine-dextromethorphan (PROMETHAZINE-DM) 6.25-15 MG/5ML syrup Take 5 mLs by mouth at bedtime as needed for cough. 100 mL 0  ? pseudoephedrine (SUDAFED) 60 MG tablet Take 1 tablet (60 mg total) by mouth every 8 (eight) hours as needed for congestion. 30 tablet 0  ? Semaglutide, 1 MG/DOSE, (OZEMPIC, 1 MG/DOSE,) 4 MG/3ML SOPN INJECT 1MG SUBCUTANEOUSLY ONCE PER WEEK AS DIRECTED. 3 mL 3  ? ?No facility-administered medications prior to visit.  ? ? ?Allergies  ?Allergen Reactions  ? Indomethacin Other (See Comments)  ? Lotrel [Amlodipine Besy-Benazepril Hcl] Other (See Comments)  ?  lethargic  ? Tomato Hives  ? Ace Inhibitors Rash  ? Hydrocodone Hives and Rash  ? Lisinopril Rash  ? Losartan Potassium Rash  ? Valsartan Rash  ? ? ?ROS ?Review of Systems  ?Constitutional:  Negative for chills and fever.  ?HENT:  Negative for congestion and sore throat.   ?Eyes:  Negative for pain and discharge.  ?Respiratory:  Negative for cough and shortness of breath.   ?Cardiovascular:  Negative for chest pain and palpitations.  ?Gastrointestinal:  Negative for diarrhea, nausea and vomiting.  ?Endocrine: Negative for polydipsia and polyuria.  ?Genitourinary:  Negative for dysuria and hematuria.  ?Musculoskeletal:  Positive for arthralgias. Negative for neck pain and neck stiffness.  ?Skin:  Negative for rash.  ?Neurological:  Negative for dizziness, weakness, numbness and headaches.  ?Psychiatric/Behavioral:  Negative for agitation and behavioral problems.   ? ?  ?Objective:  ?  ?Physical Exam ?Vitals reviewed.  ?Constitutional:   ?   General: He is not in acute distress. ?   Appearance: He is obese. He is not diaphoretic.  ?HENT:  ?   Head: Normocephalic and atraumatic.  ?   Nose: Nose normal.  ?   Mouth/Throat:  ?   Mouth: Mucous membranes are moist.  ?Eyes:  ?    General: No scleral icterus. ?   Extraocular Movements: Extraocular movements intact.  ?Cardiovascular:  ?   Rate and Rhythm: Normal rate and regular rhythm.  ?   Pulses: Normal pulses.  ?   Heart sounds: Normal heart sounds. No murmur heard. ?Pulmonary:  ?   Breath sounds: Normal breath sounds. No wheezing or rales.  ?Abdominal:  ?   Palpations: Abdomen is soft.  ?   Tenderness: There is no abdominal tenderness.  ?Musculoskeletal:     ?   General: Swelling (B/l knee, mild) present.  ?   Cervical back:  Neck supple. No tenderness.  ?   Right lower leg: No edema.  ?   Left lower leg: No edema.  ?   Comments: Cystic structure noted over left hand  ?Skin: ?   General: Skin is warm.  ?   Findings: No rash.  ?Neurological:  ?   General: No focal deficit present.  ?   Mental Status: He is alert and oriented to person, place, and time.  ?   Cranial Nerves: No cranial nerve deficit.  ?   Sensory: No sensory deficit.  ?   Motor: No weakness.  ?Psychiatric:     ?   Mood and Affect: Mood normal.     ?   Behavior: Behavior normal.  ? ? ?BP 136/88 (BP Location: Right Arm, Patient Position: Sitting, Cuff Size: Normal)   Pulse 68   Resp 16   Ht 6' 1" (1.854 m)   Wt 286 lb 6.4 oz (129.9 kg)   SpO2 95%   BMI 37.79 kg/m?  ?Wt Readings from Last 3 Encounters:  ?08/11/21 286 lb 6.4 oz (129.9 kg)  ?07/13/21 284 lb (128.8 kg)  ?06/15/21 293 lb 0.6 oz (132.9 kg)  ? ? ?Lab Results  ?Component Value Date  ? TSH 0.603 04/01/2021  ? ?Lab Results  ?Component Value Date  ? WBC 7.3 04/01/2021  ? HGB 16.0 04/01/2021  ? HCT 47.5 04/01/2021  ? MCV 85 04/01/2021  ? PLT 284 04/01/2021  ? ?Lab Results  ?Component Value Date  ? NA 141 04/01/2021  ? K 4.7 04/01/2021  ? CO2 24 04/01/2021  ? GLUCOSE 109 (H) 04/01/2021  ? BUN 16 04/01/2021  ? CREATININE 1.14 04/01/2021  ? BILITOT 0.4 04/01/2021  ? ALKPHOS 63 04/01/2021  ? AST 33 04/01/2021  ? ALT 47 (H) 04/01/2021  ? PROT 7.4 04/01/2021  ? ALBUMIN 4.8 04/01/2021  ? CALCIUM 10.0 04/01/2021  ? EGFR 77  04/01/2021  ? ?Lab Results  ?Component Value Date  ? CHOL 168 04/01/2021  ? ?Lab Results  ?Component Value Date  ? HDL 53 04/01/2021  ? ?Lab Results  ?Component Value Date  ? [place] 97 04/01/2021  ? ?Lab Results

## 2021-08-11 NOTE — Assessment & Plan Note (Signed)
Diet modification and moderate exercise advised On GLP-1 agonist for type II DM 

## 2021-08-15 LAB — MICROALBUMIN / CREATININE URINE RATIO
Creatinine, Urine: 109.8 mg/dL
Microalb/Creat Ratio: 3 mg/g creat (ref 0–29)
Microalbumin, Urine: 3.3 ug/mL

## 2021-09-22 DIAGNOSIS — H469 Unspecified optic neuritis: Secondary | ICD-10-CM | POA: Diagnosis not present

## 2021-09-22 DIAGNOSIS — H4720 Unspecified optic atrophy: Secondary | ICD-10-CM | POA: Diagnosis not present

## 2021-10-09 ENCOUNTER — Encounter: Payer: Self-pay | Admitting: Orthopedic Surgery

## 2021-10-12 ENCOUNTER — Other Ambulatory Visit: Payer: Self-pay | Admitting: Orthopedic Surgery

## 2021-10-12 DIAGNOSIS — R2232 Localized swelling, mass and lump, left upper limb: Secondary | ICD-10-CM

## 2021-10-15 ENCOUNTER — Encounter: Payer: Self-pay | Admitting: Orthopedic Surgery

## 2021-10-15 ENCOUNTER — Ambulatory Visit: Payer: BC Managed Care – PPO | Admitting: Orthopedic Surgery

## 2021-10-15 DIAGNOSIS — R2232 Localized swelling, mass and lump, left upper limb: Secondary | ICD-10-CM | POA: Diagnosis not present

## 2021-10-15 NOTE — Progress Notes (Signed)
Office Visit Note   Patient: Adam Hahn           Date of Birth: 06-12-1965           MRN: [id number] Visit Date: 10/15/2021              Requested by: Carole Civil, MD [address] [place],  [place] [address] PCP: Lindell Spar, MD   Assessment & Plan: Visit Diagnoses:  1. Subcutaneous mass of left hand     Plan: Discussed with patient that I will have a great explanation for his hand lesion.  This could be a small callus or thickened palmar fascia.  It is generally asymptomatic for him.  We discussed continued monitoring versus surgical excision.  Given the lack of symptoms, he would like to continue to monitor this for now.  Follow-Up Instructions: No follow-ups on file.   Orders:  No orders of the defined types were placed in this encounter.  No orders of the defined types were placed in this encounter.     Procedures: No procedures performed   Clinical Data: No additional findings.   Subjective: Chief Complaint  Patient presents with   Left Middle Finger - Cyst    This is a 56 year old right-hand-dominant male presents with a mass in the palm of the left hand.  This was first noticed incidentally about a year ago.  It is occasionally painful when significant pressure is applied.  But is otherwise not symptomatic for him.  He is able to play golf and do all his activities without difficulty.  He denies any injury.  She denies any cuts, scrapes, or breaks in the skin in this area.  He thinks it might have mildly increased in size over the last year but is otherwise been fairly stable.    Review of Systems   Objective: Vital Signs: There were no vitals taken for this visit.  Physical Exam Constitutional:      Appearance: Normal appearance.  Cardiovascular:     Rate and Rhythm: Normal rate.     Pulses: Normal pulses.  Pulmonary:     Effort: Pulmonary effort is normal.  Skin:    General: Skin is warm and dry.     Capillary Refill:  Capillary refill takes less than 2 seconds.  Neurological:     Mental Status: He is alert.     Left Hand Exam   Tenderness  Left hand tenderness location: Very mildly TTP at mass over palm in line with ring finger around distal palmar crease.   Other  Erythema: absent Sensation: normal Pulse: present  Comments:  Small, subcentimeter palpable mass in palm in line with middle finger at level of distal palmar crease.  It is a few mm in diameter.  It is firm, round, and seems adherent to the overlying skin.  There are two similarly sized nodules in line with middle finger proximal to this superficial lesion.       Specialty Comments:  No specialty comments available.  Imaging: No results found.   PMFS History: Patient Active Problem List   Diagnosis Date Noted   Subcutaneous mass of left hand 10/15/2021   Ganglion cyst 06/19/2021   Hyperlipidemia 06/15/2021   Encounter for general adult medical examination with abnormal findings 04/13/2021   Allergic rhinitis 01/07/2021   Bilateral primary osteoarthritis of knee 01/07/2021   Incomplete rotator cuff tear or rupture of left shoulder, not specified as traumatic 01/07/2021   Male erectile dysfunction,  unspecified 01/07/2021   Polyneuropathy, unspecified 01/07/2021   Radiculopathy, lumbosacral region 01/07/2021   Vitamin D deficiency 01/07/2021   Type 2 diabetes mellitus with other specified complication ([place]) 81/85/6314   Morbid obesity ([place]) 05/09/2013   Obstructive sleep apnea 09/11/2012   HTN (hypertension) 09/11/2012   BACK PAIN 05/01/2009   Past Medical History:  Diagnosis Date   Allergic rhinitis    Arthritis    knees,  right shoulder ac joint   History of exercise stress test 10-09-2001 in epic   normal   HTN (hypertension)    Newly diagnosed diabetes ([place]) 01/13/2017   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   OSA on CPAP    S/P lateral meniscectomy of left knee 06/03/2011   Shoulder  impingement, right    Type 2 diabetes mellitus ([place])    followed by pcp   Wears glasses     Family History  Problem Relation Age of Onset   Hypertension Father    Asthma Mother    Allergic rhinitis Neg Hx    Angioedema Neg Hx    Atopy Neg Hx    Eczema Neg Hx    Immunodeficiency Neg Hx    Urticaria Neg Hx     Past Surgical History:  Procedure Laterality Date   COLONOSCOPY WITH PROPOFOL N/A 08/13/2020   Procedure: COLONOSCOPY WITH PROPOFOL;  Surgeon: Harvel Quale, MD;  [facility];  Service: Gastroenterology;  Laterality: N/A;  am   KNEE SURGERY Left x4  last one [place]  09-20-2007  '@[facility]'$    POLYPECTOMY  08/13/2020   Procedure: POLYPECTOMY;  Surgeon: Harvel Quale, MD;  [facility];  Service: Gastroenterology;;   SHOULDER ARTHROSCOPY WITH ROTATOR CUFF REPAIR Right 10/17/2018   Procedure: Right shoulder arthroscopy with subacromial decompression, distal clavicle resection,  rotator cuff DEBRIEDMENT, LABRIAL DEBRIDMENT;  Surgeon: Justice Britain, MD;  Location: [facility];  Service: Orthopedics;  Laterality: Right;  175mn   Social History   Occupational History   Not on file  Tobacco Use   Smoking status: Never   Smokeless tobacco: Never  Vaping Use   Vaping Use: Never used  Substance and Sexual Activity   Alcohol use: Yes    Comment: occasionally   Drug use: Never   Sexual activity: Yes

## 2021-12-14 ENCOUNTER — Ambulatory Visit: Payer: BC Managed Care – PPO | Admitting: Internal Medicine

## 2021-12-17 DIAGNOSIS — M7651 Patellar tendinitis, right knee: Secondary | ICD-10-CM | POA: Diagnosis not present

## 2021-12-17 DIAGNOSIS — M1711 Unilateral primary osteoarthritis, right knee: Secondary | ICD-10-CM | POA: Diagnosis not present

## 2021-12-18 ENCOUNTER — Ambulatory Visit: Payer: BC Managed Care – PPO | Admitting: Internal Medicine

## 2021-12-18 ENCOUNTER — Encounter: Payer: Self-pay | Admitting: Internal Medicine

## 2021-12-18 VITALS — BP 130/86 | HR 97 | Ht 73.0 in | Wt 281.4 lb

## 2021-12-18 DIAGNOSIS — I10 Essential (primary) hypertension: Secondary | ICD-10-CM | POA: Diagnosis not present

## 2021-12-18 DIAGNOSIS — E1169 Type 2 diabetes mellitus with other specified complication: Secondary | ICD-10-CM

## 2021-12-18 DIAGNOSIS — E559 Vitamin D deficiency, unspecified: Secondary | ICD-10-CM

## 2021-12-18 DIAGNOSIS — Z125 Encounter for screening for malignant neoplasm of prostate: Secondary | ICD-10-CM | POA: Diagnosis not present

## 2021-12-18 DIAGNOSIS — E782 Mixed hyperlipidemia: Secondary | ICD-10-CM

## 2021-12-18 DIAGNOSIS — Z23 Encounter for immunization: Secondary | ICD-10-CM | POA: Diagnosis not present

## 2021-12-18 DIAGNOSIS — G4733 Obstructive sleep apnea (adult) (pediatric): Secondary | ICD-10-CM

## 2021-12-18 DIAGNOSIS — N529 Male erectile dysfunction, unspecified: Secondary | ICD-10-CM

## 2021-12-18 LAB — POCT GLYCOSYLATED HEMOGLOBIN (HGB A1C): HbA1c POC (<> result, manual entry): 5.7 % (ref 4.0–5.6)

## 2021-12-18 NOTE — Assessment & Plan Note (Signed)
Diet modification and moderate exercise advised On GLP-1 agonist for type II DM

## 2021-12-18 NOTE — Assessment & Plan Note (Signed)
BP Readings from Last 1 Encounters:  12/18/21 130/86   Well-controlled with Verapamil 240 mg QD If persistently elevated, plan to switch to Amlodipine and add HCTZ Allergic to ACEi/ARB Counseled for compliance with the medications Advised DASH diet and moderate exercise/walking, at least 150 mins/week

## 2021-12-18 NOTE — Patient Instructions (Addendum)
Please stop taking Glipizide.  Please continue taking other medications as prescribed.  Please continue to follow low carb diet and perform moderate exercise/walking at least 150 mins/week.  Please get fasting blood tests done before the next visit.

## 2021-12-18 NOTE — Assessment & Plan Note (Signed)
Ordered PSA after discussing its limitations for prostate cancer screening, including false positive results leading additional investigations. 

## 2021-12-18 NOTE — Assessment & Plan Note (Signed)
Uses CPAP regularly, continues to benefit from it 

## 2021-12-18 NOTE — Assessment & Plan Note (Signed)
Lab Results  Component Value Date   HGBA1C 5.7 12/18/2021   Associated with HTN, OSA and HLD Well controlled On Metformin, Ozempic and Glipizide DC Glipizide now Advised to follow diabetic diet Reviewed CMP and lipid panel Diabetic eye exam: Advised to follow up with Ophthalmology for diabetic eye exam

## 2021-12-18 NOTE — Assessment & Plan Note (Signed)
On Crestor Check lipid profile 

## 2021-12-18 NOTE — Assessment & Plan Note (Signed)
Takes sildenafil as needed Check testosterone profile

## 2021-12-18 NOTE — Progress Notes (Signed)
 Established Patient Office Visit  Subjective:  Patient ID: Adam Hahn, male    DOB: 08/17/1965  Age: 56 y.o. MRN: [id number]  CC:  Chief Complaint  Patient presents with   Follow-up    Follow up    HPI Adam Hahn is a 56 y.o. male with past medical history of HTN, DM and OA who presents for f/u of her chronic medical conditions.  HTN: BP is well-controlled. Takes medications regularly. Patient denies headache, dizziness, chest pain, dyspnea or palpitations.   Type II DM with HLD: His HbA1c was 5.7 in the office today.  He has been taking metformin, glipizide and Ozempic currently.  He also takes Crestor for HLD.  His blood glucose ranges around 100 at home.  He currently denies any polyuria, polydipsia or polyphagia.  He uses CPAP regularly for OSA.  He reports erectile dysfunction, and takes sildenafil 100 mg as needed.  Denies any dysuria, hematuria, urethral discharge, urinary hesitancy or resistance currently.  He received flu vaccine today.  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   OSA on CPAP    S/P lateral meniscectomy of left knee 06/03/2011   Shoulder impingement, right    Type 2 diabetes mellitus (HCC)    followed by pcp   Wears glasses     Past Surgical History:  Procedure Laterality Date   COLONOSCOPY WITH PROPOFOL N/A 08/13/2020   Procedure: COLONOSCOPY WITH PROPOFOL;  Surgeon: Castaneda Mayorga, Daniel, MD;  Location: [facility];  Service: Gastroenterology;  Laterality: N/A;  am   KNEE SURGERY Left x4  last one 1996   LAPAROSCOPIC CHOLECYSTECTOMY  09-20-2007  @[facility]   POLYPECTOMY  08/13/2020   Procedure: POLYPECTOMY;  Surgeon: Castaneda Mayorga, Daniel, MD;  Location: [facility];  Service:  Gastroenterology;;   SHOULDER ARTHROSCOPY WITH ROTATOR CUFF REPAIR Right 10/17/2018   Procedure: Right shoulder arthroscopy with subacromial decompression, distal clavicle resection,  rotator cuff DEBRIEDMENT, LABRIAL DEBRIDMENT;  Surgeon: Supple, Kevin, MD;  Location: [facility];  Service: Orthopedics;  Laterality: Right;  120min    Family History  Problem Relation Age of Onset   Hypertension Father    Asthma Mother    Allergic rhinitis Neg Hx    Angioedema Neg Hx    Atopy Neg Hx    Eczema Neg Hx    Immunodeficiency Neg Hx    Urticaria Neg Hx     Social History   Socioeconomic History   Marital status: Married    Spouse name: Not on file   Number of children: Not on file   Years of education: Not on file   Highest education level: Not on file  Occupational History   Not on file  Tobacco Use   Smoking status: Never   Smokeless tobacco: Never  Vaping Use   Vaping Use: Never used  Substance and Sexual Activity   Alcohol use: Yes    Comment: occasionally   Drug use: Never   Sexual activity: Yes  Other Topics Concern   Not on file  Social History Narrative   Not on file   Social Determinants of Health   Financial Resource Strain: Not on file  Food Insecurity: Not on   file  Transportation Needs: Not on file  Physical Activity: Not on file  Stress: Not on file  Social Connections: Not on file  Intimate Partner Violence: Not on file    Outpatient Medications Prior to Visit  Medication Sig Dispense Refill   cetirizine (ZYRTEC ALLERGY) 10 MG tablet Take 1 tablet (10 mg total) by mouth daily. 30 tablet 0   Cholecalciferol 50 MCG (2000 UT) TABS TAKE ONE TABLET BY MOUTH EVERY DAY THIS REPLACES ERGOCALCIFEROL FOR TREATMENT OF VITAMIN D DEF     metFORMIN (GLUCOPHAGE) 1000 MG tablet TAKE (1) TABLET BY MOUTH TWICE A DAY WITH MEALS (BREAKFAST AND SUPPER). 60 tablet 0   rosuvastatin (CRESTOR) 10 MG tablet Take 10 mg by mouth daily.     Semaglutide, 2 MG/DOSE, 8  MG/3ML SOPN Inject 2 mg as directed once a week. 3 mL 5   sildenafil (VIAGRA) 100 MG tablet Take 100 mg by mouth daily as needed.     verapamil (CALAN-SR) 240 MG CR tablet Take 1 tablet (240 mg total) by mouth daily. 30 tablet 3   glipiZIDE (GLUCOTROL) 5 MG tablet Take 1 tablet (5 mg total) by mouth daily before breakfast. [facility] 0   No facility-administered medications prior to visit.    Allergies  Allergen Reactions   Indomethacin Other (See Comments)   Lotrel [Amlodipine Besy-Benazepril Hcl] Other (See Comments)    lethargic   Tomato Hives   Ace Inhibitors Rash   Hydrocodone Hives and Rash   Lisinopril Rash   Losartan Potassium Rash   Valsartan Rash    ROS Review of Systems  Constitutional:  Negative for chills and fever.  HENT:  Negative for congestion and sore throat.   Eyes:  Negative for pain and discharge.  Respiratory:  Negative for cough and shortness of breath.   Cardiovascular:  Negative for chest pain and palpitations.  Gastrointestinal:  Negative for diarrhea, nausea and vomiting.  Endocrine: Negative for polydipsia and polyuria.  Genitourinary:  Negative for dysuria and hematuria.  Musculoskeletal:  Positive for arthralgias. Negative for neck pain and neck stiffness.  Skin:  Negative for rash.  Neurological:  Negative for dizziness, weakness, numbness and headaches.  Psychiatric/Behavioral:  Negative for agitation and behavioral problems.       Objective:    Physical Exam Vitals reviewed.  Constitutional:      General: He is not in acute distress.    Appearance: He is obese. He is not diaphoretic.  HENT:     Head: Normocephalic and atraumatic.     Nose: Nose normal.     Mouth/Throat:     Mouth: Mucous membranes are moist.  Eyes:     General: No scleral icterus.    Extraocular Movements: Extraocular movements intact.  Cardiovascular:     Rate and Rhythm: Normal rate and regular rhythm.     Pulses: Normal pulses.     Heart  sounds: Normal heart sounds. No murmur heard. Pulmonary:     Breath sounds: Normal breath sounds. No wheezing or rales.  Musculoskeletal:        General: Swelling (B/l knee, mild) present.     Cervical back: Neck supple. No tenderness.     Right lower leg: No edema.     Left lower leg: No edema.     Comments: Cystic structure noted over left hand  Skin:    General: Skin is warm.     Findings: No rash.  Neurological:     General:   No focal deficit present.     Mental Status: He is alert and oriented to person, place, and time.     Cranial Nerves: No cranial nerve deficit.     Sensory: No sensory deficit.     Motor: No weakness.  Psychiatric:        Mood and Affect: Mood normal.        Behavior: Behavior normal.     BP 130/86 (BP Location: Left Arm, Patient Position: Sitting, Cuff Size: Large)   Pulse 97   Ht 6' 1" (1.854 m)   Wt 281 lb 6.4 oz (127.6 kg)   SpO2 97%   BMI 37.13 kg/m  Wt Readings from Last 3 Encounters:  12/18/21 281 lb 6.4 oz (127.6 kg)  08/11/21 286 lb 6.4 oz (129.9 kg)  07/13/21 284 lb (128.8 kg)    Lab Results  Component Value Date   TSH 0.603 04/01/2021   Lab Results  Component Value Date   WBC 7.3 04/01/2021   HGB 16.0 04/01/2021   HCT 47.5 04/01/2021   MCV 85 04/01/2021   PLT 284 04/01/2021   Lab Results  Component Value Date   NA 141 04/01/2021   K 4.7 04/01/2021   CO2 24 04/01/2021   GLUCOSE 109 (H) 04/01/2021   BUN 16 04/01/2021   CREATININE 1.14 04/01/2021   BILITOT 0.4 04/01/2021   ALKPHOS 63 04/01/2021   AST 33 04/01/2021   ALT 47 (H) 04/01/2021   PROT 7.4 04/01/2021   ALBUMIN 4.8 04/01/2021   CALCIUM 10.0 04/01/2021   EGFR 77 04/01/2021   Lab Results  Component Value Date   CHOL 168 04/01/2021   Lab Results  Component Value Date   HDL 53 04/01/2021   Lab Results  Component Value Date   LDLCALC 97 04/01/2021   Lab Results  Component Value Date   TRIG 101 04/01/2021   Lab Results  Component Value Date    CHOLHDL 3.2 04/01/2021   Lab Results  Component Value Date   HGBA1C 5.7 12/18/2021      Assessment & Plan:   Problem List Items Addressed This Visit       Cardiovascular and Mediastinum   HTN (hypertension)    BP Readings from Last 1 Encounters:  12/18/21 130/86  Well-controlled with Verapamil 240 mg QD If persistently elevated, plan to switch to Amlodipine and add HCTZ Allergic to ACEi/ARB Counseled for compliance with the medications Advised DASH diet and moderate exercise/walking, at least 150 mins/week      Relevant Medications   sildenafil (VIAGRA) 100 MG tablet   Other Relevant Orders   TSH     Respiratory   Obstructive sleep apnea    Uses CPAP regularly, continues to benefit from it        Endocrine   Type 2 diabetes mellitus with other specified complication (HCC) - Primary    Lab Results  Component Value Date   HGBA1C 5.7 12/18/2021  Associated with HTN, OSA and HLD Well controlled On Metformin, Ozempic and Glipizide DC Glipizide now Advised to follow diabetic diet Reviewed CMP and lipid panel Diabetic eye exam: Advised to follow up with Ophthalmology for diabetic eye exam      Relevant Orders   Hemoglobin A1c   CMP14+EGFR   POCT glycosylated hemoglobin (Hb A1C) (Completed)     Other   Morbid obesity (HCC)    Diet modification and moderate exercise advised On GLP-1 agonist for type II DM      Relevant Orders     TSH   Male erectile dysfunction, unspecified    Takes sildenafil as needed Check testosterone profile      Relevant Orders   TSH   CBC with Differential/Platelet   Testosterone,Free and Total   Vitamin D deficiency   Relevant Orders   VITAMIN D 25 Hydroxy (Vit-D Deficiency, Fractures)   Hyperlipidemia    On Crestor Check lipid profile      Relevant Medications   sildenafil (VIAGRA) 100 MG tablet   Other Relevant Orders   Lipid panel   Prostate cancer screening    Ordered PSA after discussing its limitations for  prostate cancer screening, including false positive results leading additional investigations.      Relevant Orders   PSA   Other Visit Diagnoses     Need for immunization against influenza       Relevant Orders   Flu Vaccine QUAD 26moIM (Fluarix, Fluzone & Alfiuria Quad PF) (Completed)       No orders of the defined types were placed in this encounter.   Follow-up: Return in about 4 months (around 04/19/2022) for Annual physical.    RLindell Spar MD

## 2021-12-19 DIAGNOSIS — M25561 Pain in right knee: Secondary | ICD-10-CM | POA: Diagnosis not present

## 2021-12-28 ENCOUNTER — Encounter: Payer: Self-pay | Admitting: Orthopedic Surgery

## 2021-12-28 ENCOUNTER — Other Ambulatory Visit: Payer: Self-pay | Admitting: *Deleted

## 2021-12-28 ENCOUNTER — Telehealth: Payer: Self-pay | Admitting: Internal Medicine

## 2021-12-28 ENCOUNTER — Ambulatory Visit: Payer: BC Managed Care – PPO | Admitting: Orthopedic Surgery

## 2021-12-28 VITALS — Ht 73.0 in | Wt 280.0 lb

## 2021-12-28 DIAGNOSIS — M7651 Patellar tendinitis, right knee: Secondary | ICD-10-CM

## 2021-12-28 DIAGNOSIS — E1169 Type 2 diabetes mellitus with other specified complication: Secondary | ICD-10-CM

## 2021-12-28 MED ORDER — SEMAGLUTIDE (2 MG/DOSE) 8 MG/3ML ~~LOC~~ SOPN: 2.0000 mg | PEN_INJECTOR | SUBCUTANEOUS | 5 refills | Status: DC

## 2021-12-28 NOTE — Progress Notes (Signed)
Chief Complaint  Patient presents with   Knee Pain    Right    Adam Hahn comes in today with acute right knee pain which started when he was on vacation.  He got out of the car and felt pain in the front of his knee and then he had trouble bending and straightening it off and having to lift his knee to move it  He was seen by [facility] I have their notes there was question of patellar tendinitis versus medial meniscal tear he had an x-ray and is already had or will have his MRI  He started on some indomethacin that upset his stomach he has been taking Aleve as needed  Using ice and rest as has been out of work  The knee is slightly better  He feels there is a grinding sensation behind the kneecap  Examination of the right knee reveals full extension no extensor lag but tenderness quite exquisite at the inferior pole the patella he has a prominent tibial tubercle he has a normal ACL he has no tenderness on the medial joint line there is no effusion  I think he probably has acute patellar tendinitis  He should continue to use ice, anti-inflammatories Aleve twice a day, rest  I will see him on September 7 after he has his MRI   Encounter Diagnosis  Name Primary?   Patellar tendonitis of right knee Yes

## 2021-12-28 NOTE — Telephone Encounter (Signed)
Medication refilled

## 2021-12-28 NOTE — Telephone Encounter (Signed)
Patient needs refill on   Semaglutide, 2 MG/DOSE, 8 MG/3ML SOPN    Wants med to go to [facility] on [address].

## 2021-12-28 NOTE — Patient Instructions (Signed)
Ice  Aleve twice a day  Rest

## 2021-12-29 DIAGNOSIS — M542 Cervicalgia: Secondary | ICD-10-CM | POA: Diagnosis not present

## 2021-12-29 DIAGNOSIS — M9903 Segmental and somatic dysfunction of lumbar region: Secondary | ICD-10-CM | POA: Diagnosis not present

## 2021-12-29 DIAGNOSIS — M62830 Muscle spasm of back: Secondary | ICD-10-CM | POA: Diagnosis not present

## 2021-12-29 DIAGNOSIS — M9901 Segmental and somatic dysfunction of cervical region: Secondary | ICD-10-CM | POA: Diagnosis not present

## 2022-01-06 DIAGNOSIS — S83249A Other tear of medial meniscus, current injury, unspecified knee, initial encounter: Secondary | ICD-10-CM | POA: Insufficient documentation

## 2022-01-07 ENCOUNTER — Ambulatory Visit: Payer: BC Managed Care – PPO | Admitting: Orthopedic Surgery

## 2022-01-07 ENCOUNTER — Encounter: Payer: Self-pay | Admitting: Orthopedic Surgery

## 2022-01-07 VITALS — Ht 73.0 in | Wt 280.0 lb

## 2022-01-07 DIAGNOSIS — M7651 Patellar tendinitis, right knee: Secondary | ICD-10-CM

## 2022-01-07 DIAGNOSIS — S83241A Other tear of medial meniscus, current injury, right knee, initial encounter: Secondary | ICD-10-CM | POA: Diagnosis not present

## 2022-01-07 NOTE — Progress Notes (Signed)
FOLLOW UP   Encounter Diagnosis  Name Primary?   Patellar tendonitis of right knee Yes     Chief Complaint  Patient presents with   Knee Pain    R/ feeling better than it was.    56 year old male follows up with MRI.  He is having less pain over the anterior part of his knee  I was able to review his MRI he does have an undersurface medial meniscus tear but primarily has tendinitis in his patellar tendon and that the area of maximal tenderness  His extension has improved his tenderness is less his medial meniscal signs are negative  He will continue with ice exercise pool therapy on his own anti-inflammatories with Aleve he will see Dr. Stann Mainland for follow-up

## 2022-01-21 DIAGNOSIS — M7651 Patellar tendinitis, right knee: Secondary | ICD-10-CM | POA: Diagnosis not present

## 2022-01-21 DIAGNOSIS — S83241D Other tear of medial meniscus, current injury, right knee, subsequent encounter: Secondary | ICD-10-CM | POA: Diagnosis not present

## 2022-02-05 ENCOUNTER — Encounter: Payer: Self-pay | Admitting: Internal Medicine

## 2022-02-05 ENCOUNTER — Ambulatory Visit (INDEPENDENT_AMBULATORY_CARE_PROVIDER_SITE_OTHER): Payer: BC Managed Care – PPO | Admitting: Internal Medicine

## 2022-02-05 DIAGNOSIS — Z7984 Long term (current) use of oral hypoglycemic drugs: Secondary | ICD-10-CM | POA: Diagnosis not present

## 2022-02-05 DIAGNOSIS — K529 Noninfective gastroenteritis and colitis, unspecified: Secondary | ICD-10-CM | POA: Diagnosis not present

## 2022-02-05 DIAGNOSIS — E1169 Type 2 diabetes mellitus with other specified complication: Secondary | ICD-10-CM | POA: Diagnosis not present

## 2022-02-05 MED ORDER — METFORMIN HCL ER 500 MG PO TB24: 500.0000 mg | ORAL_TABLET | Freq: Every day | ORAL | 5 refills | Status: AC

## 2022-02-05 MED ORDER — CHOLESTYRAMINE 4 G PO PACK: 4.0000 g | PACK | Freq: Two times a day (BID) | ORAL | 5 refills | Status: DC

## 2022-02-05 NOTE — Patient Instructions (Signed)
Please take Questran for diarrhea.  Please take Metformin 500 mg once daily only.

## 2022-02-05 NOTE — Progress Notes (Signed)
Virtual Visit via Telephone Note   This visit type was conducted via telephone. This format is felt to be most appropriate for this patient at this time.  The patient did not have access to video technology/had technical difficulties with video requiring transitioning to audio format only (telephone).  All issues noted in this document were discussed and addressed.  No physical exam could be performed with this format.  Evaluation Performed:  Follow-up visit  Date:  02/05/2022   ID:  Adam Hahn, DOB [birth year], MRN [id number]  Patient Location: Home Provider Location: Office/Clinic  Participants: Patient Location of Patient: Home Location of Provider: Telehealth Consent was obtain for visit to be over via telehealth. I verified that I am speaking with the correct person using two identifiers.  PCP:  Lindell Spar, MD   Chief Complaint: Loose BM  History of Present Illness:    Adam Hahn is a 56 y.o. male who has a televisit for complaint of chronic loose BM, which he attributes to [place].  Of note, he also takes metformin for type II DM.  He denies any melena or hematochezia.  He had a similar episode of loose BM in the past and had to take colestyramine.  He has history of cholecystectomy.  The patient does not have symptoms concerning for COVID-19 infection (fever, chills, cough, or new shortness of breath).   Past Medical, Surgical, Social History, Allergies, and Medications have been Reviewed.  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   OSA on CPAP    S/P lateral meniscectomy of left knee 06/03/2011   Shoulder impingement, right    Type 2 diabetes mellitus ([place])    followed by pcp   Wears glasses     Past Surgical History:  Procedure Laterality Date   COLONOSCOPY WITH PROPOFOL N/A 08/13/2020   Procedure: COLONOSCOPY WITH PROPOFOL;  Surgeon: Harvel Quale, MD;  [facility]: [facility];  Service: Gastroenterology;  Laterality: N/A;  am   KNEE SURGERY Left x4  last one 1996   LAPAROSCOPIC CHOLECYSTECTOMY  09-20-2007  '@[facility]'$    POLYPECTOMY  08/13/2020   Procedure: POLYPECTOMY;  Surgeon: Harvel Quale, MD;  [facility];  Service: Gastroenterology;;   SHOULDER ARTHROSCOPY WITH ROTATOR CUFF REPAIR Right 10/17/2018   Procedure: Right shoulder arthroscopy with subacromial decompression, distal clavicle resection,  rotator cuff DEBRIEDMENT, LABRIAL DEBRIDMENT;  Surgeon: Justice Britain, MD;  Location: [facility];  Service: Orthopedics;  Laterality: Right;  112mn     Current Meds  Medication Sig   cetirizine (ZYRTEC ALLERGY) 10 MG tablet Take 1 tablet (10 mg total) by mouth daily.   Cholecalciferol 50 MCG (2000 UT) TABS TAKE ONE TABLET BY MOUTH EVERY DAY THIS REPLACES ERGOCALCIFEROL FOR TREATMENT OF VITAMIN D DEF   cholestyramine (QUESTRAN) 4 g packet Take 1 packet (4 g total) by mouth 2 (two) times daily.   metFORMIN (GLUCOPHAGE-XR) 500 MG 24 hr tablet Take 1 tablet (500 mg total) by mouth daily with breakfast.   rosuvastatin (CRESTOR) 10 MG tablet Take 10 mg by mouth daily.   Semaglutide, 2 MG/DOSE, 8 MG/3ML SOPN Inject 2 mg as directed once a week.   sildenafil (  VIAGRA) 100 MG tablet Take 100 mg by mouth daily as needed.   verapamil (CALAN-SR) 240 MG CR tablet Take 1 tablet (240 mg total) by mouth daily.   [DISCONTINUED] metFORMIN (GLUCOPHAGE) 1000 MG tablet TAKE (1) TABLET BY MOUTH TWICE A DAY WITH MEALS (BREAKFAST AND SUPPER).     Allergies:   Indomethacin, Lotrel [amlodipine besy-benazepril hcl], Tomato, Ace inhibitors, Hydrocodone, Lisinopril, Losartan potassium, and Valsartan   ROS:   Please see the history of present illness.     All  other systems reviewed and are negative.   Labs/Other Tests and Data Reviewed:    Recent Labs: 04/01/2021: ALT 47; BUN 16; Creatinine, Ser 1.14; Hemoglobin 16.0; Platelets 284; Potassium 4.7; Sodium 141; TSH 0.603   Recent Lipid Panel Lab Results  Component Value Date/Time   CHOL 168 04/01/2021 08:24 AM   TRIG 101 04/01/2021 08:24 AM   HDL 53 04/01/2021 08:24 AM   CHOLHDL 3.2 04/01/2021 08:24 AM   CHOLHDL 2.7 04/17/2014 09:12 AM   LDLCALC 97 04/01/2021 08:24 AM    Wt Readings from Last 3 Encounters:  01/07/22 280 lb (127 kg)  12/28/21 280 lb (127 kg)  12/18/21 281 lb 6.4 oz (127.6 kg)     ASSESSMENT & PLAN:    Chronic diarrhea/loose BM Type II DM Could be due to metformin and/or Ozempic Decreased dose of metformin since his type II DM is tightly controlled currently Prefer to continue Ozempic for its benefit with better glycemic profile and weight loss Advised to follow small, frequent meals and avoid fatty food Colestyramine for loose BM  Time:   Today, I have spent 12 minutes reviewing the chart, including problem list, medications, and with the patient with telehealth technology discussing the above problems.   Medication Adjustments/Labs and Tests Ordered: Current medicines are reviewed at length with the patient today.  Concerns regarding medicines are outlined above.   Tests Ordered: No orders of the defined types were placed in this encounter.   Medication Changes: Meds ordered this encounter  Medications   cholestyramine (QUESTRAN) 4 g packet    Sig: Take 1 packet (4 g total) by mouth 2 (two) times daily.    Dispense:  60 each    Refill:  5   metFORMIN (GLUCOPHAGE-XR) 500 MG 24 hr tablet    Sig: Take 1 tablet (500 mg total) by mouth daily with breakfast.    Dispense:  30 tablet    Refill:  5     Note: This dictation was prepared with Dragon dictation along with smaller phrase technology. Similar sounding words can be transcribed inadequately or may  not be corrected upon review. Any transcriptional errors that result from this process are unintentional.      Disposition:  Follow up  Signed, Lindell Spar, MD  02/05/2022 8:24 PM     [facility]

## 2022-03-09 DIAGNOSIS — I10 Essential (primary) hypertension: Secondary | ICD-10-CM | POA: Diagnosis not present

## 2022-03-09 DIAGNOSIS — E1169 Type 2 diabetes mellitus with other specified complication: Secondary | ICD-10-CM | POA: Diagnosis not present

## 2022-03-09 DIAGNOSIS — Z125 Encounter for screening for malignant neoplasm of prostate: Secondary | ICD-10-CM | POA: Diagnosis not present

## 2022-03-09 DIAGNOSIS — E559 Vitamin D deficiency, unspecified: Secondary | ICD-10-CM | POA: Diagnosis not present

## 2022-03-09 DIAGNOSIS — N529 Male erectile dysfunction, unspecified: Secondary | ICD-10-CM | POA: Diagnosis not present

## 2022-03-12 LAB — LIPID PANEL
Chol/HDL Ratio: 2.4 ratio (ref 0.0–5.0)
Cholesterol, Total: 137 mg/dL (ref 100–199)
HDL: 56 mg/dL (ref >39)
LDL Chol Calc (NIH): 66 mg/dL (ref 0–99)
Triglycerides: 74 mg/dL (ref 0–149)
VLDL Cholesterol Cal: 15 mg/dL (ref 5–40)

## 2022-03-12 LAB — PSA: Prostate Specific Ag, Serum: 0.3 ng/mL (ref 0.0–4.0)

## 2022-03-12 LAB — CMP14+EGFR
ALT: 36 IU/L (ref 0–44)
AST: 22 IU/L (ref 0–40)
Albumin/Globulin Ratio: 1.8 (ref 1.2–2.2)
Albumin: 4.7 g/dL (ref 3.8–4.9)
Alkaline Phosphatase: 76 IU/L (ref 44–121)
BUN/Creatinine Ratio: 15 (ref 9–20)
BUN: 17 mg/dL (ref 6–24)
Bilirubin Total: 0.4 mg/dL (ref 0.0–1.2)
CO2: 24 mmol/L (ref 20–29)
Calcium: 9.7 mg/dL (ref 8.7–10.2)
Chloride: 102 mmol/L (ref 96–106)
Creatinine, Ser: 1.1 mg/dL (ref 0.76–1.27)
Globulin, Total: 2.6 g/dL (ref 1.5–4.5)
Glucose: 125 mg/dL — ABNORMAL HIGH (ref 70–99)
Potassium: 4.8 mmol/L (ref 3.5–5.2)
Sodium: 141 mmol/L (ref 134–144)
Total Protein: 7.3 g/dL (ref 6.0–8.5)
eGFR: 80 mL/min/{1.73_m2} (ref >59)

## 2022-03-12 LAB — CBC WITH DIFFERENTIAL/PLATELET
Basophils Absolute: 0 10*3/uL (ref 0.0–0.2)
Basos: 1 %
EOS (ABSOLUTE): 0.5 10*3/uL — ABNORMAL HIGH (ref 0.0–0.4)
Eos: 6 %
Hematocrit: 48 % (ref 37.5–51.0)
Hemoglobin: 16.4 g/dL (ref 13.0–17.7)
Immature Grans (Abs): 0 10*3/uL (ref 0.0–0.1)
Immature Granulocytes: 0 %
Lymphocytes Absolute: 3.1 10*3/uL (ref 0.7–3.1)
Lymphs: 38 %
MCH: 29.6 pg (ref 26.6–33.0)
MCHC: 34.2 g/dL (ref 31.5–35.7)
MCV: 87 fL (ref 79–97)
Monocytes Absolute: 0.5 10*3/uL (ref 0.1–0.9)
Monocytes: 6 %
Neutrophils Absolute: 4 10*3/uL (ref 1.4–7.0)
Neutrophils: 49 %
Platelets: 308 10*3/uL (ref 150–450)
RBC: 5.54 x10E6/uL (ref 4.14–5.80)
RDW: 12.7 % (ref 11.6–15.4)
WBC: 8.1 10*3/uL (ref 3.4–10.8)

## 2022-03-12 LAB — TESTOSTERONE,FREE AND TOTAL
Testosterone, Free: 2.2 pg/mL — ABNORMAL LOW (ref 7.2–24.0)
Testosterone: 240 ng/dL — ABNORMAL LOW (ref 264–916)

## 2022-03-12 LAB — VITAMIN D 25 HYDROXY (VIT D DEFICIENCY, FRACTURES): Vit D, 25-Hydroxy: 45.5 ng/mL (ref 30.0–100.0)

## 2022-03-12 LAB — HEMOGLOBIN A1C
Est. average glucose Bld gHb Est-mCnc: 134 mg/dL
Hgb A1c MFr Bld: 6.3 % — ABNORMAL HIGH (ref 4.8–5.6)

## 2022-03-12 LAB — TSH: TSH: 0.607 u[IU]/mL (ref 0.450–4.500)

## 2022-04-07 DIAGNOSIS — M62830 Muscle spasm of back: Secondary | ICD-10-CM | POA: Diagnosis not present

## 2022-04-07 DIAGNOSIS — M9901 Segmental and somatic dysfunction of cervical region: Secondary | ICD-10-CM | POA: Diagnosis not present

## 2022-04-07 DIAGNOSIS — M9903 Segmental and somatic dysfunction of lumbar region: Secondary | ICD-10-CM | POA: Diagnosis not present

## 2022-04-07 DIAGNOSIS — M542 Cervicalgia: Secondary | ICD-10-CM | POA: Diagnosis not present

## 2022-04-20 ENCOUNTER — Ambulatory Visit (INDEPENDENT_AMBULATORY_CARE_PROVIDER_SITE_OTHER): Payer: BC Managed Care – PPO | Admitting: Internal Medicine

## 2022-04-20 ENCOUNTER — Encounter: Payer: Self-pay | Admitting: Internal Medicine

## 2022-04-20 VITALS — BP 130/85 | HR 98 | Ht 73.0 in | Wt 266.2 lb

## 2022-04-20 DIAGNOSIS — J0110 Acute frontal sinusitis, unspecified: Secondary | ICD-10-CM

## 2022-04-20 DIAGNOSIS — Z0001 Encounter for general adult medical examination with abnormal findings: Secondary | ICD-10-CM

## 2022-04-20 DIAGNOSIS — E1169 Type 2 diabetes mellitus with other specified complication: Secondary | ICD-10-CM | POA: Diagnosis not present

## 2022-04-20 DIAGNOSIS — I10 Essential (primary) hypertension: Secondary | ICD-10-CM

## 2022-04-20 DIAGNOSIS — N529 Male erectile dysfunction, unspecified: Secondary | ICD-10-CM

## 2022-04-20 DIAGNOSIS — E291 Testicular hypofunction: Secondary | ICD-10-CM

## 2022-04-20 MED ORDER — AMOXICILLIN-POT CLAVULANATE 875-125 MG PO TABS: 1.0000 | ORAL_TABLET | Freq: Two times a day (BID) | ORAL | 0 refills | Status: DC

## 2022-04-20 MED ORDER — TESTOSTERONE 20.25 MG/ACT (1.62%) TD GEL: 40.5000 mg | Freq: Every day | TRANSDERMAL | 2 refills | Status: DC

## 2022-04-20 MED ORDER — SEMAGLUTIDE (2 MG/DOSE) 8 MG/3ML ~~LOC~~ SOPN: 2.0000 mg | PEN_INJECTOR | SUBCUTANEOUS | 5 refills | Status: DC

## 2022-04-20 NOTE — Assessment & Plan Note (Signed)
Lab Results  Component Value Date   HGBA1C 6.3 (H) 03/09/2022   Associated with HTN, OSA and HLD Well controlled On Metformin and Ozempic Advised to follow diabetic diet Reviewed CMP and lipid panel Diabetic eye exam: Advised to follow up with Ophthalmology for diabetic eye exam

## 2022-04-20 NOTE — Progress Notes (Unsigned)
Established Patient Office Visit  Subjective:  Patient ID: Adam Hahn, male    DOB: [birth year]  Age: 56 y.o. MRN: [id number]  CC:  Chief Complaint  Patient presents with   Annual Exam    Patient states he does have sinus infection, started on 04/11/2022    HPI Adam Hahn is a 56 y.o. male with past medical history of HTN, DM and OA who presents for annual physical.  He complains of nasal congestion, postnasal drip, cough and sinus pressure related headache for the last 1 week.  Denies any fever or chills.  Denies any dyspnea or wheezing.  HTN: BP is well-controlled. Takes medications regularly. Patient denies headache, dizziness, chest pain, dyspnea or palpitations.   Type II DM with HLD: His HbA1c is 6.3 now.  He has been taking metformin and Ozempic currently.  He also takes Crestor for HLD.  His blood glucose ranges around 100 at home.  He currently denies any polyuria, polydipsia or polyphagia.  He uses CPAP regularly for OSA.   He reports erectile dysfunction, he takes sildenafil 100 mg as needed.  Denies any dysuria, hematuria, urethral discharge, urinary hesitancy or resistance currently.   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   OSA on CPAP    S/P lateral meniscectomy of left knee 06/03/2011   Shoulder impingement, right    Type 2 diabetes mellitus ([place])    followed by pcp   Wears glasses     Past Surgical History:  Procedure Laterality Date   COLONOSCOPY WITH PROPOFOL N/A 08/13/2020   Procedure: COLONOSCOPY WITH PROPOFOL;  Surgeon: Harvel Quale, MD;  [facility];  Service: Gastroenterology;  Laterality: N/A;  am   KNEE SURGERY Left x4  last one [place]  09-20-2007  _0    POLYPECTOMY  08/13/2020   Procedure: POLYPECTOMY;  Surgeon: Harvel Quale, MD;  Location: [facility];  Service: Gastroenterology;;   SHOULDER ARTHROSCOPY WITH ROTATOR CUFF REPAIR Right 10/17/2018   Procedure: Right shoulder arthroscopy with subacromial decompression, distal clavicle resection,  rotator cuff DEBRIEDMENT, LABRIAL DEBRIDMENT;  Surgeon: Justice Britain, MD;  Location: [place];  Service: Orthopedics;  Laterality: Right;  163mn    Family History  Problem Relation Age of Onset   Hypertension Father    Asthma Mother    Allergic rhinitis Neg Hx    Angioedema Neg Hx    Atopy Neg Hx    Eczema Neg Hx    Immunodeficiency Neg Hx    Urticaria Neg Hx     Social History   Socioeconomic History   Marital status: Married    Spouse name: Not on file   Number of children: Not on file   Years of education: Not on file   Highest education level: Not on file  Occupational History   Not on file  Tobacco Use   Smoking status: Never   Smokeless tobacco: Never  Vaping Use   Vaping Use: Never used  Substance and Sexual Activity   Alcohol use: Yes    Comment: occasionally   Drug use: Never   Sexual activity: Yes  Other Topics Concern   Not on file  Social History Narrative   Not on file   Social Determinants of Health   Financial Resource Strain: Not on file  Food Insecurity: Not on file  Transportation Needs: Not on file  Physical Activity: Not on file  Stress: Not on file  Social Connections: Not on file  Intimate Partner Violence: Not on file    Outpatient Medications Prior to Visit  Medication Sig Dispense Refill   gabapentin (NEURONTIN) 100 MG capsule Take 2 capsules by mouth 2 (two) times daily.     cetirizine (ZYRTEC ALLERGY) 10 MG tablet Take 1 tablet (10 mg total) by mouth daily. 30 tablet 0   Cholecalciferol 50 MCG (2000 UT) TABS TAKE ONE TABLET BY MOUTH EVERY DAY THIS REPLACES ERGOCALCIFEROL FOR  TREATMENT OF VITAMIN D DEF     cholestyramine (QUESTRAN) 4 g packet Take 1 packet (4 g total) by mouth 2 (two) times daily. 60 each 5   metFORMIN (GLUCOPHAGE-XR) 500 MG 24 hr tablet Take 1 tablet (500 mg total) by mouth daily with breakfast. 30 tablet 5   rosuvastatin (CRESTOR) 10 MG tablet Take 10 mg by mouth daily.     sildenafil (VIAGRA) 100 MG tablet Take 100 mg by mouth daily as needed.     verapamil (CALAN-SR) 240 MG CR tablet Take 1 tablet (240 mg total) by mouth daily. 30 tablet 3   Semaglutide, 2 MG/DOSE, 8 MG/3ML SOPN Inject 2 mg as directed once a week. 3 mL 5   No facility-administered medications prior to visit.    Allergies  Allergen Reactions   Indomethacin Other (See Comments)   Lotrel [Amlodipine Besy-Benazepril Hcl] Other (See Comments)    lethargic   Tomato Hives   Ace Inhibitors Rash   Hydrocodone Hives and Rash   Lisinopril Rash   Losartan Potassium Rash   Valsartan Rash    ROS Review of Systems  Constitutional:  Negative for chills and fever.  HENT:  Negative for congestion and sore throat.   Eyes:  Negative for pain and discharge.  Respiratory:  Negative for cough and shortness of breath.   Cardiovascular:  Negative for chest pain and palpitations.  Gastrointestinal:  Negative for diarrhea, nausea and vomiting.  Endocrine: Negative for polydipsia and polyuria.  Genitourinary:  Negative for dysuria and hematuria.  Musculoskeletal:  Positive for arthralgias. Negative for neck pain and neck stiffness.  Skin:  Negative for rash.  Neurological:  Negative for dizziness, weakness, numbness and headaches.  Psychiatric/Behavioral:  Negative for agitation and behavioral problems.       Objective:    Physical Exam Vitals reviewed.  Constitutional:      General: He is not in acute distress.    Appearance: He is obese. He is not diaphoretic.  HENT:     Head: Normocephalic and atraumatic.     Nose: Nose normal.     Mouth/Throat:     Mouth: Mucous membranes  are moist.  Eyes:     General: No scleral icterus.    Extraocular Movements: Extraocular movements intact.  Cardiovascular:     Rate and Rhythm: Normal rate and regular rhythm.     Pulses: Normal pulses.     Heart sounds: Normal heart sounds. No murmur heard. Pulmonary:     Breath sounds: Normal breath sounds. No wheezing or rales.  Abdominal:     Palpations: Abdomen is soft.     Tenderness: There is no abdominal tenderness.  Musculoskeletal:        General: Swelling (B/l knee, mild) present.  Cervical back: Neck supple. No tenderness.     Right lower leg: No edema.     Left lower leg: No edema.     Comments: Cystic structure noted over left hand  Skin:    General: Skin is warm.     Findings: No rash.  Neurological:     General: No focal deficit present.     Mental Status: He is alert and oriented to person, place, and time.     Cranial Nerves: No cranial nerve deficit.     Sensory: No sensory deficit.     Motor: No weakness.  Psychiatric:        Mood and Affect: Mood normal.        Behavior: Behavior normal.     BP 130/85 (BP Location: Right Arm, Patient Position: Sitting, Cuff Size: Large)   Pulse 98   Ht _0  (1.854 m)   Wt 266 lb 3.2 oz (120.7 kg)   SpO2 98%   BMI 35.12 kg/m  Wt Readings from Last 3 Encounters:  04/20/22 266 lb 3.2 oz (120.7 kg)  01/07/22 280 lb (127 kg)  12/28/21 280 lb (127 kg)    Lab Results  Component Value Date   TSH 0.607 03/09/2022   Lab Results  Component Value Date   WBC 8.1 03/09/2022   HGB 16.4 03/09/2022   HCT 48.0 03/09/2022   MCV 87 03/09/2022   PLT 308 03/09/2022   Lab Results  Component Value Date   NA 141 03/09/2022   K 4.8 03/09/2022   CO2 24 03/09/2022   GLUCOSE 125 (H) 03/09/2022   BUN 17 03/09/2022   CREATININE 1.10 03/09/2022   BILITOT 0.4 03/09/2022   ALKPHOS 76 03/09/2022   AST 22 03/09/2022   ALT 36 03/09/2022   PROT 7.3 03/09/2022   ALBUMIN 4.7 03/09/2022   CALCIUM 9.7 03/09/2022   EGFR 80  03/09/2022   Lab Results  Component Value Date   CHOL 137 03/09/2022   Lab Results  Component Value Date   HDL 56 03/09/2022   Lab Results  Component Value Date   LDLCALC 66 03/09/2022   Lab Results  Component Value Date   TRIG 74 03/09/2022   Lab Results  Component Value Date   CHOLHDL 2.4 03/09/2022   Lab Results  Component Value Date   HGBA1C 6.3 (H) 03/09/2022      Assessment & Plan:   Problem List Items Addressed This Visit       Cardiovascular and Mediastinum   HTN (hypertension)    BP Readings from Last 1 Encounters:  04/20/22 130/85  Well-controlled with Verapamil 240 mg QD If persistently elevated, plan to switch to Amlodipine and add HCTZ Allergic to ACEi/ARB Counseled for compliance with the medications Advised DASH diet and moderate exercise/walking, at least 150 mins/week      Relevant Orders   CBC with Differential/Platelet     Respiratory   Acute non-recurrent frontal sinusitis    Started empiric Augmentin as he has persistent symptoms despite symptomatic treatment Nasal saline spray as needed Zyrtec for allergies      Relevant Medications   amoxicillin-clavulanate (AUGMENTIN) 875-125 MG tablet     Endocrine   Type 2 diabetes mellitus with other specified complication ([place])    Lab Results  Component Value Date   HGBA1C 6.3 (H) 03/09/2022  Associated with HTN, OSA and HLD Well controlled On Metformin and Ozempic Advised to follow diabetic diet Reviewed CMP and lipid panel Diabetic eye exam: Advised  to follow up with Ophthalmology for diabetic eye exam      Relevant Medications   Semaglutide, 2 MG/DOSE, 8 MG/3ML SOPN   Other Relevant Orders   Hemoglobin A1c   Male hypogonadism    Has erectile dysfunction despite taking Viagra Checked testosterone profile - has low testosterone, started testosterone gel      Relevant Medications   Testosterone 20.25 MG/ACT (1.62%) GEL   Other Relevant Orders   CBC with Differential/Platelet    Testosterone,Free and Total     Other   Male erectile dysfunction, unspecified    Takes sildenafil as needed Checked testosterone profile - has low testosterone, started testosterone gel      Relevant Medications   Testosterone 20.25 MG/ACT (1.62%) GEL   Encounter for general adult medical examination with abnormal findings - Primary    Physical exam as documented. Counseling done  re healthy lifestyle involving commitment to 150 minutes exercise per week, heart healthy diet, and attaining healthy weight.The importance of adequate sleep also discussed. Changes in health habits are decided on by the patient with goals and time frames  set for achieving them. Immunization and cancer screening needs are specifically addressed at this visit.       Meds ordered this encounter  Medications   Testosterone 20.25 MG/ACT (1.62%) GEL    Sig: Place 40.5 mg onto the skin daily.    Dispense:  75 g    Refill:  2   amoxicillin-clavulanate (AUGMENTIN) 875-125 MG tablet    Sig: Take 1 tablet by mouth 2 (two) times daily.    Dispense:  14 tablet    Refill:  0   Semaglutide, 2 MG/DOSE, 8 MG/3ML SOPN    Sig: Inject 2 mg as directed once a week.    Dispense:  3 mL    Refill:  5    Follow-up: Return in about 3 months (around 07/20/2022) for DM and testosterone replacement.    Lindell Spar, MD

## 2022-04-20 NOTE — Assessment & Plan Note (Signed)
BP Readings from Last 1 Encounters:  04/20/22 130/85   Well-controlled with Verapamil 240 mg QD If persistently elevated, plan to switch to Amlodipine and add HCTZ Allergic to ACEi/ARB Counseled for compliance with the medications Advised DASH diet and moderate exercise/walking, at least 150 mins/week

## 2022-04-20 NOTE — Patient Instructions (Signed)
Please start applying 2 clicks of testosterone gel once a day.  Please continue to take other medications as prescribed.  Please continue to follow low carb diet and perform moderate exercise/walking at least 150 mins/week.

## 2022-04-22 DIAGNOSIS — J0110 Acute frontal sinusitis, unspecified: Secondary | ICD-10-CM | POA: Insufficient documentation

## 2022-04-22 DIAGNOSIS — E291 Testicular hypofunction: Secondary | ICD-10-CM | POA: Insufficient documentation

## 2022-04-22 NOTE — Assessment & Plan Note (Signed)

## 2022-04-22 NOTE — Assessment & Plan Note (Signed)
Has erectile dysfunction despite taking Viagra Checked testosterone profile - has low testosterone, started testosterone gel

## 2022-04-22 NOTE — Assessment & Plan Note (Signed)
Started empiric Augmentin as he has persistent symptoms despite symptomatic treatment Nasal saline spray as needed Zyrtec for allergies

## 2022-04-22 NOTE — Assessment & Plan Note (Signed)
Takes sildenafil as needed Checked testosterone profile - has low testosterone, started testosterone gel

## 2022-05-26 DIAGNOSIS — M542 Cervicalgia: Secondary | ICD-10-CM | POA: Diagnosis not present

## 2022-05-26 DIAGNOSIS — M546 Pain in thoracic spine: Secondary | ICD-10-CM | POA: Diagnosis not present

## 2022-05-26 DIAGNOSIS — M9901 Segmental and somatic dysfunction of cervical region: Secondary | ICD-10-CM | POA: Diagnosis not present

## 2022-05-26 DIAGNOSIS — M9902 Segmental and somatic dysfunction of thoracic region: Secondary | ICD-10-CM | POA: Diagnosis not present

## 2022-06-14 DIAGNOSIS — M9901 Segmental and somatic dysfunction of cervical region: Secondary | ICD-10-CM | POA: Diagnosis not present

## 2022-06-14 DIAGNOSIS — M542 Cervicalgia: Secondary | ICD-10-CM | POA: Diagnosis not present

## 2022-06-14 DIAGNOSIS — M9902 Segmental and somatic dysfunction of thoracic region: Secondary | ICD-10-CM | POA: Diagnosis not present

## 2022-06-14 DIAGNOSIS — M546 Pain in thoracic spine: Secondary | ICD-10-CM | POA: Diagnosis not present

## 2022-07-01 ENCOUNTER — Encounter: Payer: Self-pay | Admitting: Radiology

## 2022-07-12 DIAGNOSIS — M25561 Pain in right knee: Secondary | ICD-10-CM | POA: Diagnosis not present

## 2022-07-12 DIAGNOSIS — E291 Testicular hypofunction: Secondary | ICD-10-CM | POA: Diagnosis not present

## 2022-07-12 DIAGNOSIS — I10 Essential (primary) hypertension: Secondary | ICD-10-CM | POA: Diagnosis not present

## 2022-07-12 DIAGNOSIS — E1169 Type 2 diabetes mellitus with other specified complication: Secondary | ICD-10-CM | POA: Diagnosis not present

## 2022-07-16 LAB — CBC WITH DIFFERENTIAL/PLATELET
Basophils Absolute: 0.1 10*3/uL (ref 0.0–0.2)
Basos: 1 %
EOS (ABSOLUTE): 0.7 10*3/uL — ABNORMAL HIGH (ref 0.0–0.4)
Eos: 11 %
Hematocrit: 50.3 % (ref 37.5–51.0)
Hemoglobin: 17.2 g/dL (ref 13.0–17.7)
Immature Grans (Abs): 0 10*3/uL (ref 0.0–0.1)
Immature Granulocytes: 0 %
Lymphocytes Absolute: 2.6 10*3/uL (ref 0.7–3.1)
Lymphs: 39 %
MCH: 29.5 pg (ref 26.6–33.0)
MCHC: 34.2 g/dL (ref 31.5–35.7)
MCV: 86 fL (ref 79–97)
Monocytes Absolute: 0.5 10*3/uL (ref 0.1–0.9)
Monocytes: 7 %
Neutrophils Absolute: 2.8 10*3/uL (ref 1.4–7.0)
Neutrophils: 42 %
Platelets: 266 10*3/uL (ref 150–450)
RBC: 5.84 x10E6/uL — ABNORMAL HIGH (ref 4.14–5.80)
RDW: 11.7 % (ref 11.6–15.4)
WBC: 6.7 10*3/uL (ref 3.4–10.8)

## 2022-07-16 LAB — HEMOGLOBIN A1C
Est. average glucose Bld gHb Est-mCnc: 131 mg/dL
Hgb A1c MFr Bld: 6.2 % — ABNORMAL HIGH (ref 4.8–5.6)

## 2022-07-16 LAB — TESTOSTERONE,FREE AND TOTAL
Testosterone, Free: 6.2 pg/mL — ABNORMAL LOW (ref 7.2–24.0)
Testosterone: 274 ng/dL (ref 264–916)

## 2022-07-21 ENCOUNTER — Encounter: Payer: Self-pay | Admitting: Internal Medicine

## 2022-07-21 ENCOUNTER — Ambulatory Visit: Payer: BC Managed Care – PPO | Admitting: Internal Medicine

## 2022-07-21 VITALS — BP 135/90 | HR 100 | Ht 73.0 in | Wt 280.4 lb

## 2022-07-21 DIAGNOSIS — E1169 Type 2 diabetes mellitus with other specified complication: Secondary | ICD-10-CM

## 2022-07-21 DIAGNOSIS — G4733 Obstructive sleep apnea (adult) (pediatric): Secondary | ICD-10-CM | POA: Diagnosis not present

## 2022-07-21 DIAGNOSIS — E291 Testicular hypofunction: Secondary | ICD-10-CM | POA: Diagnosis not present

## 2022-07-21 DIAGNOSIS — I471 Supraventricular tachycardia, unspecified: Secondary | ICD-10-CM

## 2022-07-21 DIAGNOSIS — N529 Male erectile dysfunction, unspecified: Secondary | ICD-10-CM

## 2022-07-21 DIAGNOSIS — I10 Essential (primary) hypertension: Secondary | ICD-10-CM

## 2022-07-21 MED ORDER — NEBIVOLOL HCL 2.5 MG PO TABS: 2.5000 mg | ORAL_TABLET | Freq: Every day | ORAL | 1 refills | Status: DC

## 2022-07-21 MED ORDER — TESTOSTERONE 20.25 MG/ACT (1.62%) TD GEL: 40.5000 mg | Freq: Every day | TRANSDERMAL | 2 refills | Status: DC

## 2022-07-21 NOTE — Patient Instructions (Signed)
Please start taking Bystolic once daily.  Please continue taking other medications as prescribed.  Please continue to follow low carb diet and perform moderate exercise/walking at least 150 mins/week.

## 2022-07-21 NOTE — Assessment & Plan Note (Signed)
BMI Readings from Last 3 Encounters:  07/21/22 36.99 kg/m  04/20/22 35.12 kg/m  01/07/22 36.94 kg/m   Associated with HTN, type 2 DM, HLD and OSA Diet modification and moderate exercise advised On GLP-1 agonist for type II DM

## 2022-07-21 NOTE — Progress Notes (Signed)
Established Patient Office Visit  Subjective:  Patient ID: Adam Hahn, male    DOB: June 22, 1965  Age: 57 y.o. MRN: [id number]  CC:  Chief Complaint  Patient presents with   Diabetes    Three month follow up for diabetes and testosterone    HPI Adam Hahn is a 57 y.o. male with past medical history of HTN, DM and OA who presents for f/u of his chronic medical conditions.  HTN: BP is elevated. Takes medications regularly. Patient denies headache, dizziness, chest pain, dyspnea or palpitations.  Type II DM with HLD: His HbA1c is 6.2 now.  He has been taking metformin and Ozempic currently.  He also takes Crestor for HLD.  His blood glucose ranges around 100 at home.  He currently denies any polyuria, polydipsia or polyphagia.  He has has been gaining weight due to lack of physical activity due to constant knee pain.   He uses CPAP regularly for OSA.  He recently had sleep study at [place], and was found to have episode of SVT overnight.  He is planned to get cardiac monitor and see cardiologist at [facility].  He reports erectile dysfunction, he takes sildenafil 100 mg as needed.  He has started using testosterone gel for testosterone deficiency.  His testosterone level has slightly improved now.  Denies any dysuria, hematuria, urethral discharge, urinary hesitancy or resistance currently.     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   OSA on CPAP    S/P lateral meniscectomy of left knee 06/03/2011   Shoulder impingement, right    Type 2 diabetes mellitus ([place])    followed by pcp   Wears glasses     Past Surgical History:  Procedure Laterality Date   COLONOSCOPY WITH PROPOFOL N/A 08/13/2020   Procedure: COLONOSCOPY WITH  PROPOFOL;  Surgeon: Harvel Quale, MD;  [facility];  Service: Gastroenterology;  Laterality: N/A;  am   KNEE SURGERY Left x4  last one 1996   LAPAROSCOPIC CHOLECYSTECTOMY  09-20-2007  @[facility]    POLYPECTOMY  08/13/2020   Procedure: POLYPECTOMY;  Surgeon: Harvel Quale, MD;  [facility];  Service: Gastroenterology;;   SHOULDER ARTHROSCOPY WITH ROTATOR CUFF REPAIR Right 10/17/2018   Procedure: Right shoulder arthroscopy with subacromial decompression, distal clavicle resection,  rotator cuff DEBRIEDMENT, LABRIAL DEBRIDMENT;  Surgeon: Justice Britain, MD;  Location: [place];  Service: Orthopedics;  Laterality: Right;  149min    Family History  Problem Relation Age of Onset   Hypertension Father    Asthma Mother    Allergic rhinitis Neg Hx    Angioedema Neg Hx    Atopy Neg Hx    Eczema Neg Hx    Immunodeficiency Neg Hx    Urticaria Neg Hx     Social History   Socioeconomic History   Marital status: Married    Spouse name: Not on file   Number of children: Not on file   Years of education: Not on file   Highest education level: Not on file  Occupational History   Not on file  Tobacco Use   Smoking status: Never   Smokeless tobacco: Never  Vaping Use   Vaping  Use: Never used  Substance and Sexual Activity   Alcohol use: Yes    Comment: occasionally   Drug use: Never   Sexual activity: Yes  Other Topics Concern   Not on file  Social History Narrative   Not on file   Social Determinants of Health   Financial Resource Strain: Not on file  Food Insecurity: Not on file  Transportation Needs: Not on file  Physical Activity: Not on file  Stress: Not on file  Social Connections: Not on file  Intimate Partner Violence: Not on file    Outpatient Medications Prior to Visit  Medication Sig Dispense Refill   cetirizine (ZYRTEC ALLERGY) 10 MG tablet Take 1 tablet (10 mg total) by mouth daily. 30 tablet 0    Cholecalciferol 50 MCG (2000 UT) TABS TAKE ONE TABLET BY MOUTH EVERY DAY THIS REPLACES ERGOCALCIFEROL FOR TREATMENT OF VITAMIN D DEF     cholestyramine (QUESTRAN) 4 g packet Take 1 packet (4 g total) by mouth 2 (two) times daily. 60 each 5   gabapentin (NEURONTIN) 100 MG capsule Take 2 capsules by mouth 2 (two) times daily.     metFORMIN (GLUCOPHAGE-XR) 500 MG 24 hr tablet Take 1 tablet (500 mg total) by mouth daily with breakfast. 30 tablet 5   rosuvastatin (CRESTOR) 10 MG tablet Take 10 mg by mouth daily.     Semaglutide, 2 MG/DOSE, 8 MG/3ML SOPN Inject 2 mg as directed once a week. 3 mL 5   sildenafil (VIAGRA) 100 MG tablet Take 100 mg by mouth daily as needed.     verapamil (CALAN-SR) 240 MG CR tablet Take 1 tablet (240 mg total) by mouth daily. 30 tablet 3   amoxicillin-clavulanate (AUGMENTIN) 875-125 MG tablet Take 1 tablet by mouth 2 (two) times daily. 14 tablet 0   Testosterone 20.25 MG/ACT (1.62%) GEL Place 40.5 mg onto the skin daily. 75 g 2   No facility-administered medications prior to visit.    Allergies  Allergen Reactions   Indomethacin Other (See Comments)   Lotrel [Amlodipine Besy-Benazepril Hcl] Other (See Comments)    lethargic   Tomato Hives   Ace Inhibitors Rash   Hydrocodone Hives and Rash   Lisinopril Rash   Losartan Potassium Rash   Valsartan Rash    ROS Review of Systems  Constitutional:  Negative for chills and fever.  HENT:  Negative for congestion and sore throat.   Eyes:  Negative for pain and discharge.  Respiratory:  Negative for cough and shortness of breath.   Cardiovascular:  Negative for chest pain and palpitations.  Gastrointestinal:  Negative for diarrhea, nausea and vomiting.  Endocrine: Negative for polydipsia and polyuria.  Genitourinary:  Negative for dysuria and hematuria.  Musculoskeletal:  Positive for arthralgias. Negative for neck pain and neck stiffness.  Skin:  Negative for rash.  Neurological:  Negative for dizziness, weakness,  numbness and headaches.  Psychiatric/Behavioral:  Negative for agitation and behavioral problems.       Objective:    Physical Exam Vitals reviewed.  Constitutional:      General: He is not in acute distress.    Appearance: He is obese. He is not diaphoretic.  HENT:     Head: Normocephalic and atraumatic.     Nose: Nose normal.     Mouth/Throat:     Mouth: Mucous membranes are moist.  Eyes:     General: No scleral icterus.    Extraocular Movements: Extraocular movements intact.  Cardiovascular:     Rate and  Rhythm: Normal rate and regular rhythm.     Pulses: Normal pulses.     Heart sounds: Normal heart sounds. No murmur heard. Pulmonary:     Breath sounds: Normal breath sounds. No wheezing or rales.  Abdominal:     Palpations: Abdomen is soft.     Tenderness: There is no abdominal tenderness.  Musculoskeletal:        General: Swelling (B/l knee, mild) present.     Cervical back: Neck supple. No tenderness.     Right lower leg: No edema.     Left lower leg: No edema.     Comments: Cystic structure noted over left hand  Skin:    General: Skin is warm.     Findings: No rash.  Neurological:     General: No focal deficit present.     Mental Status: He is alert and oriented to person, place, and time.     Cranial Nerves: No cranial nerve deficit.     Sensory: No sensory deficit.     Motor: No weakness.  Psychiatric:        Mood and Affect: Mood normal.        Behavior: Behavior normal.     BP (!) 135/90 (BP Location: Left Arm, Patient Position: Sitting, Cuff Size: Large)   Pulse 100   Ht 6\' 1"  (1.854 m)   Wt 280 lb 6.4 oz (127.2 kg)   SpO2 96%   BMI 36.99 kg/m  Wt Readings from Last 3 Encounters:  07/21/22 280 lb 6.4 oz (127.2 kg)  04/20/22 266 lb 3.2 oz (120.7 kg)  01/07/22 280 lb (127 kg)    Lab Results  Component Value Date   TSH 0.607 03/09/2022   Lab Results  Component Value Date   WBC 6.7 07/12/2022   HGB 17.2 07/12/2022   HCT 50.3 07/12/2022    MCV 86 07/12/2022   PLT 266 07/12/2022   Lab Results  Component Value Date   NA 141 03/09/2022   K 4.8 03/09/2022   CO2 24 03/09/2022   GLUCOSE 125 (H) 03/09/2022   BUN 17 03/09/2022   CREATININE 1.10 03/09/2022   BILITOT 0.4 03/09/2022   ALKPHOS 76 03/09/2022   AST 22 03/09/2022   ALT 36 03/09/2022   PROT 7.3 03/09/2022   ALBUMIN 4.7 03/09/2022   CALCIUM 9.7 03/09/2022   EGFR 80 03/09/2022   Lab Results  Component Value Date   CHOL 137 03/09/2022   Lab Results  Component Value Date   HDL 56 03/09/2022   Lab Results  Component Value Date   LDLCALC 66 03/09/2022   Lab Results  Component Value Date   TRIG 74 03/09/2022   Lab Results  Component Value Date   CHOLHDL 2.4 03/09/2022   Lab Results  Component Value Date   HGBA1C 6.2 (H) 07/12/2022      Assessment & Plan:   Problem List Items Addressed This Visit       Cardiovascular and Mediastinum   HTN (hypertension)    BP Readings from Last 1 Encounters:  07/21/22 (!) 135/90  Uncontrolled with Verapamil 240 mg QD Added Bystolic considering recent PSVT Allergic to ACEi/ARB Counseled for compliance with the medications Advised DASH diet and moderate exercise/walking, at least 150 mins/week      Relevant Medications   nebivolol (BYSTOLIC) 2.5 MG tablet   PSVT (paroxysmal supraventricular tachycardia) - Primary    Not[place]) Planned to get Holter monitor and see cardiologist at [facility]  Relevant Medications   nebivolol (BYSTOLIC) 2.5 MG tablet     Respiratory   Obstructive sleep apnea    Uses CPAP regularly, continues to benefit from it        Endocrine   Type 2 diabetes mellitus with other specified complication ([facility])    Lab Results  Component Value Date   HGBA1C 6.2 (H) 07/12/2022  Associated with HTN, OSA and HLD Well controlled On Metformin and Ozempic Advised to follow diabetic diet Reviewed CMP and lipid panel Diabetic eye exam: Advised to follow up with Ophthalmology  for diabetic eye exam      Male hypogonadism    Has erectile dysfunction despite taking Viagra Checked testosterone profile - has low testosterone, but improving with testosterone gel      Relevant Medications   Testosterone 20.25 MG/ACT (1.62%) GEL     Other   Morbid obesity ([place])    BMI Readings from Last 3 Encounters:  07/21/22 36.99 kg/m  04/20/22 35.12 kg/m  01/07/22 36.94 kg/m  Associated with HTN, type 2 DM, HLD and OSA Diet modification and moderate exercise advised On GLP-1 agonist for type II DM      Male erectile dysfunction, unspecified    Takes sildenafil as needed Checked testosterone profile - has low testosterone, uses testosterone gel now      Relevant Medications   Testosterone 20.25 MG/ACT (1.62%) GEL    Meds ordered this encounter  Medications   Testosterone 20.25 MG/ACT (1.62%) GEL    Sig: Place 40.5 mg onto the skin daily.    Dispense:  75 g    Refill:  2   nebivolol (BYSTOLIC) 2.5 MG tablet    Sig: Take 1 tablet (2.5 mg total) by mouth daily.    Dispense:  30 tablet    Refill:  1    Follow-up: Return in about 6 weeks (around 09/01/2022) for HTN.    Lindell Spar, MD

## 2022-07-21 NOTE — Assessment & Plan Note (Signed)
Has erectile dysfunction despite taking Viagra Checked testosterone profile - has low testosterone, but improving with testosterone gel

## 2022-07-21 NOTE — Assessment & Plan Note (Signed)
Noted [place] ([place]) Planned to get Holter monitor and see cardiologist at [facility]

## 2022-07-21 NOTE — Assessment & Plan Note (Signed)
Lab Results  Component Value Date   HGBA1C 6.2 (H) 07/12/2022   Associated with HTN, OSA and HLD Well controlled On Metformin and Ozempic Advised to follow diabetic diet Reviewed CMP and lipid panel Diabetic eye exam: Advised to follow up with Ophthalmology for diabetic eye exam

## 2022-07-21 NOTE — Assessment & Plan Note (Addendum)
Takes sildenafil as needed Checked testosterone profile - has low testosterone, uses testosterone gel now

## 2022-07-21 NOTE — Assessment & Plan Note (Signed)
BP Readings from Last 1 Encounters:  07/21/22 (!) 135/90   Uncontrolled with Verapamil 240 mg QD Added Bystolic considering recent PSVT Allergic to ACEi/ARB Counseled for compliance with the medications Advised DASH diet and moderate exercise/walking, at least 150 mins/week

## 2022-07-21 NOTE — Assessment & Plan Note (Signed)
Uses CPAP regularly, continues to benefit from it ?

## 2022-07-26 ENCOUNTER — Ambulatory Visit: Payer: BC Managed Care – PPO | Admitting: Orthopedic Surgery

## 2022-08-13 DIAGNOSIS — M9902 Segmental and somatic dysfunction of thoracic region: Secondary | ICD-10-CM | POA: Diagnosis not present

## 2022-08-13 DIAGNOSIS — M546 Pain in thoracic spine: Secondary | ICD-10-CM | POA: Diagnosis not present

## 2022-08-13 DIAGNOSIS — M542 Cervicalgia: Secondary | ICD-10-CM | POA: Diagnosis not present

## 2022-08-13 DIAGNOSIS — M9901 Segmental and somatic dysfunction of cervical region: Secondary | ICD-10-CM | POA: Diagnosis not present

## 2022-08-18 ENCOUNTER — Encounter: Payer: Self-pay | Admitting: Family Medicine

## 2022-08-18 ENCOUNTER — Ambulatory Visit: Payer: BC Managed Care – PPO | Admitting: Family Medicine

## 2022-08-18 VITALS — BP 138/82 | HR 96 | Ht 73.0 in | Wt 273.0 lb

## 2022-08-18 DIAGNOSIS — J0110 Acute frontal sinusitis, unspecified: Secondary | ICD-10-CM

## 2022-08-18 MED ORDER — AZELASTINE HCL 0.1 % NA SOLN: 2.0000 | Freq: Two times a day (BID) | NASAL | 0 refills | Status: DC

## 2022-08-18 MED ORDER — AMOXICILLIN-POT CLAVULANATE 875-125 MG PO TABS: 1.0000 | ORAL_TABLET | Freq: Two times a day (BID) | ORAL | 0 refills | Status: AC

## 2022-08-18 NOTE — Assessment & Plan Note (Signed)
Azelastine nasal spray ordered Started empiric Augmentin twice daily for 5 days Encouraged to continue taking Zyrtec for allergies

## 2022-08-18 NOTE — Patient Instructions (Signed)
I appreciate the opportunity to provide care to you today!    Take medication as prescribed. Increase fluids and allow for plenty of rest. Recommend Tylenol as needed for pain, fever, or general discomfort. Warm salt water gargles 3-4 times daily to help with throat pain or discomfort. Recommend using a humidifier at bedtime during sleep to help with cough and nasal congestion. Follow-up if your symptoms do not improve     Please continue to a heart-healthy diet and increase your physical activities. Try to exercise for at least five days a week.      It was a pleasure to see you and I look forward to continuing to work together on your health and well-being. Please do not hesitate to call the office if you need care or have questions about your care.   Have a wonderful day and week. With Gratitude, Gilmore Laroche MSN, FNP-BC

## 2022-08-18 NOTE — Progress Notes (Signed)
Acute Office Visit  Subjective:    Patient ID: Adam Hahn, male    DOB: November 08, 1965, 57 y.o.   MRN: [id number]  Chief Complaint  Patient presents with   Sinus Problem    Pt reports sinus infection, sinus pressure, drainage since 08/14/22.     HPI Patient is in today with complaints of facial pressure and pain, postnasal drainage, headaches and nasal congestion 08/14/2022.  He reports taking over-the-counter Mucinex  with minimal relief of his symptoms.  No nausea, vomiting, diarrhea reported.  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   OSA on CPAP    S/P lateral meniscectomy of left knee 06/03/2011   Shoulder impingement, right    Type 2 diabetes mellitus    followed by pcp   Wears glasses     Past Surgical History:  Procedure Laterality Date   COLONOSCOPY WITH PROPOFOL N/A 08/13/2020   Procedure: COLONOSCOPY WITH PROPOFOL;  Surgeon: Dolores Frame, MD;  [facility]: [facility];  Service: Gastroenterology;  Laterality: N/A;  am   KNEE SURGERY Left x4  last one 1996   LAPAROSCOPIC CHOLECYSTECTOMY  09-20-2007     POLYPECTOMY  08/13/2020   Procedure: POLYPECTOMY;  Surgeon: Dolores Frame, MD;  Location: [facility];  Service: Gastroenterology;;   SHOULDER ARTHROSCOPY WITH ROTATOR CUFF REPAIR Right 10/17/2018   Procedure: Right shoulder arthroscopy with subacromial decompression, distal clavicle resection,  rotator cuff DEBRIEDMENT, LABRIAL DEBRIDMENT;  Surgeon: Francena Hanly, MD;  Location: [facility];  Service: Orthopedics;  Laterality: Right;     Family History  Problem Relation Age of Onset   Hypertension Father    Asthma Mother    Allergic rhinitis Neg Hx    Angioedema Neg Hx     Atopy Neg Hx    Eczema Neg Hx    Immunodeficiency Neg Hx    Urticaria Neg Hx     Social History   Socioeconomic History   Marital status: Married    Spouse name: Not on file   Number of children: Not on file   Years of education: Not on file   Highest education level: Not on file  Occupational History   Not on file  Tobacco Use   Smoking status: Never   Smokeless tobacco: Never  Vaping Use   Vaping Use: Never used  Substance and Sexual Activity   Alcohol use: Yes    Comment: occasionally   Drug use: Never   Sexual activity: Yes  Other Topics Concern   Not on file  Social History Narrative   Not on file   Social Determinants of Health   Financial Resource Strain: Not on file  Food Insecurity: Not on file  Transportation Needs: Not on file  Physical Activity: Not on file  Stress: Not on file  Social Connections: Not on file  Intimate Partner Violence: Not on file    Outpatient Medications Prior to Visit  Medication Sig Dispense Refill   cetirizine (ZYRTEC ALLERGY) 10 MG tablet Take 1 tablet (10 mg total) by mouth daily. 30 tablet 0   Cholecalciferol 50 MCG (2000 UT) TABS TAKE ONE TABLET BY MOUTH EVERY DAY THIS REPLACES ERGOCALCIFEROL FOR TREATMENT OF VITAMIN D  DEF     cholestyramine (QUESTRAN) 4 g packet Take 1 packet (4 g total) by mouth 2 (two) times daily. 60 each 5   gabapentin (NEURONTIN) 100 MG capsule Take 2 capsules by mouth 2 (two) times daily.     metFORMIN (GLUCOPHAGE-XR) 500 MG 24 hr tablet Take 1 tablet (500 mg total) by mouth daily with breakfast. 30 tablet 5   nebivolol (BYSTOLIC) 2.5 MG tablet Take 1 tablet (2.5 mg total) by mouth daily. 30 tablet 1   rosuvastatin (CRESTOR) 10 MG tablet Take 10 mg by mouth daily.     Semaglutide, 2 MG/DOSE, 8 MG/3ML SOPN Inject 2 mg as directed once a week. 3 mL 5   sildenafil (VIAGRA) 100 MG tablet Take 100 mg by mouth daily as needed.     Testosterone 20.25 MG/ACT (1.62%) GEL Place 40.5 mg onto the skin daily.  75 g 2   verapamil (CALAN-SR) 240 MG CR tablet Take 1 tablet (240 mg total) by mouth daily. 30 tablet 3   No facility-administered medications prior to visit.    Allergies  Allergen Reactions   Indomethacin Other (See Comments)   Lotrel [Amlodipine Besy-Benazepril Hcl] Other (See Comments)    lethargic   Tomato Hives   Ace Inhibitors Rash   Hydrocodone Hives and Rash   Lisinopril Rash   Losartan Potassium Rash   Valsartan Rash    Review of Systems  Constitutional:  Negative for fatigue and fever.  HENT:  Positive for congestion, postnasal drip and sinus pressure. Negative for sore throat.   Eyes:  Negative for visual disturbance.  Respiratory:  Negative for cough, chest tightness and shortness of breath.   Cardiovascular:  Negative for chest pain and palpitations.  Neurological:  Negative for dizziness and headaches.       Objective:    Physical Exam HENT:     Head: Normocephalic.     Right Ear: External ear normal.     Left Ear: External ear normal.     Nose: No congestion or rhinorrhea.     Right Sinus: Frontal sinus tenderness present. No maxillary sinus tenderness.     Left Sinus: Frontal sinus tenderness present. No maxillary sinus tenderness.     Mouth/Throat:     Mouth: Mucous membranes are moist.     Dentition: Normal dentition.     Palate: No mass and lesions.     Tonsils: No tonsillar exudate or tonsillar abscesses.  Cardiovascular:     Rate and Rhythm: Regular rhythm.     Heart sounds: No murmur heard. Pulmonary:     Effort: No respiratory distress.     Breath sounds: Normal breath sounds.  Neurological:     Mental Status: He is alert.     BP 138/82   Pulse 96   Ht 6\' 1"  (1.854 m)   Wt 273 lb 0.6 oz (123.9 kg)   SpO2 98%   BMI 36.02 kg/m  Wt Readings from Last 3 Encounters:  08/18/22 273 lb 0.6 oz (123.9 kg)  07/21/22 280 lb 6.4 oz (127.2 kg)  04/20/22 266 lb 3.2 oz (120.7 kg)       Assessment & Plan:  Acute non-recurrent frontal  sinusitis Assessment & Plan: Azelastine nasal spray ordered Started empiric Augmentin twice daily for 5 days Encouraged to continue taking Zyrtec for allergies  Orders: -     Amoxicillin-Pot Clavulanate; Take 1 tablet by mouth 2 (two) times daily for 5 days.  Dispense: 10 tablet; Refill: 0 -  Azelastine HCl; Place 2 sprays into both nostrils 2 (two) times daily. Use in each nostril as directed  Dispense: 30 mL; Refill: 0    Gilmore Laroche, FNP

## 2022-08-25 ENCOUNTER — Ambulatory Visit: Payer: BC Managed Care – PPO | Admitting: Internal Medicine

## 2022-09-01 ENCOUNTER — Encounter: Payer: Self-pay | Admitting: Internal Medicine

## 2022-09-01 ENCOUNTER — Ambulatory Visit: Payer: BC Managed Care – PPO | Admitting: Internal Medicine

## 2022-09-01 VITALS — BP 126/81 | HR 81 | Ht 73.0 in | Wt 271.0 lb

## 2022-09-01 DIAGNOSIS — I471 Supraventricular tachycardia, unspecified: Secondary | ICD-10-CM

## 2022-09-01 DIAGNOSIS — I10 Essential (primary) hypertension: Secondary | ICD-10-CM

## 2022-09-01 DIAGNOSIS — E1169 Type 2 diabetes mellitus with other specified complication: Secondary | ICD-10-CM

## 2022-09-01 MED ORDER — NEBIVOLOL HCL 2.5 MG PO TABS: 2.5000 mg | ORAL_TABLET | Freq: Every day | ORAL | 1 refills | Status: DC

## 2022-09-01 NOTE — Assessment & Plan Note (Signed)
BMI Readings from Last 3 Encounters:  09/01/22 35.75 kg/m  08/18/22 36.02 kg/m  07/21/22 36.99 kg/m   Associated with HTN, type 2 DM, HLD and OSA Diet modification and moderate exercise advised On GLP-1 agonist for type II DM

## 2022-09-01 NOTE — Progress Notes (Signed)
Established Patient Office Visit  Subjective:  Patient ID: Adam Hahn, male    DOB: [birth year]  Age: 57 y.o. MRN: [id number]  CC:  Chief Complaint  Patient presents with   Hypertension    6 week follow up    HPI Adam Hahn is a 57 y.o. male with past medical history of HTN, DM and OA who presents for f/u of his chronic medical conditions.  HTN: BP is wnl now. Takes medications regularly. Patient denies headache, dizziness, chest pain, dyspnea or palpitations.  Type II DM with HLD: His HbA1c is 6.2 now.  He has been taking metformin and Ozempic currently.  He also takes Crestor for HLD.  His blood glucose ranges around 100 at home.  He currently denies any polyuria, polydipsia or polyphagia.  He has has been gaining weight due to lack of physical activity due to constant knee pain.   He uses CPAP regularly for OSA.  He recently had sleep study at [place], and was found to have episode of SVT overnight.  He is planned to get cardiac monitor and see cardiologist at [facility].  He reports erectile dysfunction, he takes sildenafil 100 mg as needed.  He has started using testosterone gel for testosterone deficiency.  His testosterone level has slightly improved now.  Denies any dysuria, hematuria, urethral discharge, urinary hesitancy or resistance currently.     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   OSA on CPAP    S/P lateral meniscectomy of left knee 06/03/2011   Shoulder impingement, right    Type 2 diabetes mellitus (HCC)    followed by pcp   Wears glasses     Past Surgical History:  Procedure Laterality Date   COLONOSCOPY WITH PROPOFOL N/A 08/13/2020   Procedure: COLONOSCOPY WITH PROPOFOL;  Surgeon: Dolores Frame, MD;  [facility];  Service: Gastroenterology;  Laterality: N/A;  am   KNEE SURGERY Left x4  last one 1996   LAPAROSCOPIC CHOLECYSTECTOMY  09-20-2007  @[facility]    POLYPECTOMY  08/13/2020   Procedure: POLYPECTOMY;  Surgeon: Dolores Frame, MD;  [facility]: [facility];  Service: Gastroenterology;;   SHOULDER ARTHROSCOPY WITH ROTATOR CUFF REPAIR Right 10/17/2018   Procedure: Right shoulder arthroscopy with subacromial decompression, distal clavicle resection,  rotator cuff DEBRIEDMENT, LABRIAL DEBRIDMENT;  Surgeon: Francena Hanly, MD;  Location: [facility];  Service: Orthopedics;  Laterality: Right;     Family History  Problem Relation Age of Onset   Hypertension Father    Asthma Mother    Allergic rhinitis Neg Hx    Angioedema Neg Hx    Atopy Neg Hx    Eczema Neg Hx    Immunodeficiency Neg Hx    Urticaria Neg Hx     Social History   Socioeconomic History   Marital status: Married    Spouse name: Not on file   Number of children: Not on file   Years of education: Not on file   Highest education level: Bachelor's degree (e.g., BA, AB, BS)  Occupational History   Not on file  Tobacco Use   Smoking status: Never   Smokeless tobacco: Never  Vaping Use   Vaping  Use: Never used  Substance and Sexual Activity   Alcohol use: Yes    Comment: occasionally   Drug use: Never   Sexual activity: Yes  Other Topics Concern   Not on file  Social History Narrative   Not on file   Social Determinants of Health   Financial Resource Strain: Not on file  Food Insecurity: No Food Insecurity (08/31/2022)   Hunger Vital Sign    Worried About Running Out of Food in the Last Year: Never true    Ran Out of Food in the Last Year: Never true  Transportation Needs: No Transportation Needs (08/31/2022)   PRAPARE - Administrator, Civil Service (Medical): No    Lack of Transportation (Non-Medical): No  Physical Activity: Sufficiently Active  (08/31/2022)   Exercise Vital Sign    Days of Exercise per Week: 5 days    Minutes of Exercise per Session: 40 min  Stress: No Stress Concern Present (08/31/2022)   [employer] of Occupational Health - Occupational Stress Questionnaire    Feeling of Stress : Not at all  Social Connections: Moderately Integrated (08/31/2022)   Social Connection and Isolation Panel [NHANES]    Frequency of Communication with Friends and Family: More than three times a week    Frequency of Social Gatherings with Friends and Family: Once a week    Attends Religious Services: More than 4 times per year    Active Member of [employer] or Organizations: No    Attends Engineer, structural: Not on file    Marital Status: Married  Catering manager Violence: Not on file    Outpatient Medications Prior to Visit  Medication Sig Dispense Refill   azelastine (ASTELIN) 0.1 % nasal spray Place 2 sprays into both nostrils 2 (two) times daily. Use in each nostril as directed 30 mL 0   cetirizine (ZYRTEC ALLERGY) 10 MG tablet Take 1 tablet (10 mg total) by mouth daily. 30 tablet 0   Cholecalciferol 50 MCG (2000 UT) TABS TAKE ONE TABLET BY MOUTH EVERY DAY THIS REPLACES ERGOCALCIFEROL FOR TREATMENT OF VITAMIN D DEF     cholestyramine (QUESTRAN) 4 g packet Take 1 packet (4 g total) by mouth 2 (two) times daily. 60 each 5   gabapentin (NEURONTIN) 100 MG capsule Take 2 capsules by mouth 2 (two) times daily.     metFORMIN (GLUCOPHAGE-XR) 500 MG 24 hr tablet Take 1 tablet (500 mg total) by mouth daily with breakfast. 30 tablet 5   rosuvastatin (CRESTOR) 10 MG tablet Take 10 mg by mouth daily.     Semaglutide, 2 MG/DOSE, 8 MG/3ML SOPN Inject 2 mg as directed once a week. 3 mL 5   sildenafil (VIAGRA) 100 MG tablet Take 100 mg by mouth daily as needed.     Testosterone 20.25 MG/ACT (1.62%) GEL Place 40.5 mg onto the skin daily. 75 g 2   verapamil (CALAN-SR) 240 MG CR tablet Take 1 tablet (240 mg total) by mouth daily. 30  tablet 3   nebivolol (BYSTOLIC) 2.5 MG tablet Take 1 tablet (2.5 mg total) by mouth daily. 30 tablet 1   No facility-administered medications prior to visit.    Allergies  Allergen Reactions   Indomethacin Other (See Comments)   Lotrel [Amlodipine Besy-Benazepril Hcl] Other (See Comments)    lethargic   Tomato Hives   Ace Inhibitors Rash   Hydrocodone Hives and Rash   Lisinopril Rash   Losartan Potassium Rash   Valsartan Rash  ROS Review of Systems  Constitutional:  Negative for chills and fever.  HENT:  Negative for congestion and sore throat.   Eyes:  Negative for pain and discharge.  Respiratory:  Negative for cough and shortness of breath.   Cardiovascular:  Negative for chest pain and palpitations.  Gastrointestinal:  Negative for diarrhea, nausea and vomiting.  Endocrine: Negative for polydipsia and polyuria.  Genitourinary:  Negative for dysuria and hematuria.  Musculoskeletal:  Positive for arthralgias. Negative for neck pain and neck stiffness.  Skin:  Negative for rash.  Neurological:  Negative for dizziness, weakness, numbness and headaches.  Psychiatric/Behavioral:  Negative for agitation and behavioral problems.       Objective:    Physical Exam Vitals reviewed.  Constitutional:      General: He is not in acute distress.    Appearance: He is obese. He is not diaphoretic.  HENT:     Head: Normocephalic and atraumatic.     Nose: Nose normal.     Mouth/Throat:     Mouth: Mucous membranes are moist.  Eyes:     General: No scleral icterus.    Extraocular Movements: Extraocular movements intact.  Cardiovascular:     Rate and Rhythm: Normal rate and regular rhythm.     Pulses: Normal pulses.     Heart sounds: Normal heart sounds. No murmur heard. Pulmonary:     Breath sounds: Normal breath sounds. No wheezing or rales.  Abdominal:     Palpations: Abdomen is soft.     Tenderness: There is no abdominal tenderness.  Musculoskeletal:        General:  Swelling (B/l knee, mild) present.     Cervical back: Neck supple. No tenderness.     Right lower leg: No edema.     Left lower leg: No edema.     Comments: Cystic structure noted over left hand  Skin:    General: Skin is warm.     Findings: No rash.  Neurological:     General: No focal deficit present.     Mental Status: He is alert and oriented to person, place, and time.     Cranial Nerves: No cranial nerve deficit.     Sensory: No sensory deficit.     Motor: No weakness.  Psychiatric:        Mood and Affect: Mood normal.        Behavior: Behavior normal.     BP 126/81   Pulse 81   Ht 6\' 1"  (1.854 m)   Wt 271 lb (122.9 kg)   SpO2 98%   BMI 35.75 kg/m  Wt Readings from Last 3 Encounters:  09/01/22 271 lb (122.9 kg)  08/18/22 273 lb 0.6 oz (123.9 kg)  07/21/22 280 lb 6.4 oz (127.2 kg)    Lab Results  Component Value Date   TSH 0.607 03/09/2022   Lab Results  Component Value Date   WBC 6.7 07/12/2022   HGB 17.2 07/12/2022   HCT 50.3 07/12/2022   MCV 86 07/12/2022   PLT 266 07/12/2022   Lab Results  Component Value Date   NA 141 03/09/2022   K 4.8 03/09/2022   CO2 24 03/09/2022   GLUCOSE 125 (H) 03/09/2022   BUN 17 03/09/2022   CREATININE 1.10 03/09/2022   BILITOT 0.4 03/09/2022   ALKPHOS 76 03/09/2022   AST 22 03/09/2022   ALT 36 03/09/2022   PROT 7.3 03/09/2022   ALBUMIN 4.7 03/09/2022   CALCIUM 9.7 03/09/2022   EGFR  80 03/09/2022   Lab Results  Component Value Date   CHOL 137 03/09/2022   Lab Results  Component Value Date   HDL 56 03/09/2022   Lab Results  Component Value Date   LDLCALC 66 03/09/2022   Lab Results  Component Value Date   TRIG 74 03/09/2022   Lab Results  Component Value Date   CHOLHDL 2.4 03/09/2022   Lab Results  Component Value Date   HGBA1C 6.2 (H) 07/12/2022      Assessment & Plan:   Problem List Items Addressed This Visit       Cardiovascular and Mediastinum   HTN (hypertension)    BP Readings from  Last 1 Encounters:  09/01/22 126/81  Well-controlled with Verapamil 240 mg QD and Bystolic considering recent PSVT Allergic to ACEi/ARB Counseled for compliance with the medications Advised DASH diet and moderate exercise/walking, at least 150 mins/week      Relevant Medications   nebivolol (BYSTOLIC) 2.5 MG tablet   PSVT (paroxysmal supraventricular tachycardia)    [facility]) Planned to get Holter monitor and see cardiologist [facility]      Relevant Medications   nebivolol (BYSTOLIC) 2.5 MG tablet     Endocrine   Type 2 diabetes mellitus with other specified complication (HCC) - Primary    Lab Results  Component Value Date   HGBA1C 6.2 (H) 07/12/2022  Associated with HTN, OSA and HLD Well controlled On Metformin and Ozempic Advised to follow diabetic diet Reviewed CMP and lipid panel Diabetic eye exam: Advised to follow up with Ophthalmology for diabetic eye exam      Relevant Orders   Microalbumin / creatinine urine ratio     Other   Morbid obesity (HCC)    BMI Readings from Last 3 Encounters:  09/01/22 35.75 kg/m  08/18/22 36.02 kg/m  07/21/22 36.99 kg/m  Associated with HTN, type 2 DM, HLD and OSA Diet modification and moderate exercise advised On GLP-1 agonist for type II DM       Meds ordered this encounter  Medications   nebivolol (BYSTOLIC) 2.5 MG tablet    Sig: Take 1 tablet (2.5 mg total) by mouth daily.    Dispense:  90 tablet    Refill:  1    Follow-up: Return in about 4 months (around 01/02/2023) for HTN and DM.    Anabel Halon, MD

## 2022-09-01 NOTE — Assessment & Plan Note (Signed)
BP Readings from Last 1 Encounters:  09/01/22 126/81   Well-controlled with Verapamil 240 mg QD and Bystolic considering recent PSVT Allergic to ACEi/ARB Counseled for compliance with the medications Advised DASH diet and moderate exercise/walking, at least 150 mins/week

## 2022-09-01 NOTE — Assessment & Plan Note (Signed)
Lab Results  Component Value Date   HGBA1C 6.2 (H) 07/12/2022   Associated with HTN, OSA and HLD Well controlled On Metformin and Ozempic Advised to follow diabetic diet Reviewed CMP and lipid panel Diabetic eye exam: Advised to follow up with Ophthalmology for diabetic eye exam 

## 2022-09-01 NOTE — Assessment & Plan Note (Signed)
Noted on [facility] ([facility]) Planned to get Holter monitor and [facility] 

## 2022-09-01 NOTE — Patient Instructions (Signed)
Please continue to take medications as prescribed. ? ?Please continue to follow low carb diet and perform moderate exercise/walking at least 150 mins/week. ?

## 2022-09-03 LAB — MICROALBUMIN / CREATININE URINE RATIO
Creatinine, Urine: 112.1 mg/dL
Microalb/Creat Ratio: <3 mg/g creat (ref 0–29)
Microalbumin, Urine: <3 ug/mL

## 2022-09-29 DIAGNOSIS — M47812 Spondylosis without myelopathy or radiculopathy, cervical region: Secondary | ICD-10-CM | POA: Diagnosis not present

## 2022-09-29 DIAGNOSIS — M5412 Radiculopathy, cervical region: Secondary | ICD-10-CM | POA: Diagnosis not present

## 2022-09-29 DIAGNOSIS — M542 Cervicalgia: Secondary | ICD-10-CM | POA: Diagnosis not present

## 2022-09-29 DIAGNOSIS — M25511 Pain in right shoulder: Secondary | ICD-10-CM | POA: Diagnosis not present

## 2022-09-29 DIAGNOSIS — M9902 Segmental and somatic dysfunction of thoracic region: Secondary | ICD-10-CM | POA: Diagnosis not present

## 2022-09-29 DIAGNOSIS — M546 Pain in thoracic spine: Secondary | ICD-10-CM | POA: Diagnosis not present

## 2022-09-29 DIAGNOSIS — M9901 Segmental and somatic dysfunction of cervical region: Secondary | ICD-10-CM | POA: Diagnosis not present

## 2022-10-08 DIAGNOSIS — M47812 Spondylosis without myelopathy or radiculopathy, cervical region: Secondary | ICD-10-CM | POA: Diagnosis not present

## 2022-10-08 DIAGNOSIS — M9901 Segmental and somatic dysfunction of cervical region: Secondary | ICD-10-CM | POA: Diagnosis not present

## 2022-10-08 DIAGNOSIS — M546 Pain in thoracic spine: Secondary | ICD-10-CM | POA: Diagnosis not present

## 2022-10-08 DIAGNOSIS — M5451 Vertebrogenic low back pain: Secondary | ICD-10-CM | POA: Diagnosis not present

## 2022-10-08 DIAGNOSIS — M5416 Radiculopathy, lumbar region: Secondary | ICD-10-CM | POA: Diagnosis not present

## 2022-10-08 DIAGNOSIS — M9902 Segmental and somatic dysfunction of thoracic region: Secondary | ICD-10-CM | POA: Diagnosis not present

## 2022-10-08 DIAGNOSIS — M5412 Radiculopathy, cervical region: Secondary | ICD-10-CM | POA: Diagnosis not present

## 2022-10-13 DIAGNOSIS — M47812 Spondylosis without myelopathy or radiculopathy, cervical region: Secondary | ICD-10-CM | POA: Diagnosis not present

## 2022-10-13 DIAGNOSIS — M545 Low back pain, unspecified: Secondary | ICD-10-CM | POA: Diagnosis not present

## 2022-10-15 DIAGNOSIS — M9901 Segmental and somatic dysfunction of cervical region: Secondary | ICD-10-CM | POA: Diagnosis not present

## 2022-10-15 DIAGNOSIS — M5412 Radiculopathy, cervical region: Secondary | ICD-10-CM | POA: Diagnosis not present

## 2022-10-15 DIAGNOSIS — M9902 Segmental and somatic dysfunction of thoracic region: Secondary | ICD-10-CM | POA: Diagnosis not present

## 2022-10-15 DIAGNOSIS — M546 Pain in thoracic spine: Secondary | ICD-10-CM | POA: Diagnosis not present

## 2022-10-18 DIAGNOSIS — M47812 Spondylosis without myelopathy or radiculopathy, cervical region: Secondary | ICD-10-CM | POA: Diagnosis not present

## 2022-10-18 DIAGNOSIS — M545 Low back pain, unspecified: Secondary | ICD-10-CM | POA: Diagnosis not present

## 2022-10-27 DIAGNOSIS — M545 Low back pain, unspecified: Secondary | ICD-10-CM | POA: Diagnosis not present

## 2022-10-27 DIAGNOSIS — M47812 Spondylosis without myelopathy or radiculopathy, cervical region: Secondary | ICD-10-CM | POA: Diagnosis not present

## 2022-10-28 DIAGNOSIS — M4722 Other spondylosis with radiculopathy, cervical region: Secondary | ICD-10-CM | POA: Diagnosis not present

## 2022-10-28 DIAGNOSIS — M5451 Vertebrogenic low back pain: Secondary | ICD-10-CM | POA: Diagnosis not present

## 2022-11-10 DIAGNOSIS — M5459 Other low back pain: Secondary | ICD-10-CM | POA: Diagnosis not present

## 2022-11-10 DIAGNOSIS — M47812 Spondylosis without myelopathy or radiculopathy, cervical region: Secondary | ICD-10-CM | POA: Diagnosis not present

## 2022-11-15 DIAGNOSIS — M5459 Other low back pain: Secondary | ICD-10-CM | POA: Diagnosis not present

## 2022-11-25 DIAGNOSIS — M5451 Vertebrogenic low back pain: Secondary | ICD-10-CM | POA: Diagnosis not present

## 2022-11-25 DIAGNOSIS — M5412 Radiculopathy, cervical region: Secondary | ICD-10-CM | POA: Diagnosis not present

## 2022-11-25 DIAGNOSIS — M5416 Radiculopathy, lumbar region: Secondary | ICD-10-CM | POA: Diagnosis not present

## 2022-11-25 DIAGNOSIS — M47812 Spondylosis without myelopathy or radiculopathy, cervical region: Secondary | ICD-10-CM | POA: Diagnosis not present

## 2022-12-08 DIAGNOSIS — M5416 Radiculopathy, lumbar region: Secondary | ICD-10-CM | POA: Diagnosis not present

## 2022-12-22 ENCOUNTER — Other Ambulatory Visit: Payer: Self-pay | Admitting: Internal Medicine

## 2022-12-22 DIAGNOSIS — E1169 Type 2 diabetes mellitus with other specified complication: Secondary | ICD-10-CM

## 2022-12-23 ENCOUNTER — Other Ambulatory Visit: Payer: Self-pay | Admitting: Internal Medicine

## 2022-12-23 DIAGNOSIS — E1169 Type 2 diabetes mellitus with other specified complication: Secondary | ICD-10-CM

## 2022-12-23 DIAGNOSIS — M5412 Radiculopathy, cervical region: Secondary | ICD-10-CM | POA: Diagnosis not present

## 2022-12-29 ENCOUNTER — Ambulatory Visit: Payer: BC Managed Care – PPO | Admitting: Family Medicine

## 2022-12-29 ENCOUNTER — Encounter: Payer: Self-pay | Admitting: Family Medicine

## 2022-12-29 VITALS — BP 118/82 | HR 92 | Ht 73.0 in | Wt 275.1 lb

## 2022-12-29 DIAGNOSIS — J0110 Acute frontal sinusitis, unspecified: Secondary | ICD-10-CM

## 2022-12-29 DIAGNOSIS — R0989 Other specified symptoms and signs involving the circulatory and respiratory systems: Secondary | ICD-10-CM | POA: Diagnosis not present

## 2022-12-29 DIAGNOSIS — J029 Acute pharyngitis, unspecified: Secondary | ICD-10-CM

## 2022-12-29 MED ORDER — AZELASTINE-FLUTICASONE 137-50 MCG/ACT NA SUSP: 1.0000 | Freq: Two times a day (BID) | NASAL | 1 refills | Status: DC

## 2022-12-29 MED ORDER — PREDNISONE 20 MG PO TABS: 20.0000 mg | ORAL_TABLET | Freq: Two times a day (BID) | ORAL | 0 refills | Status: AC

## 2022-12-29 MED ORDER — BENZONATATE 100 MG PO CAPS: 100.0000 mg | ORAL_CAPSULE | Freq: Two times a day (BID) | ORAL | 0 refills | Status: DC | PRN

## 2022-12-29 NOTE — Patient Instructions (Signed)

## 2022-12-29 NOTE — Assessment & Plan Note (Signed)
Prednisone 20 twice daily x 5 days, benzonatae 100 mg PRN, Azelastine 137-50 nasal spray. Rapid strep, Covid, Flu, RSV labs ordered Discussed symptomatic treatment, rest, increase oral fluid intake. Take OTC tylenol for pain  Follow-up for worsening or persistent symptoms. Patient verbalizes understanding regarding plan of care and all questions answered

## 2022-12-29 NOTE — Progress Notes (Signed)
Patient Office Visit   Subjective   Patient ID: Adam Hahn, male    DOB: 11/24/1965  Age: 57 y.o. MRN: [id number]  CC:  Chief Complaint  Patient presents with   Sinus Problem    Pt reports sx of head congestion ongoing since Sunday, slight cough also.     HPI Adam Hahn 57 year old male,  presents to the clinic for head congestion starting on Sunday and now worsening.  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  Patient complains of cough.Patient describes symptoms of cough,  sinus pressure running nose headache, and sore throat. Patient denise facial pain. Symptoms began 4 days ago and are gradually worsening since that time. Patient denies chest pain. Treatment thus far includes OTC mucinex: somewhat effective Past pulmonary history is significant for no history of pneumonia or bronchitis denies facial pain    Outpatient Encounter Medications as of 12/29/2022  Medication Sig   Azelastine-Fluticasone 137-50 MCG/ACT SUSP Place 1 spray into the nose every 12 (twelve) hours.   benzonatate (TESSALON) 100 MG capsule Take 1 capsule (100 mg total) by mouth 2 (two) times daily as needed for cough.   cetirizine (ZYRTEC ALLERGY) 10 MG tablet Take 1 tablet (10 mg total) by mouth daily.   Cholecalciferol 50 MCG (2000 UT) TABS TAKE ONE TABLET BY MOUTH EVERY DAY THIS REPLACES ERGOCALCIFEROL FOR TREATMENT OF VITAMIN D DEF   cholestyramine (QUESTRAN) 4 g packet Take 1 packet (4 g total) by mouth 2 (two) times daily.   gabapentin (NEURONTIN) 100 MG capsule Take 2 capsules by mouth 2 (two) times daily.   metFORMIN (GLUCOPHAGE-XR) 500 MG 24 hr tablet Take 1  tablet (500 mg total) by mouth daily with breakfast.   nebivolol (BYSTOLIC) 2.5 MG tablet Take 1 tablet (2.5 mg total) by mouth daily.   OZEMPIC, 2 MG/DOSE, 8 MG/3ML SOPN INJECT 2MG  SUBCUTANEOUSLY ONCE PER WEEK   predniSONE (DELTASONE) 20 MG tablet Take 1 tablet (20 mg total) by mouth 2 (two) times daily with a meal for 5 days.   rosuvastatin (CRESTOR) 10 MG tablet Take 10 mg by mouth daily.   sildenafil (VIAGRA) 100 MG tablet Take 100 mg by mouth daily as needed.   Testosterone 20.25 MG/ACT (1.62%) GEL Place 40.5 mg onto the skin daily.   verapamil (CALAN-SR) 240 MG CR tablet Take 1 tablet (240 mg total) by mouth daily.   [DISCONTINUED] azelastine (ASTELIN) 0.1 % nasal spray Place 2 sprays into both nostrils 2 (two) times daily. Use in each nostril as directed   No facility-administered encounter medications on file as of 12/29/2022.    Past Surgical History:  Procedure Laterality Date   COLONOSCOPY WITH PROPOFOL N/A 08/13/2020   Procedure: COLONOSCOPY WITH PROPOFOL;  Surgeon: Dolores Frame, MD;  [facility]: [facility];  Service: Gastroenterology;  Laterality: N/A;  am   KNEE SURGERY Left x4  last one 1996   LAPAROSCOPIC CHOLECYSTECTOMY  09-20-2007  @[facility]    POLYPECTOMY  08/13/2020   Procedure: POLYPECTOMY;  Surgeon: Dolores Frame, MD;  Location: [facility];  Service: Gastroenterology;;   SHOULDER ARTHROSCOPY WITH ROTATOR CUFF REPAIR Right 10/17/2018   Procedure: Right shoulder arthroscopy with subacromial decompression, distal clavicle resection,  rotator cuff DEBRIEDMENT, LABRIAL DEBRIDMENT;  Surgeon: Francena Hanly, MD;  Location: [facility];  Service: Orthopedics;  Laterality: Right;     Review of Systems  Constitutional:  Negative for chills and fever.  HENT:  Positive for congestion and sore throat.   Eyes:  Negative for blurred vision.  Respiratory:  Negative for shortness of breath.   Cardiovascular:  Negative for chest pain.   Gastrointestinal:  Negative for abdominal pain.  Skin:  Negative for rash.  Neurological:  Positive for headaches. Negative for dizziness.      Objective    BP 118/82 (BP Location: Left Arm)   Pulse 92   Ht 6\' 1"  (1.854 m)   Wt 275 lb 1.9 oz (124.8 kg)   SpO2 96%   BMI 36.30 kg/m   Physical Exam Vitals reviewed.  Constitutional:      General: He is not in acute distress.    Appearance: Normal appearance. He is not ill-appearing, toxic-appearing or diaphoretic.  HENT:     Head: Normocephalic.     Nose: Congestion present.     Mouth/Throat:     Pharynx: Posterior oropharyngeal erythema present.  Eyes:     General:        Right eye: No discharge.        Left eye: No discharge.     Conjunctiva/sclera: Conjunctivae normal.  Cardiovascular:     Rate and Rhythm: Normal rate.     Pulses: Normal pulses.     Heart sounds: Normal heart sounds.  Pulmonary:     Effort: Pulmonary effort is normal. No respiratory distress.     Breath sounds: Normal breath sounds.  Abdominal:     Tenderness: There is no abdominal tenderness. There is no guarding.  Musculoskeletal:        General: Normal range of motion.     Cervical back: Normal range of motion and neck supple.  Skin:    General: Skin is warm and dry.     Capillary Refill: Capillary refill takes less than 2 seconds.  Neurological:     General: No focal deficit present.     Mental Status: He is alert.     Coordination: Coordination normal.     Gait: Gait normal.  Psychiatric:        Mood and Affect: Mood normal.        Behavior: Behavior normal.       Assessment & Plan:  Sinus symptom -     COVID-19, Flu A+B and RSV  Sore throat -     Rapid Strep Screen ([facility] ONLY)  Acute non-recurrent frontal sinusitis Assessment & Plan: Prednisone 20 twice daily x 5 days, benzonatae 100 mg PRN, Azelastine 137-50 nasal spray. Rapid strep, Covid, Flu, RSV labs ordered Discussed symptomatic treatment, rest, increase oral  fluid intake. Take OTC tylenol for pain  Follow-up for worsening or persistent symptoms. Patient verbalizes understanding regarding plan of care and all questions answered     Other orders -     Benzonatate; Take 1 capsule (100 mg total) by mouth 2 (two) times daily as needed for cough.  Dispense: 20 capsule; Refill: 0 -     predniSONE; Take 1 tablet (20 mg total) by mouth 2 (two) times daily with a meal for 5 days.  Dispense: 10 tablet; Refill: 0 -     Azelastine-Fluticasone; Place 1 spray into the nose every 12 (twelve) hours.  Dispense: 23 g; Refill: 1    Return  if symptoms worsen or fail to improve.   Cruzita Lederer Newman Nip, FNP

## 2022-12-30 LAB — RAPID STREP SCREEN (MED CTR MEBANE ONLY): Strep Gp A Ag, IA W/Reflex: NEGATIVE

## 2022-12-30 LAB — CULTURE, GROUP A STREP

## 2022-12-31 DIAGNOSIS — M5416 Radiculopathy, lumbar region: Secondary | ICD-10-CM | POA: Diagnosis not present

## 2022-12-31 DIAGNOSIS — M5451 Vertebrogenic low back pain: Secondary | ICD-10-CM | POA: Diagnosis not present

## 2022-12-31 DIAGNOSIS — M47812 Spondylosis without myelopathy or radiculopathy, cervical region: Secondary | ICD-10-CM | POA: Diagnosis not present

## 2022-12-31 DIAGNOSIS — M5412 Radiculopathy, cervical region: Secondary | ICD-10-CM | POA: Diagnosis not present

## 2022-12-31 LAB — COVID-19, FLU A+B AND RSV
Influenza A, NAA: NOT DETECTED
Influenza B, NAA: NOT DETECTED
RSV, NAA: NOT DETECTED
SARS-CoV-2, NAA: DETECTED — AB

## 2022-12-31 LAB — SPECIMEN STATUS REPORT

## 2023-01-05 ENCOUNTER — Encounter: Payer: Self-pay | Admitting: Internal Medicine

## 2023-01-05 ENCOUNTER — Ambulatory Visit: Payer: BC Managed Care – PPO | Admitting: Internal Medicine

## 2023-01-05 VITALS — BP 130/87 | HR 93 | Ht 73.0 in | Wt 276.0 lb

## 2023-01-05 DIAGNOSIS — E1169 Type 2 diabetes mellitus with other specified complication: Secondary | ICD-10-CM

## 2023-01-05 DIAGNOSIS — Z23 Encounter for immunization: Secondary | ICD-10-CM

## 2023-01-05 DIAGNOSIS — M5030 Other cervical disc degeneration, unspecified cervical region: Secondary | ICD-10-CM | POA: Insufficient documentation

## 2023-01-05 DIAGNOSIS — E291 Testicular hypofunction: Secondary | ICD-10-CM

## 2023-01-05 DIAGNOSIS — Z7984 Long term (current) use of oral hypoglycemic drugs: Secondary | ICD-10-CM

## 2023-01-05 DIAGNOSIS — I10 Essential (primary) hypertension: Secondary | ICD-10-CM | POA: Diagnosis not present

## 2023-01-05 LAB — HEMOGLOBIN A1C: Hemoglobin A1C: 6.2

## 2023-01-05 MED ORDER — OZEMPIC (2 MG/DOSE) 8 MG/3ML ~~LOC~~ SOPN: 2.0000 mg | PEN_INJECTOR | SUBCUTANEOUS | 5 refills | Status: DC

## 2023-01-05 NOTE — Assessment & Plan Note (Signed)
Lab Results  Component Value Date   HGBA1C 6.2 (H) 07/12/2022   Associated with HTN, OSA and HLD Well controlled On Metformin and Ozempic 2 mg qw Advised to follow diabetic diet Reviewed CMP and lipid panel Diabetic eye exam: Advised to follow up with Ophthalmology for diabetic eye exam

## 2023-01-05 NOTE — Assessment & Plan Note (Addendum)
Has disc herniation at C3-C4 with central foraminal stenosis Also has foraminal stenosis at C5-C6 Followed by [facility] - visit note reviewed

## 2023-01-05 NOTE — Assessment & Plan Note (Addendum)
Has erectile dysfunction despite taking Viagra Checked testosterone profile - has low testosterone, but [facility] prefers to check estradiol level as well before starting testosterone supplement

## 2023-01-05 NOTE — Progress Notes (Signed)
Established Patient Office Visit  Subjective:  Patient ID: Adam Hahn, male    DOB: Sep 02, 1965  Age: 57 y.o. MRN: [id number]  CC:  Chief Complaint  Patient presents with   Hypertension    Four month follow up    Diabetes    Four month follow up     HPI Adam Hahn is a 57 y.o. male with past medical history of HTN, DM and OA who presents for f/u of his chronic medical conditions.  HTN: BP is wnl now. Takes medications regularly. Patient denies headache, dizziness, chest pain, dyspnea or palpitations.  Type II DM with HLD: His HbA1c was 6.2 in 03/24.  He has been taking metformin and Ozempic currently.  He also takes Crestor for HLD.  His blood glucose ranges around 100 at home.  He currently denies any polyuria, polydipsia or polyphagia.  He has has been gaining weight due to lack of physical activity and due to constant back and knee pain.  He uses CPAP regularly for OSA.  He recently had sleep study at [place], and was found to have episode of SVT overnight.  He had cardiac monitor and saw cardiologist at [facility], but had sinus tachycardia.  He reports erectile dysfunction, he takes sildenafil 100 mg as needed.  He had started using testosterone gel for testosterone deficiency and his testosterone level had slightly improved in 03/24.  But he was told to stop testosterone by his [place] urologist as they prefer to check estradiol level as well.  Denies any dysuria, hematuria, urethral discharge, urinary hesitancy or resistance currently.  Cervical and and lumbar radiculopathy: He has chronic neck and back pain, radiating to UE and LE.  He has right UE numbness as well.  He has seen Dr Drucie Ip at [facility] and has had MRI of cervical and lumbar spine recently. He was referred to Dr. Shon Baton for pain surgery evaluation recently.   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   OSA on CPAP    S/P lateral meniscectomy of left knee 06/03/2011   Shoulder impingement, right    Type 2 diabetes mellitus (HCC)    followed by pcp   Wears glasses     Past Surgical History:  Procedure Laterality Date   COLONOSCOPY WITH PROPOFOL N/A 08/13/2020   Procedure: COLONOSCOPY WITH PROPOFOL;  Surgeon: Dolores Frame, MD;  [facility];  Service: Gastroenterology;  Laterality: N/A;  am   KNEE SURGERY Left x4  last one 1996   LAPAROSCOPIC CHOLECYSTECTOMY  09-20-2007  @[facility]    POLYPECTOMY  08/13/2020   Procedure: POLYPECTOMY;  Surgeon: Dolores Frame, MD;  [facility];  Service: Gastroenterology;;   SHOULDER ARTHROSCOPY WITH ROTATOR CUFF REPAIR Right 10/17/2018   Procedure: Right shoulder arthroscopy with subacromial decompression, distal clavicle resection,  rotator cuff DEBRIEDMENT, LABRIAL DEBRIDMENT;  Surgeon: Francena Hanly, MD;  Location: [facility];  Service: Orthopedics;  Laterality: Right;     Family History  Problem Relation Age of Onset   Hypertension Father    Asthma Mother    Allergic rhinitis Neg Hx    Angioedema Neg Hx    Atopy Neg Hx    Eczema  Neg Hx    Immunodeficiency Neg Hx    Urticaria Neg Hx     Social History   Socioeconomic History   Marital status: Married    Spouse name: Not on file   Number of children: Not on file   Years of education: Not on file   Highest education level: Bachelor's degree (e.g., BA, AB, BS)  Occupational History   Not on file  Tobacco Use   Smoking status: Never   Smokeless tobacco: Never  Vaping Use   Vaping status: Never Used  Substance and Sexual Activity   Alcohol use: Yes    Comment: occasionally   Drug use: Never   Sexual activity: Yes  Other Topics Concern   Not on file  Social History Narrative   Not on  file   Social Determinants of Health   Financial Resource Strain: Not on file  Food Insecurity: No Food Insecurity (08/31/2022)   Hunger Vital Sign    Worried About Running Out of Food in the Last Year: Never true    Ran Out of Food in the Last Year: Never true  Transportation Needs: No Transportation Needs (08/31/2022)   PRAPARE - Administrator, Civil Service (Medical): No    Lack of Transportation (Non-Medical): No  Physical Activity: Sufficiently Active (08/31/2022)   Exercise Vital Sign    Days of Exercise per Week: 5 days    Minutes of Exercise per Session: 40 min  Stress: No Stress Concern Present (08/31/2022)   [employer] of Occupational Health - Occupational Stress Questionnaire    Feeling of Stress : Not at all  Social Connections: Moderately Integrated (08/31/2022)   Social Connection and Isolation Panel [NHANES]    Frequency of Communication with Friends and Family: More than three times a week    Frequency of Social Gatherings with Friends and Family: Once a week    Attends Religious Services: More than 4 times per year    Active Member of [employer] or Organizations: No    Attends Engineer, structural: Not on file    Marital Status: Married  Intimate Partner Violence: Unknown (08/07/2021)   Received from [employer], [facility]   HITS    Physically Hurt: Not on file    Insult or Talk Down To: Not on file    Threaten Physical Harm: Not on file    Scream or Curse: Not on file    Outpatient Medications Prior to Visit  Medication Sig Dispense Refill   Azelastine-Fluticasone 137-50 MCG/ACT SUSP Place 1 spray into the nose every 12 (twelve) hours. 23 g 1   benzonatate (TESSALON) 100 MG capsule Take 1 capsule (100 mg total) by mouth 2 (two) times daily as needed for cough. 20 capsule 0   cetirizine (ZYRTEC ALLERGY) 10 MG tablet Take 1 tablet (10 mg total) by mouth daily. 30 tablet 0   Cholecalciferol 50 MCG (2000 UT) TABS TAKE ONE TABLET BY MOUTH  EVERY DAY THIS REPLACES ERGOCALCIFEROL FOR TREATMENT OF VITAMIN D DEF     cholestyramine (QUESTRAN) 4 g packet Take 1 packet (4 g total) by mouth 2 (two) times daily. 60 each 5   gabapentin (NEURONTIN) 100 MG capsule Take 2 capsules by mouth 2 (two) times daily.     metFORMIN (GLUCOPHAGE-XR) 500 MG 24 hr tablet Take 1 tablet (500 mg total) by mouth daily with breakfast. 30 tablet 5   nebivolol (BYSTOLIC) 2.5 MG tablet Take 1 tablet (2.5 mg total) by mouth daily.  90 tablet 1   rosuvastatin (CRESTOR) 10 MG tablet Take 10 mg by mouth daily.     sildenafil (VIAGRA) 100 MG tablet Take 100 mg by mouth daily as needed.     Testosterone 20.25 MG/ACT (1.62%) GEL Place 40.5 mg onto the skin daily. 75 g 2   verapamil (CALAN-SR) 240 MG CR tablet Take 1 tablet (240 mg total) by mouth daily. 30 tablet 3   OZEMPIC, 2 MG/DOSE, 8 MG/3ML SOPN INJECT 2MG  SUBCUTANEOUSLY ONCE PER WEEK 3 mL 0   No facility-administered medications prior to visit.    Allergies  Allergen Reactions   Indomethacin Other (See Comments)   Lotrel [Amlodipine Besy-Benazepril Hcl] Other (See Comments)    lethargic   Tomato Hives   Ace Inhibitors Rash   Hydrocodone Hives and Rash   Lisinopril Rash   Losartan Potassium Rash   Valsartan Rash    ROS Review of Systems  Constitutional:  Negative for chills and fever.  HENT:  Negative for congestion and sore throat.   Eyes:  Negative for pain and discharge.  Respiratory:  Negative for cough and shortness of breath.   Cardiovascular:  Negative for chest pain and palpitations.  Gastrointestinal:  Negative for diarrhea, nausea and vomiting.  Endocrine: Negative for polydipsia and polyuria.  Genitourinary:  Negative for dysuria and hematuria.  Musculoskeletal:  Positive for arthralgias and neck pain. Negative for neck stiffness.  Skin:  Negative for rash.  Neurological:  Positive for numbness (R arm). Negative for dizziness, weakness and headaches.  Psychiatric/Behavioral:  Negative  for agitation and behavioral problems.       Objective:    Physical Exam Vitals reviewed.  Constitutional:      General: He is not in acute distress.    Appearance: He is obese. He is not diaphoretic.  HENT:     Head: Normocephalic and atraumatic.     Nose: Nose normal.     Mouth/Throat:     Mouth: Mucous membranes are moist.  Eyes:     General: No scleral icterus.    Extraocular Movements: Extraocular movements intact.  Cardiovascular:     Rate and Rhythm: Normal rate and regular rhythm.     Pulses: Normal pulses.     Heart sounds: Normal heart sounds. No murmur heard. Pulmonary:     Breath sounds: Normal breath sounds. No wheezing or rales.  Musculoskeletal:        General: Swelling (B/l knee, mild) present.     Cervical back: Neck supple. Tenderness present.     Right lower leg: No edema.     Left lower leg: No edema.     Comments: Cystic structure noted over left hand  Skin:    General: Skin is warm.     Findings: No rash.  Neurological:     General: No focal deficit present.     Mental Status: He is alert and oriented to person, place, and time.     Cranial Nerves: No cranial nerve deficit.     Sensory: No sensory deficit.     Motor: No weakness.  Psychiatric:        Mood and Affect: Mood normal.        Behavior: Behavior normal.     BP 130/87 (BP Location: Right Arm, Patient Position: Sitting, Cuff Size: Large)   Pulse 93   Ht 6\' 1"  (1.854 m)   Wt 276 lb (125.2 kg)   SpO2 96%   BMI 36.41 kg/m  Wt Readings from Last  3 Encounters:  01/05/23 276 lb (125.2 kg)  12/29/22 275 lb 1.9 oz (124.8 kg)  09/01/22 271 lb (122.9 kg)    Lab Results  Component Value Date   TSH 0.607 03/09/2022   Lab Results  Component Value Date   WBC 6.7 07/12/2022   HGB 17.2 07/12/2022   HCT 50.3 07/12/2022   MCV 86 07/12/2022   PLT 266 07/12/2022   Lab Results  Component Value Date   NA 141 03/09/2022   K 4.8 03/09/2022   CO2 24 03/09/2022   GLUCOSE 125 (H)  03/09/2022   BUN 17 03/09/2022   CREATININE 1.10 03/09/2022   BILITOT 0.4 03/09/2022   ALKPHOS 76 03/09/2022   AST 22 03/09/2022   ALT 36 03/09/2022   PROT 7.3 03/09/2022   ALBUMIN 4.7 03/09/2022   CALCIUM 9.7 03/09/2022   EGFR 80 03/09/2022   Lab Results  Component Value Date   CHOL 137 03/09/2022   Lab Results  Component Value Date   HDL 56 03/09/2022   Lab Results  Component Value Date   LDLCALC 66 03/09/2022   Lab Results  Component Value Date   TRIG 74 03/09/2022   Lab Results  Component Value Date   CHOLHDL 2.4 03/09/2022   Lab Results  Component Value Date   HGBA1C 6.2 01/05/2023      Assessment & Plan:   Problem List Items Addressed This Visit       Cardiovascular and Mediastinum   HTN (hypertension)    BP Readings from Last 1 Encounters:  01/05/23 130/87   Well-controlled with Verapamil 240 mg QD and Bystolic considering recent PSVT Allergic to ACEi/ARB Counseled for compliance with the medications Advised DASH diet and moderate exercise/walking, at least 150 mins/week        Endocrine   Type 2 diabetes mellitus with other specified complication (HCC) - Primary    Lab Results  Component Value Date   HGBA1C 6.2 (H) 07/12/2022   Associated with HTN, OSA and HLD Well controlled On Metformin and Ozempic 2 mg qw Advised to follow diabetic diet Reviewed CMP and lipid panel Diabetic eye exam: Advised to follow up with Ophthalmology for diabetic eye exam      Relevant Medications   Semaglutide, 2 MG/DOSE, (OZEMPIC, 2 MG/DOSE,) 8 MG/3ML SOPN   Other Relevant Orders   Bayer DCA Hb A1c Waived   Male hypogonadism    Has erectile dysfunction despite taking Viagra Checked testosterone profile - has low testosterone, but [facility] prefers to check estradiol level as well before starting testosterone supplement        Musculoskeletal and Integument   DDD (degenerative disc disease), cervical    Has disc herniation at C3-C4 with central  foraminal stenosis Also has foraminal stenosis at C5-C6 Followed by [facility] - visit note reviewed      Other Visit Diagnoses     Encounter for immunization       Relevant Orders   Flu vaccine trivalent PF, 6mos and older(Flulaval,Afluria,Fluarix,Fluzone) (Completed)        Meds ordered this encounter  Medications   Semaglutide, 2 MG/DOSE, (OZEMPIC, 2 MG/DOSE,) 8 MG/3ML SOPN    Sig: Inject 2 mg into the skin every 7 (seven) days.    Dispense:  3 mL    Refill:  5    Follow-up: Return in about 4 months (around 05/07/2023) for Annual physical.    Anabel Halon, MD

## 2023-01-05 NOTE — Patient Instructions (Signed)
Please continue to take medications as prescribed. ? ?Please continue to follow low carb diet and perform moderate exercise/walking at least 150 mins/week. ?

## 2023-01-05 NOTE — Assessment & Plan Note (Signed)
BP Readings from Last 1 Encounters:  01/05/23 130/87   Well-controlled with Verapamil 240 mg QD and Bystolic considering recent PSVT Allergic to ACEi/ARB Counseled for compliance with the medications Advised DASH diet and moderate exercise/walking, at least 150 mins/week

## 2023-01-07 LAB — BAYER DCA HB A1C WAIVED: HB A1C (BAYER DCA - WAIVED): 6.2 % — ABNORMAL HIGH (ref 4.8–5.6)

## 2023-01-10 ENCOUNTER — Other Ambulatory Visit: Payer: Self-pay | Admitting: Internal Medicine

## 2023-01-10 DIAGNOSIS — K529 Noninfective gastroenteritis and colitis, unspecified: Secondary | ICD-10-CM

## 2023-01-19 ENCOUNTER — Telehealth: Payer: Self-pay | Admitting: Internal Medicine

## 2023-01-19 DIAGNOSIS — M4712 Other spondylosis with myelopathy, cervical region: Secondary | ICD-10-CM | POA: Diagnosis not present

## 2023-01-19 DIAGNOSIS — G959 Disease of spinal cord, unspecified: Secondary | ICD-10-CM | POA: Diagnosis not present

## 2023-01-19 NOTE — Telephone Encounter (Signed)
Surgical clearance   Noted  Copied Sleeved  Original in PCP box Copy front desk folder

## 2023-02-02 ENCOUNTER — Ambulatory Visit (HOSPITAL_COMMUNITY): Payer: Self-pay | Admitting: Orthopedic Surgery

## 2023-02-03 NOTE — Progress Notes (Signed)
Surgical Instructions   Your procedure is scheduled on Thursday, 02/17/23. Report to [facility] Main Entrance "A" at 5:30 A.M., then check in with the Admitting office. Any questions or running late day of surgery: call [phone]  Questions prior to your surgery date: call [phone], Monday-Friday, 8am-4pm. If you experience any cold or flu symptoms such as cough, fever, chills, shortness of breath, etc. between now and your scheduled surgery, please notify us at the above number.     Remember:  Do not eat after midnight the night before your surgery  You may drink clear liquids until 4:30am the morning of your surgery.   Clear liquids allowed are: Water, Non-Citrus Juices (without pulp), Carbonated Beverages, Clear Tea, Black Coffee Only (NO MILK, CREAM OR POWDERED CREAMER of any kind), and Gatorade.    Take these medicines the morning of surgery with A SIP OF WATER  cetirizine (ZYRTEC ALLERGY)  cholestyramine (QUESTRAN  gabapentin (NEURONTIN)  nebivolol (BYSTOLIC)  rosuvastatin (CRESTOR)  One week prior to surgery, STOP taking any Aspirin (unless otherwise instructed by your surgeon) Aleve, Naproxen, Ibuprofen, Motrin, Advil, Goody's, BC's, all herbal medications, fish oil, and non-prescription vitamins.  WHAT DO I DO ABOUT MY DIABETES MEDICATION?   Do not take oral diabetes medicines (pills) the morning of surgery.  Do not take Semaglutide, 2 MG/DOSE, (OZEMPIC, 2 MG/DOSE,) 7 days prior to surgery. Do not take a dose after 02/09/23.   THE MORNING OF SURGERY, do not take metFORMIN (GLUCOPHAGE-XR).  The day of surgery, do not take other diabetes injectables, including Byetta (exenatide), Bydureon (exenatide ER), Victoza (liraglutide), or Trulicity (dulaglutide).  If your CBG is greater than 220 mg/dL, you may take  of your sliding scale (correction) dose of insulin.   HOW TO MANAGE YOUR DIABETES BEFORE AND AFTER SURGERY  Why is it important to control my blood sugar  before and after surgery? Improving blood sugar levels before and after surgery helps healing and can limit problems. A way of improving blood sugar control is eating a healthy diet by:  Eating less sugar and carbohydrates  Increasing activity/exercise  Talking with your doctor about reaching your blood sugar goals High blood sugars (greater than 180 mg/dL) can raise your risk of infections and slow your recovery, so you will need to focus on controlling your diabetes during the weeks before surgery. Make sure that the doctor who takes care of your diabetes knows about your planned surgery including the date and location.  How do I manage my blood sugar before surgery? Check your blood sugar at least 4 times a day, starting 2 days before surgery, to make sure that the level is not too high or low.  Check your blood sugar the morning of your surgery when you wake up and every 2 hours until you get to the Short Stay unit.  If your blood sugar is less than 70 mg/dL, you will need to treat for low blood sugar: Do not take insulin. Treat a low blood sugar (less than 70 mg/dL) with  cup of clear juice (cranberry or apple), 4 glucose tablets, OR glucose gel. Recheck blood sugar in 15 minutes after treatment (to make sure it is greater than 70 mg/dL). If your blood sugar is not greater than 70 mg/dL on recheck, call [phone] for further instructions. Report your blood sugar to the short stay nurse when you get to Short Stay.  If you are admitted to the hospital after surgery: Your blood sugar will be checked by the  staff and you will probably be given insulin after surgery (instead of oral diabetes medicines) to make sure you have good blood sugar levels. The goal for blood sugar control after surgery is 80-180 mg/dL.                      Do NOT Smoke (Tobacco/Vaping) for 24 hours prior to your procedure.  If you use a CPAP at night, you may bring your mask/headgear for your overnight  stay.   You will be asked to remove any contacts, glasses, piercing's, hearing aid's, dentures/partials prior to surgery. Please bring cases for these items if needed.    Patients discharged the day of surgery will not be allowed to drive home, and someone needs to stay with them for 24 hours.  SURGICAL WAITING ROOM VISITATION Patients may have no more than 2 support people in the waiting area - these visitors may rotate.   Pre-op nurse will coordinate an appropriate time for 1 ADULT support person, who may not rotate, to accompany patient in pre-op.  Children under the age of 50 must have an adult with them who is not the patient and must remain in the main waiting area with an adult.  If the patient needs to stay at the hospital during part of their recovery, the visitor guidelines for inpatient rooms apply.  Please refer to the [facility] website for the visitor guidelines for any additional information.   If you received a COVID test during your pre-op visit  it is requested that you wear a mask when out in public, stay away from anyone that may not be feeling well and notify your surgeon if you develop symptoms. If you have been in contact with anyone that has tested positive in the last 10 days please notify you surgeon.      Pre-operative 5 CHG Bathing Instructions   You can play a key role in reducing the risk of infection after surgery. Your skin needs to be as free of germs as possible. You can reduce the number of germs on your skin by washing with CHG (chlorhexidine gluconate) soap before surgery. CHG is an antiseptic soap that kills germs and continues to kill germs even after washing.   DO NOT use if you have an allergy to chlorhexidine/CHG or antibacterial soaps. If your skin becomes reddened or irritated, stop using the CHG and notify one of our RNs at [phone].   Please shower with the CHG soap starting 4 days before surgery using the following schedule:      Please keep in mind the following:  DO NOT shave, including legs and underarms, starting the day of your first shower.   You may shave your face at any point before/day of surgery.  Place clean sheets on your bed the day you start using CHG soap. Use a clean washcloth (not used since being washed) for each shower. DO NOT sleep with pets once you start using the CHG.   CHG Shower Instructions:  Wash your face and private area with normal soap. If you choose to wash your hair, wash first with your normal shampoo.  After you use shampoo/soap, rinse your hair and body thoroughly to remove shampoo/soap residue.  Turn the water OFF and apply about 3 tablespoons (45 ml) of CHG soap to a CLEAN washcloth.  Apply CHG soap ONLY FROM YOUR NECK DOWN TO YOUR TOES (washing for 3-5 minutes)  DO NOT use CHG soap on face, private  areas, open wounds, or sores.  Pay special attention to the area where your surgery is being performed.  If you are having back surgery, having someone wash your back for you may be helpful. Wait 2 minutes after CHG soap is applied, then you may rinse off the CHG soap.  Pat dry with a clean towel  Put on clean clothes/pajamas   If you choose to wear lotion, please use ONLY the CHG-compatible lotions on the back of this paper.   Additional instructions for the day of surgery: DO NOT APPLY any lotions, deodorants, cologne, or perfumes.   Do not bring valuables to the hospital. [facility] is not responsible for any belongings/valuables. Do not wear nail polish, gel polish, artificial nails, or any other type of covering on natural nails (fingers and toes) Do not wear jewelry or makeup Put on clean/comfortable clothes.  Please brush your teeth.  Ask your nurse before applying any prescription medications to the skin.     CHG Compatible Lotions   Aveeno Moisturizing lotion  Cetaphil Moisturizing Cream  Cetaphil Moisturizing Lotion  Clairol Herbal Essence Moisturizing  Lotion, Dry Skin  Clairol Herbal Essence Moisturizing Lotion, Extra Dry Skin  Clairol Herbal Essence Moisturizing Lotion, Normal Skin  Curel Age Defying Therapeutic Moisturizing Lotion with Alpha Hydroxy  Curel Extreme Care Body Lotion  Curel Soothing Hands Moisturizing Hand Lotion  Curel Therapeutic Moisturizing Cream, Fragrance-Free  Curel Therapeutic Moisturizing Lotion, Fragrance-Free  Curel Therapeutic Moisturizing Lotion, Original Formula  Eucerin Daily Replenishing Lotion  Eucerin Dry Skin Therapy Plus Alpha Hydroxy Crme  Eucerin Dry Skin Therapy Plus Alpha Hydroxy Lotion  Eucerin Original Crme  Eucerin Original Lotion  Eucerin Plus Crme Eucerin Plus Lotion  Eucerin TriLipid Replenishing Lotion  Keri Anti-Bacterial Hand Lotion  Keri Deep Conditioning Original Lotion Dry Skin Formula Softly Scented  Keri Deep Conditioning Original Lotion, Fragrance Free Sensitive Skin Formula  Keri Lotion Fast Absorbing Fragrance Free Sensitive Skin Formula  Keri Lotion Fast Absorbing Softly Scented Dry Skin Formula  Keri Original Lotion  Keri Skin Renewal Lotion Keri Silky Smooth Lotion  Keri Silky Smooth Sensitive Skin Lotion  Nivea Body Creamy Conditioning Oil  Nivea Body Extra Enriched Lotion  Nivea Body Original Lotion  Nivea Body Sheer Moisturizing Lotion Nivea Crme  Nivea Skin Firming Lotion  NutraDerm 30 Skin Lotion  NutraDerm Skin Lotion  NutraDerm Therapeutic Skin Cream  NutraDerm Therapeutic Skin Lotion  ProShield Protective Hand Cream  Provon moisturizing lotion  Please read over the following fact sheets that you were given.

## 2023-02-04 ENCOUNTER — Encounter (HOSPITAL_COMMUNITY)
Admission: RE | Admit: 2023-02-04 | Discharge: 2023-02-04 | Disposition: A | Discharge status: Home / Self Care | Payer: BC Managed Care – PPO | Source: Ambulatory Visit | Attending: Orthopedic Surgery | Admitting: Orthopedic Surgery

## 2023-02-04 ENCOUNTER — Encounter (HOSPITAL_COMMUNITY): Payer: Self-pay

## 2023-02-04 ENCOUNTER — Other Ambulatory Visit: Payer: Self-pay

## 2023-02-04 VITALS — BP 137/87 | HR 95 | Temp 98.7°F | Resp 18 | Ht 73.0 in | Wt 264.7 lb

## 2023-02-04 DIAGNOSIS — Z01818 Encounter for other preprocedural examination: Secondary | ICD-10-CM | POA: Diagnosis not present

## 2023-02-04 DIAGNOSIS — E1169 Type 2 diabetes mellitus with other specified complication: Secondary | ICD-10-CM | POA: Insufficient documentation

## 2023-02-04 LAB — CBC
HCT: 52.6 % — ABNORMAL HIGH (ref 39.0–52.0)
Hemoglobin: 18 g/dL — ABNORMAL HIGH (ref 13.0–17.0)
MCH: 30.3 pg (ref 26.0–34.0)
MCHC: 34.2 g/dL (ref 30.0–36.0)
MCV: 88.4 fL (ref 80.0–100.0)
Platelets: 296 10*3/uL (ref 150–400)
RBC: 5.95 MIL/uL — ABNORMAL HIGH (ref 4.22–5.81)
RDW: 12.3 % (ref 11.5–15.5)
WBC: 11.2 10*3/uL — ABNORMAL HIGH (ref 4.0–10.5)
nRBC: 0 % (ref 0.0–0.2)

## 2023-02-04 LAB — BASIC METABOLIC PANEL
Anion gap: 13 (ref 5–15)
BUN: 19 mg/dL (ref 6–20)
CO2: 22 mmol/L (ref 22–32)
Calcium: 9.8 mg/dL (ref 8.9–10.3)
Chloride: 101 mmol/L (ref 98–111)
Creatinine, Ser: 1.09 mg/dL (ref 0.61–1.24)
GFR, Estimated: >60 mL/min (ref >60)
Glucose, Bld: 103 mg/dL — ABNORMAL HIGH (ref 70–99)
Potassium: 4.6 mmol/L (ref 3.5–5.1)
Sodium: 136 mmol/L (ref 135–145)

## 2023-02-04 LAB — GLUCOSE, CAPILLARY: Glucose-Capillary: 194 mg/dL — ABNORMAL HIGH (ref 70–99)

## 2023-02-04 LAB — SURGICAL PCR SCREEN
MRSA, PCR: NEGATIVE
Staphylococcus aureus: NEGATIVE

## 2023-02-04 NOTE — Progress Notes (Signed)
PCP - rutwik patel Cardiologist - denies  PPM/ICD - denies   Chest x-ray - n/a EKG - 02/04/23 Stress Test - 2003 ECHO - denies Cardiac Cath - denies  Sleep Study - +OSA CPAP - wears nightly  Fasting Blood Sugar - 109 Checks Blood Sugar twice a week  Last dose of GLP1 agonist-  9/29 GLP1 instructions: stop taking after 02/07/2023   ERAS Protcol -yes PRE-SURGERY Ensure or G2- none ordered  COVID TEST- not needed   Anesthesia review: no  Patient denies shortness of breath, fever, cough and chest pain at PAT appointment   All instructions explained to the patient, with a verbal understanding of the material. Patient agrees to go over the instructions while at home for a better understanding. Patient also instructed to self quarantine after being tested for COVID-19. The opportunity to ask questions was provided.

## 2023-02-11 DIAGNOSIS — M5412 Radiculopathy, cervical region: Secondary | ICD-10-CM | POA: Diagnosis not present

## 2023-02-11 DIAGNOSIS — M542 Cervicalgia: Secondary | ICD-10-CM | POA: Diagnosis not present

## 2023-02-16 NOTE — Anesthesia Preprocedure Evaluation (Signed)
Anesthesia Evaluation  Patient identified by MRN, date of birth, ID band Patient awake    Reviewed: Allergy & Precautions, NPO status , Patient's Chart, lab work & pertinent test results, reviewed documented beta blocker date and time   History of Anesthesia Complications Negative for: history of anesthetic complications  Airway Mallampati: II  TM Distance: >3 FB Neck ROM: Full    Dental  (+) Dental Advisory Given   Pulmonary sleep apnea and Continuous Positive Airway Pressure Ventilation    breath sounds clear to auscultation       Cardiovascular hypertension, Pt. on medications and Pt. on home beta blockers (-) angina  Rhythm:Regular Rate:Normal     Neuro/Psych    GI/Hepatic negative GI ROS, Neg liver ROS,,,  Endo/Other  diabetes (glu 134), Oral Hypoglycemic Agents  Semaglutide: last 10/4  Renal/GU negative Renal ROS     Musculoskeletal   Abdominal   Peds  Hematology negative hematology ROS (+)   Anesthesia Other Findings   Reproductive/Obstetrics                             Anesthesia Physical Anesthesia Plan  ASA: 3  Anesthesia Plan: General   Post-op Pain Management: Tylenol PO (pre-op)*   Induction: Intravenous  PONV Risk Score and Plan: 2 and Ondansetron, Dexamethasone and Treatment may vary due to age or medical condition  Airway Management Planned: Video Laryngoscope Planned and Oral ETT  Additional Equipment: None  Intra-op Plan:   Post-operative Plan: Extubation in OR  Informed Consent: I have reviewed the patients History and Physical, chart, labs and discussed the procedure including the risks, benefits and alternatives for the proposed anesthesia with the patient or authorized representative who has indicated his/her understanding and acceptance.     Dental advisory given  Plan Discussed with: CRNA and Surgeon  Anesthesia Plan Comments:          Anesthesia Quick Evaluation

## 2023-02-17 ENCOUNTER — Ambulatory Visit (HOSPITAL_COMMUNITY): Payer: BC Managed Care – PPO | Admitting: Anesthesiology

## 2023-02-17 ENCOUNTER — Ambulatory Visit (HOSPITAL_COMMUNITY): Payer: Self-pay | Admitting: Anesthesiology

## 2023-02-17 ENCOUNTER — Other Ambulatory Visit: Payer: Self-pay

## 2023-02-17 ENCOUNTER — Observation Stay (HOSPITAL_COMMUNITY)
Admission: RE | Admit: 2023-02-17 | Discharge: 2023-02-19 | Disposition: A | Discharge status: Home / Self Care | Payer: BC Managed Care – PPO | Attending: Orthopedic Surgery | Admitting: Orthopedic Surgery

## 2023-02-17 ENCOUNTER — Ambulatory Visit (HOSPITAL_COMMUNITY)
Admission: RE | Disposition: A | Discharge status: Home / Self Care | Payer: Self-pay | Source: Home / Self Care | Attending: Orthopedic Surgery

## 2023-02-17 ENCOUNTER — Ambulatory Visit (HOSPITAL_COMMUNITY): Payer: BC Managed Care – PPO

## 2023-02-17 ENCOUNTER — Encounter (HOSPITAL_COMMUNITY): Payer: Self-pay | Admitting: Orthopedic Surgery

## 2023-02-17 DIAGNOSIS — Z981 Arthrodesis status: Secondary | ICD-10-CM | POA: Diagnosis not present

## 2023-02-17 DIAGNOSIS — M4712 Other spondylosis with myelopathy, cervical region: Principal | ICD-10-CM | POA: Insufficient documentation

## 2023-02-17 DIAGNOSIS — G959 Disease of spinal cord, unspecified: Principal | ICD-10-CM | POA: Diagnosis present

## 2023-02-17 DIAGNOSIS — Z79899 Other long term (current) drug therapy: Secondary | ICD-10-CM | POA: Diagnosis not present

## 2023-02-17 DIAGNOSIS — E119 Type 2 diabetes mellitus without complications: Secondary | ICD-10-CM | POA: Insufficient documentation

## 2023-02-17 DIAGNOSIS — Z7984 Long term (current) use of oral hypoglycemic drugs: Secondary | ICD-10-CM | POA: Diagnosis not present

## 2023-02-17 DIAGNOSIS — I10 Essential (primary) hypertension: Secondary | ICD-10-CM | POA: Insufficient documentation

## 2023-02-17 DIAGNOSIS — E1169 Type 2 diabetes mellitus with other specified complication: Secondary | ICD-10-CM

## 2023-02-17 DIAGNOSIS — M4322 Fusion of spine, cervical region: Secondary | ICD-10-CM | POA: Diagnosis not present

## 2023-02-17 HISTORY — PX: ANTERIOR CERVICAL DECOMP/DISCECTOMY FUSION: SHX1161

## 2023-02-17 LAB — GLUCOSE, CAPILLARY
Glucose-Capillary: 134 mg/dL — ABNORMAL HIGH (ref 70–99)
Glucose-Capillary: 156 mg/dL — ABNORMAL HIGH (ref 70–99)
Glucose-Capillary: 150 mg/dL — ABNORMAL HIGH (ref 70–99)
Glucose-Capillary: 207 mg/dL — ABNORMAL HIGH (ref 70–99)

## 2023-02-17 SURGERY — ANTERIOR CERVICAL DECOMPRESSION/DISCECTOMY FUSION 1 LEVEL
Anesthesia: General

## 2023-02-17 MED ORDER — OXYCODONE HCL 5 MG PO TABS
10.0000 mg | ORAL_TABLET | ORAL | Status: DC | PRN
  Administered 2023-02-17 – 2023-02-19 (×9): 10 mg via ORAL
  Filled 2023-02-17 (×9): qty 2

## 2023-02-17 MED ORDER — LIDOCAINE 2% (20 MG/ML) 5 ML SYRINGE
INTRAMUSCULAR | Status: DC | PRN
  Administered 2023-02-17: 40 mg via INTRAVENOUS

## 2023-02-17 MED ORDER — HYDROMORPHONE HCL 1 MG/ML IJ SOLN
INTRAMUSCULAR | Status: DC | PRN
Start: 2023-02-17 — End: 2023-02-17
  Administered 2023-02-17 (×2): .5 mg via INTRAVENOUS

## 2023-02-17 MED ORDER — LIDOCAINE 2% (20 MG/ML) 5 ML SYRINGE
INTRAMUSCULAR | Status: AC
  Filled 2023-02-17: qty 5

## 2023-02-17 MED ORDER — BISACODYL 5 MG PO TBEC
5.0000 mg | DELAYED_RELEASE_TABLET | Freq: Every day | ORAL | Status: DC | PRN
  Administered 2023-02-17: 5 mg via ORAL
  Filled 2023-02-17: qty 1

## 2023-02-17 MED ORDER — SURGIFLO WITH THROMBIN (HEMOSTATIC MATRIX KIT) OPTIME
TOPICAL | Status: DC | PRN
Start: 2023-02-17 — End: 2023-02-17
  Administered 2023-02-17 (×2): 1

## 2023-02-17 MED ORDER — PROPOFOL 500 MG/50ML IV EMUL
INTRAVENOUS | Status: DC | PRN
Start: 2023-02-17 — End: 2023-02-17
  Administered 2023-02-17: 150 ug/kg/min via INTRAVENOUS
  Administered 2023-02-17: 200 ug/kg/min via INTRAVENOUS

## 2023-02-17 MED ORDER — TRANEXAMIC ACID-NACL 1000-0.7 MG/100ML-% IV SOLN
1000.0000 mg | INTRAVENOUS | Status: AC
  Administered 2023-02-17: 1000 mg via INTRAVENOUS
  Filled 2023-02-17: qty 100

## 2023-02-17 MED ORDER — OXYCODONE HCL 5 MG PO TABS: 5.0000 mg | ORAL_TABLET | ORAL | Status: DC | PRN

## 2023-02-17 MED ORDER — BUPIVACAINE-EPINEPHRINE (PF) 0.25% -1:200000 IJ SOLN
INTRAMUSCULAR | Status: AC
  Filled 2023-02-17: qty 30

## 2023-02-17 MED ORDER — LACTATED RINGERS IV SOLN: INTRAVENOUS | Status: DC

## 2023-02-17 MED ORDER — MEPERIDINE HCL 25 MG/ML IJ SOLN: 6.2500 mg | INTRAMUSCULAR | Status: DC | PRN

## 2023-02-17 MED ORDER — GABAPENTIN 100 MG PO CAPS
200.0000 mg | ORAL_CAPSULE | Freq: Two times a day (BID) | ORAL | Status: DC
  Filled 2023-02-17 (×5): qty 2

## 2023-02-17 MED ORDER — HYDROMORPHONE HCL 1 MG/ML IJ SOLN
INTRAMUSCULAR | Status: AC
  Filled 2023-02-17: qty 0.5

## 2023-02-17 MED ORDER — 0.9 % SODIUM CHLORIDE (POUR BTL) OPTIME
TOPICAL | Status: DC | PRN
  Administered 2023-02-17 (×2): 1000 mL

## 2023-02-17 MED ORDER — INSULIN ASPART 100 UNIT/ML IJ SOLN: 0.0000 [IU] | Freq: Three times a day (TID) | INTRAMUSCULAR | Status: DC

## 2023-02-17 MED ORDER — OXYCODONE-ACETAMINOPHEN 10-325 MG PO TABS: 1.0000 | ORAL_TABLET | Freq: Four times a day (QID) | ORAL | 0 refills | Status: AC | PRN

## 2023-02-17 MED ORDER — DEXAMETHASONE SODIUM PHOSPHATE 10 MG/ML IJ SOLN
INTRAMUSCULAR | Status: DC | PRN
  Administered 2023-02-17: 10 mg via INTRAVENOUS

## 2023-02-17 MED ORDER — ACETAMINOPHEN 10 MG/ML IV SOLN
INTRAVENOUS | Status: AC
  Filled 2023-02-17: qty 100

## 2023-02-17 MED ORDER — SODIUM CHLORIDE 0.9% FLUSH: 3.0000 mL | INTRAVENOUS | Status: DC | PRN

## 2023-02-17 MED ORDER — SUCCINYLCHOLINE CHLORIDE 200 MG/10ML IV SOSY
PREFILLED_SYRINGE | INTRAVENOUS | Status: DC | PRN
  Administered 2023-02-17: 200 mg via INTRAVENOUS

## 2023-02-17 MED ORDER — ROCURONIUM BROMIDE 10 MG/ML (PF) SYRINGE
PREFILLED_SYRINGE | INTRAVENOUS | Status: AC
  Filled 2023-02-17: qty 10

## 2023-02-17 MED ORDER — PROPOFOL 1000 MG/100ML IV EMUL
INTRAVENOUS | Status: AC
  Filled 2023-02-17: qty 400

## 2023-02-17 MED ORDER — CEFAZOLIN IN SODIUM CHLORIDE 3-0.9 GM/100ML-% IV SOLN
3.0000 g | INTRAVENOUS | Status: AC
  Administered 2023-02-17 (×2): 3 g via INTRAVENOUS
  Filled 2023-02-17: qty 100

## 2023-02-17 MED ORDER — MAGNESIUM CITRATE PO SOLN
1.0000 | Freq: Once | ORAL | Status: AC | PRN
  Administered 2023-02-18: 1 via ORAL
  Filled 2023-02-17: qty 296

## 2023-02-17 MED ORDER — NEBIVOLOL HCL 2.5 MG PO TABS
2.5000 mg | ORAL_TABLET | Freq: Every day | ORAL | Status: DC
  Administered 2023-02-18 – 2023-02-19 (×2): 2.5 mg via ORAL
  Filled 2023-02-17 (×2): qty 1

## 2023-02-17 MED ORDER — VERAPAMIL HCL ER 240 MG PO TBCR
240.0000 mg | EXTENDED_RELEASE_TABLET | Freq: Every day | ORAL | Status: DC
  Administered 2023-02-18 – 2023-02-19 (×2): 240 mg via ORAL
  Filled 2023-02-17 (×2): qty 1

## 2023-02-17 MED ORDER — PHENOL 1.4 % MT LIQD: 1.0000 | OROMUCOSAL | Status: DC | PRN

## 2023-02-17 MED ORDER — ORAL CARE MOUTH RINSE: 15.0000 mL | Freq: Once | OROMUCOSAL | Status: AC

## 2023-02-17 MED ORDER — ROSUVASTATIN CALCIUM 5 MG PO TABS
10.0000 mg | ORAL_TABLET | Freq: Every day | ORAL | Status: DC
  Administered 2023-02-18 – 2023-02-19 (×2): 10 mg via ORAL
  Filled 2023-02-17 (×3): qty 2

## 2023-02-17 MED ORDER — POLYETHYLENE GLYCOL 3350 17 G PO PACK: 17.0000 g | PACK | Freq: Every day | ORAL | Status: DC | PRN

## 2023-02-17 MED ORDER — THROMBIN 20000 UNITS EX SOLR
CUTANEOUS | Status: DC | PRN
  Administered 2023-02-17: 20 mL

## 2023-02-17 MED ORDER — CHLORHEXIDINE GLUCONATE 0.12 % MT SOLN
15.0000 mL | Freq: Once | OROMUCOSAL | Status: AC
  Administered 2023-02-17: 15 mL via OROMUCOSAL
  Filled 2023-02-17: qty 15

## 2023-02-17 MED ORDER — DEXAMETHASONE SODIUM PHOSPHATE 10 MG/ML IJ SOLN
INTRAMUSCULAR | Status: AC
  Filled 2023-02-17: qty 1

## 2023-02-17 MED ORDER — ACETAMINOPHEN 10 MG/ML IV SOLN
INTRAVENOUS | Status: DC | PRN
Start: 2023-02-17 — End: 2023-02-17
  Administered 2023-02-17: 1000 mg via INTRAVENOUS

## 2023-02-17 MED ORDER — MIDAZOLAM HCL 2 MG/2ML IJ SOLN: 0.5000 mg | Freq: Once | INTRAMUSCULAR | Status: DC | PRN

## 2023-02-17 MED ORDER — OXYCODONE HCL 5 MG/5ML PO SOLN: 5.0000 mg | Freq: Once | ORAL | Status: DC | PRN

## 2023-02-17 MED ORDER — THROMBIN 20000 UNITS EX KIT
PACK | CUTANEOUS | Status: AC
  Filled 2023-02-17: qty 1

## 2023-02-17 MED ORDER — ACETAMINOPHEN 325 MG PO TABS
650.0000 mg | ORAL_TABLET | ORAL | Status: DC | PRN
  Administered 2023-02-18: 650 mg via ORAL
  Filled 2023-02-17: qty 2

## 2023-02-17 MED ORDER — EPHEDRINE 5 MG/ML INJ
INTRAVENOUS | Status: AC
  Filled 2023-02-17: qty 5

## 2023-02-17 MED ORDER — OXYCODONE HCL 5 MG PO TABS: 5.0000 mg | ORAL_TABLET | Freq: Once | ORAL | Status: DC | PRN

## 2023-02-17 MED ORDER — CEFAZOLIN SODIUM-DEXTROSE 1-4 GM/50ML-% IV SOLN
1.0000 g | Freq: Three times a day (TID) | INTRAVENOUS | Status: AC
  Administered 2023-02-17 – 2023-02-18 (×2): 1 g via INTRAVENOUS
  Filled 2023-02-17 (×2): qty 50

## 2023-02-17 MED ORDER — PROPOFOL 10 MG/ML IV BOLUS
INTRAVENOUS | Status: DC | PRN
  Administered 2023-02-17: 50 mg via INTRAVENOUS
  Administered 2023-02-17: 150 mg via INTRAVENOUS

## 2023-02-17 MED ORDER — METHOCARBAMOL 500 MG PO TABS: 500.0000 mg | ORAL_TABLET | Freq: Three times a day (TID) | ORAL | 0 refills | Status: AC | PRN

## 2023-02-17 MED ORDER — ACETAMINOPHEN 650 MG RE SUPP: 650.0000 mg | RECTAL | Status: DC | PRN

## 2023-02-17 MED ORDER — ONDANSETRON HCL 4 MG/2ML IJ SOLN: 4.0000 mg | Freq: Four times a day (QID) | INTRAMUSCULAR | Status: DC | PRN

## 2023-02-17 MED ORDER — ONDANSETRON HCL 4 MG/2ML IJ SOLN
INTRAMUSCULAR | Status: DC | PRN
  Administered 2023-02-17: 4 mg via INTRAVENOUS

## 2023-02-17 MED ORDER — SODIUM CHLORIDE 0.9% FLUSH: 10.0000 mL | Freq: Two times a day (BID) | INTRAVENOUS | Status: DC

## 2023-02-17 MED ORDER — INSULIN ASPART 100 UNIT/ML IJ SOLN
0.0000 [IU] | Freq: Every day | INTRAMUSCULAR | Status: DC
  Administered 2023-02-17: 2 [IU] via SUBCUTANEOUS

## 2023-02-17 MED ORDER — METHOCARBAMOL 500 MG PO TABS
500.0000 mg | ORAL_TABLET | Freq: Four times a day (QID) | ORAL | Status: DC | PRN
  Administered 2023-02-17 – 2023-02-19 (×6): 500 mg via ORAL
  Filled 2023-02-17 (×6): qty 1

## 2023-02-17 MED ORDER — BUPIVACAINE-EPINEPHRINE 0.25% -1:200000 IJ SOLN
INTRAMUSCULAR | Status: DC | PRN
  Administered 2023-02-17: 10 mL

## 2023-02-17 MED ORDER — MORPHINE SULFATE (PF) 4 MG/ML IV SOLN: 4.0000 mg | INTRAVENOUS | Status: DC | PRN

## 2023-02-17 MED ORDER — FENTANYL CITRATE (PF) 100 MCG/2ML IJ SOLN
INTRAMUSCULAR | Status: AC
  Filled 2023-02-17: qty 2

## 2023-02-17 MED ORDER — PROPOFOL 10 MG/ML IV BOLUS
INTRAVENOUS | Status: AC
  Filled 2023-02-17: qty 20

## 2023-02-17 MED ORDER — METFORMIN HCL ER 500 MG PO TB24
500.0000 mg | ORAL_TABLET | Freq: Every day | ORAL | Status: DC
  Administered 2023-02-18 – 2023-02-19 (×2): 500 mg via ORAL
  Filled 2023-02-17 (×2): qty 1

## 2023-02-17 MED ORDER — METFORMIN HCL ER 500 MG PO TB24: 500.0000 mg | ORAL_TABLET | Freq: Every day | ORAL | Status: DC

## 2023-02-17 MED ORDER — MENTHOL 3 MG MT LOZG
1.0000 | LOZENGE | OROMUCOSAL | Status: DC | PRN
  Administered 2023-02-17 – 2023-02-19 (×2): 3 mg via ORAL
  Filled 2023-02-17 (×2): qty 9

## 2023-02-17 MED ORDER — METHOCARBAMOL 1000 MG/10ML IJ SOLN: 500.0000 mg | Freq: Four times a day (QID) | INTRAVENOUS | Status: DC | PRN

## 2023-02-17 MED ORDER — ACETAMINOPHEN 500 MG PO TABS
1000.0000 mg | ORAL_TABLET | Freq: Once | ORAL | Status: AC
  Administered 2023-02-17: 1000 mg via ORAL
  Filled 2023-02-17: qty 2

## 2023-02-17 MED ORDER — INSULIN ASPART 100 UNIT/ML IJ SOLN: 0.0000 [IU] | INTRAMUSCULAR | Status: DC | PRN

## 2023-02-17 MED ORDER — ONDANSETRON HCL 4 MG PO TABS: 4.0000 mg | ORAL_TABLET | Freq: Four times a day (QID) | ORAL | Status: DC | PRN

## 2023-02-17 MED ORDER — SODIUM CHLORIDE 0.9% FLUSH: 3.0000 mL | Freq: Two times a day (BID) | INTRAVENOUS | Status: DC

## 2023-02-17 MED ORDER — HYDROMORPHONE HCL 1 MG/ML IJ SOLN: 0.2500 mg | INTRAMUSCULAR | Status: DC | PRN

## 2023-02-17 MED ORDER — MIDAZOLAM HCL 2 MG/2ML IJ SOLN
INTRAMUSCULAR | Status: DC | PRN
  Administered 2023-02-17: 2 mg via INTRAVENOUS

## 2023-02-17 MED ORDER — ONDANSETRON HCL 4 MG/2ML IJ SOLN
INTRAMUSCULAR | Status: AC
  Filled 2023-02-17: qty 2

## 2023-02-17 MED ORDER — FENTANYL CITRATE (PF) 250 MCG/5ML IJ SOLN
INTRAMUSCULAR | Status: DC | PRN
  Administered 2023-02-17: 100 ug via INTRAVENOUS

## 2023-02-17 MED ORDER — ONDANSETRON HCL 4 MG PO TABS: 4.0000 mg | ORAL_TABLET | Freq: Three times a day (TID) | ORAL | 0 refills | Status: DC | PRN

## 2023-02-17 MED ORDER — MIDAZOLAM HCL 2 MG/2ML IJ SOLN
INTRAMUSCULAR | Status: AC
  Filled 2023-02-17: qty 2

## 2023-02-17 MED ORDER — PHENYLEPHRINE 80 MCG/ML (10ML) SYRINGE FOR IV PUSH (FOR BLOOD PRESSURE SUPPORT)
PREFILLED_SYRINGE | INTRAVENOUS | Status: AC
  Filled 2023-02-17: qty 10

## 2023-02-17 MED ORDER — SODIUM CHLORIDE 0.9 % IV SOLN
0.0125 ug/kg/min | Freq: Once | INTRAVENOUS | Status: AC
  Administered 2023-02-17: .02 ug/kg/min via INTRAVENOUS
  Filled 2023-02-17: qty 2000

## 2023-02-17 SURGICAL SUPPLY — 71 items
AGENT HMST KT MTR STRL THRMB (HEMOSTASIS) ×2
BAG COUNTER SPONGE SURGICOUNT (BAG) ×1 IMPLANT
BAG SPNG CNTER NS LX DISP (BAG) ×1
BLADE CLIPPER SURG (BLADE) IMPLANT
BUR NEURO DRILL SOFT 3.0X3.8M (BURR) IMPLANT
CABLE BIPOLOR RESECTION CORD (MISCELLANEOUS) ×1 IMPLANT
CAGE SPNL 6D 12XSM 14X7X (Cage) IMPLANT
CANISTER SUCT 3000ML PPV (MISCELLANEOUS) ×1 IMPLANT
CLSR STERI-STRIP ANTIMIC 1/2X4 (GAUZE/BANDAGES/DRESSINGS) ×1 IMPLANT
COVER MAYO STAND STRL (DRAPES) ×3 IMPLANT
COVER SURGICAL LIGHT HANDLE (MISCELLANEOUS) ×2 IMPLANT
DRAIN CHANNEL 15F RND FF W/TCR (WOUND CARE) IMPLANT
DRAPE C-ARM 42X72 X-RAY (DRAPES) ×1 IMPLANT
DRAPE POUCH INSTRU U-SHP 10X18 (DRAPES) ×1 IMPLANT
DRAPE SURG 17X23 STRL (DRAPES) ×1 IMPLANT
DRAPE U-SHAPE 47X51 STRL (DRAPES) ×1 IMPLANT
DRSG OPSITE POSTOP 3X4 (GAUZE/BANDAGES/DRESSINGS) ×1 IMPLANT
DURAPREP 26ML APPLICATOR (WOUND CARE) ×1 IMPLANT
ELECT COATED BLADE 2.86 ST (ELECTRODE) ×1 IMPLANT
ELECT NVM5 SURFACE MEP/EMG (ELECTRODE) IMPLANT
ELECT PENCIL ROCKER SW 15FT (MISCELLANEOUS) ×1 IMPLANT
ELECT REM PT RETURN 9FT ADLT (ELECTROSURGICAL) ×1
ELECTRODE REM PT RTRN 9FT ADLT (ELECTROSURGICAL) ×1 IMPLANT
FEE INTRAOP CADWELL SUPPLY NCS (MISCELLANEOUS) IMPLANT
FEE INTRAOP MONITOR IMPULS NCS (MISCELLANEOUS) IMPLANT
FUSION TCS NANOLOCK 7MM 6DEG (Cage) ×1 IMPLANT
GLOVE BIO SURGEON STRL SZ 6.5 (GLOVE) ×1 IMPLANT
GLOVE BIOGEL PI IND STRL 6.5 (GLOVE) ×1 IMPLANT
GLOVE BIOGEL PI IND STRL 8.5 (GLOVE) ×1 IMPLANT
GLOVE SS BIOGEL STRL SZ 8.5 (GLOVE) ×1 IMPLANT
GOWN STRL REUS W/ TWL LRG LVL3 (GOWN DISPOSABLE) ×1 IMPLANT
GOWN STRL REUS W/TWL 2XL LVL3 (GOWN DISPOSABLE) ×1 IMPLANT
GOWN STRL REUS W/TWL LRG LVL3 (GOWN DISPOSABLE) ×1
INTRAOP CADWELL SUPPLY FEE NCS (MISCELLANEOUS)
INTRAOP DISP SUPPLY FEE NCS (MISCELLANEOUS)
INTRAOP MONITOR FEE IMPULS NCS (MISCELLANEOUS)
KIT BASIN OR (CUSTOM PROCEDURE TRAY) ×1 IMPLANT
KIT TURNOVER KIT B (KITS) ×1 IMPLANT
MODULE EMG NDL SSEP NVM5 (NEUROSURGERY SUPPLIES) IMPLANT
MODULE EMG NEEDLE SSEP NVM5 (NEUROSURGERY SUPPLIES) ×1
NDL HYPO 22X1.5 SAFETY MO (MISCELLANEOUS) ×1 IMPLANT
NDL SPNL 18GX3.5 QUINCKE PK (NEEDLE) ×1 IMPLANT
NEEDLE HYPO 22X1.5 SAFETY MO (MISCELLANEOUS) ×1
NEEDLE SPNL 18GX3.5 QUINCKE PK (NEEDLE) ×1
NS IRRIG 1000ML POUR BTL (IV SOLUTION) ×1 IMPLANT
PACK ORTHO CERVICAL (CUSTOM PROCEDURE TRAY) ×1 IMPLANT
PACK UNIVERSAL I (CUSTOM PROCEDURE TRAY) ×1 IMPLANT
PAD ARMBOARD 7.5X6 YLW CONV (MISCELLANEOUS) ×2 IMPLANT
PATTIES SURGICAL .25X.25 (GAUZE/BANDAGES/DRESSINGS) IMPLANT
PIN DISTRATION 14MM (PIN) IMPLANT
PLATE LOCK ENDO TCS (Plate) ×1 IMPLANT
PLATE LOCK ENDO TCS F/COVER (Plate) IMPLANT
POSITIONER HEAD DONUT 9IN (MISCELLANEOUS) ×1 IMPLANT
PUTTY BONE DBX 2.5 MIS (Bone Implant) IMPLANT
RESTRAINT LIMB HOLDER UNIV (RESTRAINTS) ×1 IMPLANT
SCREW 3.8X16MM (Screw) IMPLANT
SCREW ENDO BONE 3.8X14MM (Screw) IMPLANT
SPONGE INTESTINAL PEANUT (DISPOSABLE) ×1 IMPLANT
SPONGE SURGIFOAM ABS GEL SZ50 (HEMOSTASIS) ×1 IMPLANT
SURGIFLO W/THROMBIN 8M KIT (HEMOSTASIS) IMPLANT
SUT BONE WAX W31G (SUTURE) ×1 IMPLANT
SUT MNCRL AB 3-0 PS2 27 (SUTURE) ×1 IMPLANT
SUT VIC AB 2-0 CT1 18 (SUTURE) ×1 IMPLANT
SYR BULB IRRIG 60ML STRL (SYRINGE) ×1 IMPLANT
SYR CONTROL 10ML LL (SYRINGE) ×1 IMPLANT
TAPE CLOTH 4X10 WHT NS (GAUZE/BANDAGES/DRESSINGS) ×1 IMPLANT
TAPE UMBILICAL 1/8X30 (MISCELLANEOUS) ×1 IMPLANT
TOWEL GREEN STERILE (TOWEL DISPOSABLE) ×1 IMPLANT
TOWEL GREEN STERILE FF (TOWEL DISPOSABLE) ×1 IMPLANT
WATER STERILE IRR 1000ML POUR (IV SOLUTION) ×1 IMPLANT
YANKAUER SUCT BULB TIP NO VENT (SUCTIONS) IMPLANT

## 2023-02-17 NOTE — Op Note (Signed)
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  Graft: DBX mix  Neuromonitoring: No abnormal activity noted on SSEP or evoked motor potential monitoring.  BRIEF HISTORY: Adam Hahn is a 57 y.o. male who presented my office with signs and symptoms of cervical myelopathy.  Patient had cord signal changes on his MRI.  As result of his clinical and imaging findings we elected to move forward with surgery.  All appropriate risks benefits and alternatives were discussed with the patient and consent was obtained.  PROCEDURE DETAILS: Patient was brought into the operating room and was properly positioned on the operating room table.  After induction with general anesthesia the patient was endotracheally intubated.  A timeout was taken to confirm all important data: including patient, procedure, and the level. Teds, SCD's were applied.   A Foley was placed by the nurse and the neuromonitoring representative placed all appropriate leads.  The anterior cervical spine was then prepped and draped in standard fashion.  Using fluoroscopy I marked out the C3-4 level.  I infiltrated the incision with quarter percent Marcaine.  Transverse incision was made starting in the midline and proceeding to the left.  Sharp dissection was carried out down to and through the platysma.  I then identified the medial border the sternocleidomastoid and performed a sharp dissection in the avascular plane.  The dissection was quite  complicated due to the.  I continued dissecting with Kitner dissectors until identified the carotid sheath.  I then swept the esophagus and trachea to the right and began dissecting through the remainder of the prevertebral fascia to expose the anterior longitudinal ligament.  Once the anterior longitudinal ligament was exposed I then placed a needle into the C3-4 disc space.  X-ray was taken confirming that I was at the appropriate level.  The disc space was marked with the Bovie.  Using bipolar cautery I mobilized the longus coli muscles out laterally until I could see the uncovertebral joints.  This was done from the mid body of C3 to the mid body of C4.  This was done bilaterally.  Prior to placing the retractor blades I noted that there was a questionable air leak.  Anesthesia then came into and reevaluated the patient and noted that the cuff had backed out.  It was deflated the endotracheal cuff was repositioned and reinflated.  Once this issue was addressed I continued with the surgery.  A 60 mm Caspar retracting blade was placed into the wound and the ends of the blades were placed underneath the longus coli muscle.  Once both blades were in place I expanded the retractor so I had better visualization.  An annulotomy was then performed with a 15 blade scalpel and the anterior osteophytes were resected with pituitary rongeurs.  I continued removing the bulk of the disc material with a pituitary rongeurs.  I then placed distraction pins into the body of C3 and C4 and then gently distracted the intervertebral space with a lamina spreader  this was maintained with the distraction pin set.  I then used curettes to continue removing the bulk of the disc material.  I was able to identify the uncovertebral joints and this served as my lateral borders from my discectomy.  Once I had all of the disc material out and I was looking at the posterior aspect of the vertebral body I then used my nerve hook to gently  dissect through the posterior annulus.  Once I created a plane I used a 1 mm Kerrison rongeur to resect the posterior osteophyte from the vertebral bodies of C3 and C4 and resect the posterior annulus.  I was able to undercut under the uncovertebral joint to further decompress the nerve root.  Continue using my nerve hook up I was able to sweep behind the vertebral body of C5 for and develop a plane under the posterior longitudinal ligament.  I used a 1 mm Kerrison rongeur to resect this.  I can now easily pass my nerve hook behind the vertebral bodies of C3 and C4 and under the uncovertebral joints.  I confirmed this with fluoroscopy.  I also confirmed that parallel endplate distraction under live fluoroscopy.  Once the decompression was complete I then moved forward with the instrumentation.   Using trial implants I elected to use the size 7 small cage.  The 0 profile cage was selected to decrease the overall potential risk of irritation to the esophagus.  The endplates were prepped and rasped so had bleeding subchondral bone and there was no residual disc or cartilaginous material.  I then irrigated the wound copiously with normal saline.  The distraction pins were then removed and the resulting bleeding bone was sealed with bone wax.  The implant was then [facility] to the appropriate depth.  I was able to be countersink the implant.  I used an awl to broach the C4 vertebral body and I placed the locking screw which was 3.8 x 14 mm through the cage into the C4 vertebral body.  The second screw was placed through the cage and into the C3 vertebral body.  This was a 3.8 x 16 mm length screw.  Both screws had excellent purchase and the device was well-seated.  At this point I removed the retractors and placed my hand-held retractor.  I irrigated the wound copiously with normal saline and then placed Floseal.  After 5 minutes the Floseal was irrigated out and a bleeder was identified and coagulated with bipolar  electrocautery.  I irrigated again and confirmed there was no more active bleeding.  The trach and esophagus are returned to midline and I observe the wound for approximately 4 to 5 minutes to ensure there was no bleeding.  I then closed the platysma with interrupted 2-0 Vicryl sutures and the skin with 3-0 Monocryl.  Steri-Strips and dry dressing were applied as was the Aspen collar.  Patient was then extubated transferred the PACU without incident.  The end of the case all needle sponge counts were correct.  There were no adverse intraoperative events.  Venita Lick, MD 02/17/2023 12:16 PM

## 2023-02-17 NOTE — Progress Notes (Signed)
   02/17/23 2121  BiPAP/CPAP/SIPAP  $ Non-Invasive Home Ventilator  Subsequent  BiPAP/CPAP/SIPAP Pt Type Adult  BiPAP/CPAP/SIPAP Resmed  IPAP 20 cmH20  EPAP 10 cmH2O  FiO2 (%) 21 %  Patient Home Equipment Yes  Auto Titrate Yes  CPAP/SIPAP surface wiped down Yes  Safety Check Completed by RT for Home Unit Yes, no issues noted  BiPAP/CPAP /SiPAP Vitals  Bilateral Breath Sounds Clear;Diminished

## 2023-02-17 NOTE — Anesthesia Procedure Notes (Addendum)
Date/Time: 02/17/2023 9:28 AM  Performed by: Earlene Plater, CRNALaryngoscope Size: Glidescope and 4 Grade View: Grade I Tube type: Oral Tube size: 7.5 mm Airway Equipment and Method: Video-laryngoscopy and Bite block Placement Confirmation: ETT inserted through vocal cords under direct vision, positive ETCO2, CO2 detector and breath sounds checked- equal and bilateral Secured at: 26 (@lips ; Confirmed by Dr. Jean Rosenthal) cm Comments: ETT assessed to have migrated causing a minor leak;  ETT advanced to 26cm @ lips, secured in place. TV, O2 saturation, and all VS remained stable.

## 2023-02-17 NOTE — H&P (Signed)
History:  Adam Hahn is a very pleasant 57 year old gentleman whose had a 66-month history of progressive weakness in the upper and lower extremities, difficulty maintaining his balance, and progressive dysesthesias. Imaging studies were done and the MRI noted cord signal changes at C3-4 consistent with cervical myelopathy. 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   Current Outpatient Medications on File Prior to Encounter  Medication Sig Dispense Refill   Cholecalciferol 50 MCG (2000 UT) TABS Take 2,000 Units by mouth daily.     cholestyramine (QUESTRAN) 4 g packet TAKE 1 PACKET BY MOUTH 2 TIMES DAILY. 60 packet 0   gabapentin (NEURONTIN) 100 MG capsule Take 200 mg by mouth 2 (two) times daily.     metFORMIN (GLUCOPHAGE-XR) 500 MG 24 hr tablet Take 1 tablet (500 mg total) by mouth daily with breakfast. 30 tablet 5   nebivolol (BYSTOLIC) 2.5 MG tablet Take 1 tablet (2.5 mg total) by mouth daily. 90 tablet 1   Semaglutide, 2 MG/DOSE, (OZEMPIC, 2 MG/DOSE,) 8 MG/3ML SOPN Inject 2 mg into the skin every 7 (seven)  days. 3 mL 5   verapamil (CALAN-SR) 240 MG CR tablet Take 1 tablet (240 mg total) by mouth daily. 30 tablet 3   Azelastine-Fluticasone 137-50 MCG/ACT SUSP Place 1 spray into the nose every 12 (twelve) hours. (Patient not taking: Reported on 02/02/2023) 23 g 1   benzonatate (TESSALON) 100 MG capsule Take 1 capsule (100 mg total) by mouth 2 (two) times daily as needed for cough. (Patient not taking: Reported on 02/02/2023) 20 capsule 0   cetirizine (ZYRTEC ALLERGY) 10 MG tablet Take 1 tablet (10 mg total) by mouth daily. 30 tablet 0   rosuvastatin (CRESTOR) 10 MG tablet Take 10 mg by mouth daily.     sildenafil (VIAGRA) 100 MG tablet Take 100 mg by mouth daily as needed.     Testosterone 20.25 MG/ACT (1.62%) GEL Place 40.5 mg onto the skin daily. (Patient not taking: Reported on 02/02/2023) 75 g 2    Physical Exam: Vitals:   02/17/23 0552  BP: 130/89  Pulse: 83  Resp: 18  Temp: 98.2 F (36.8 C)  SpO2: 97%   Body mass index is 34.57 kg/m. Clinical exam: Adam Hahn is a pleasant individual, who appears younger than their stated age.  He is alert and orientated 3.  No shortness of breath, chest pain.  Abdomen is soft and non-tender, negative loss of bowel and bladder control, no rebound tenderness.  Negative:  skin lesions abrasions contusions  Peripheral pulses: 2+ peripheral pulses bilaterally. LE compartments are: Soft and nontender.  Gait pattern: Abnormal gait pattern. Unable to heel toe ambulate and maintain his balance. Positive Romberg sign  Assistive devices: None  Neuro: Reflexes: Positive Babinski test, positive Hoffman test, negative Lhermitte sign. Negative inverted brachioradialis reflex. 1+ deep tendon reflexes in the upper extremity. 2+ deep tendon reflexes at the knee absent at the Achilles. No clonus.  Upper extremity motor strength: 3+/5 right deltoid and bicep. 4+/5 right tricep, grip, and wrist extensor. 4+/5 left upper extremity strength throughout.  Generalized  weakness bilaterally in the left quad and hip flexor (4/5). 5/5 EHL/tibialis anterior/gastrocnemius bilaterally.  Musculoskeletal: Significant neck pain with palpation and range of motion. Positive occipital headaches and crepitus.  Imaging: X-rays of the cervical spine completed on 09/29/2022 demonstrate loss of normal cervical lordosis. Mild degenerative disease at C3-4. Small anterior traction spurs throughout the cervical spine.  Cervical MRI: completed on 12/23/2022:   Focused increased T2 signal at the C3-4 disc space level consistent with myelomalacia. Severe spinal stenosis at C3-4 due to hard disk osteophyte. Mild central stenosis at C5-6 with moderate left foraminal stenosis. No stenosis C4-5, or C6-T1.  A/P:  Adam Hahn is a very pleasant 57 year old gentleman who has a 50-month history of progressive neurologic deterioration. imaging studies confirm cord signal change consistent with myelomalacia. 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  The patient has progressive weakness in the upper and lower extremity, difficulty ambulating, and diminished manual dexterity and fine motor control. I have gone over the pathology and imaging studies with the patient and his wife. I have explained to them the natural history of cervical myelopathy which is 1 of progression that will ultimately lead to lower extremity weakness and loss in ambulation. Recommendation for treatment is surgery.  I recommended an ACDF at C3-4. This would allow doing compression of the thecal sac and stabilization of the level. We will utilize the [facility] expandable intervertebral cage 0 profile. Use DBX mix for allograft. And we will also use neuromonitoring given the fact that he has myelopathy.  Risks and benefits of cervical fusion surgery were discussed with the patient. These include: Infection, bleeding, death, stroke, paralysis, ongoing or worse pain, need for additional surgery,  nonunion, leak of spinal fluid, adjacent segment degeneration requiring additional fusion surgery. Pseudoarthrosis (nonunion)requiring supplemental posterior fixation. Throat pain, swallowing difficulties, hoarseness or change in voice.

## 2023-02-17 NOTE — Anesthesia Postprocedure Evaluation (Signed)
Anesthesia Post Note  Patient: KHALI ALBANESE  Procedure(s) Performed: ANTERIOR CERVICAL DECOMPRESSION/DISCECTOMY FUSION 1 LEVEL C3-4     Patient location during evaluation: PACU Anesthesia Type: General Level of consciousness: awake and alert, patient cooperative and oriented Pain management: pain level controlled Vital Signs Assessment: post-procedure vital signs reviewed and stable Respiratory status: spontaneous breathing, nonlabored ventilation and respiratory function stable Cardiovascular status: blood pressure returned to baseline and stable Postop Assessment: no apparent nausea or vomiting Anesthetic complications: no   No notable events documented.  Last Vitals:  Vitals:   02/17/23 1415 02/17/23 1443  BP: 124/84 131/87  Pulse: 97 95  Resp: 17 20  Temp:  37.2 C  SpO2: 94% 96%    Last Pain:  Vitals:   02/17/23 1443  TempSrc: Oral  PainSc:                  Kato Wieczorek,E. Dezire Turk

## 2023-02-17 NOTE — Anesthesia Procedure Notes (Addendum)
Date/Time: 02/17/2023 8:01 AM  Performed by: Earlene Plater, CRNAPre-anesthesia Checklist: Patient identified, Emergency Drugs available, Patient being monitored and Timeout performed Patient Re-evaluated:Patient Re-evaluated prior to induction Oxygen Delivery Method: Circle system utilized Preoxygenation: Pre-oxygenation with 100% oxygen Induction Type: IV induction Ventilation: Mask ventilation without difficulty Laryngoscope Size: Glidescope and 4 Grade View: Grade I Tube type: Oral Tube size: 7.5 mm Number of attempts: 1 Airway Equipment and Method: Bite block, Video-laryngoscopy and Stylet Placement Confirmation: ETT inserted through vocal cords under direct vision, positive ETCO2, CO2 detector and breath sounds checked- equal and bilateral Secured at: 24 (@ lips; Confirmed by Dr. Jean Rosenthal) cm Tube secured with: Tape Dental Injury: Teeth and Oropharynx as per pre-operative assessment

## 2023-02-17 NOTE — Discharge Instructions (Signed)
Today you will be discharged from the hospital.  The purpose of the following handout is to help guide you over the next 2 weeks.  First and foremost, be sure you have a follow up appointment with Dr. Shon Baton 2 weeks from the time of your surgery to have your sutures removed.  Please call [facility] [phone] to schedule or confirm this appointment.      Brace You do not have to wear the collar while lying in bed or sitting in a high-backed chair, eating, sleeping or showering.  Other than these instances, you must wear the brace.  You may NOT wear the collar while driving a vehicle (see driving restrictions below).  It is advisable that you wear the collar in public places or while traveling in a car as a passenger.  Dr. Shon Baton will discuss further use of the collar at your 2 week postop visit.  Wound Care You may SHOWER 5 days from the date of surgery.  Shower directly over the steri-strips.  DO NOT scrub or submerge (bath tub, swimming pool, hot tub, etc.) the area.  Pat to dry following your shower.  There is no need for additional dressings other than the steri-strips.  Allow the steri-strips to fall off on their own.  Once the strips have fallen off, you may leave the area undressed.  DO NOT apply lotion/cream/ointment to the area.  The wound must remain dry at all times other than while showering.  Dr. Shon Baton or his staff will remove your stiches at your first postop visit and give you additional instructions regarding wound care at that time.   Activity NO DRIVING FOR 2 WEEKS.  No lifting over 5 pounds (approximately a gallon of milk).  No bending, stooping, squatting or twisting.  No overhead activities.  We encourage you to walk (short distances and often throughout the day) as you can tolerate.  A good rule of thumb is to get up and move once or twice every hour.  You may go up and down stairs carefully.  As you continue to recover, Dr. Shon Baton will address and  adjust restrictions to your activities until no further restrictions are needed.  However, until your first postop visit, when Dr. Shon Baton can assess your recovery, you are to follow these instructions.  At the end of this document is a tentative outline of activities for up to 1 year.       Medication You will be discharged from the hospital with medication for pain, spasm, nausea and constipation.  You will be given enough medication to last until your first postop visit in 2 weeks.  Medications WILL NOT BE REFILLED EARLY; therefore, you are to take the medications only as directed.  If you have been given multiple prescriptions, please leave them with your pharmacy.  They can keep them on file for when you need them.  Medications that are lost or stolen WILL NOT be replaced.  We will address the need for continuing certain medications on an individual basis during your postop visit.  We ask that you avoid over the counter anti-inflammatory medications (Advil, Aleve, Motrin) for 3 months.    What you can expect following neck surgery... It is not uncommon to experience a sore throat or difficulty swallowing following neck surgery.  Cold liquids and soft foods are helpful in soothing this discomfort.  There is no specific diet that you are to follow after surgery, however, there are a  few things you should keep in mind to avoid unneeded discomfort.  Take small bites and eat slowly.  Chew your food thoroughly before swallowing.   It is not uncommon to experience incisional soreness or pain in the back of the neck, shoulders or between the shoulder blades.  These symptoms will slowly begin to resolve as you continue to recover, however, they can last for a few weeks.    It is not uncommon to experience INTERMITTENT arm pain following surgery.  This pain can mimic the arm pain you had prior to surgery.  As long as the pain resolves on its own and is not constant, there is no need to become alarmed.    When To Call If you experience fever >101F, loss of bowel or bladder control, painful swelling in the lower extremities, constant (unresolving) arm pain.  If you experience any of these symptoms, please call [facility] [phone].  What's Next As mentioned earlier, you will follow up with Dr. Shon Baton in 2 weeks.  At that time, we will likely remove your stitches and discuss additional aspects of your recovery.                   ACTIVITY GUIDELINES ANTERIOR CERVICAL DISECTOMY AND FUSION  Activity Discharge 2 weeks 6 weeks 3 months 6 months 1 year  Shower 5 days        Submerge the wound  no no yes     Walking outdoors yes       Lifting 5 lbs yes       Climbing stairs yes       Cooking yes       Car rides (less than 30 minutes) yes       Car rides (greater than 30 minutes) no varies yes     Air travel no varies yes     Short outings [employer], visits, etc...) yes       School no no yes     Driving a car no no varies yes    Light upper extremity exercises no no varies yes    Stationary bike no no yes     Swimming (no diving) no no no varies yes   Vacuuming, laundry, mopping no no no varies yes   Biking outdoors no no no no varies yes  Light jogging no no no varies yes   Low impact aerobics no no no varies yes   Non-contact sports (tennis, golf) no no no varies yes   Hunting (no tree climbing) no no no varies yes   Dancing (non-gymnastics) no no no varies yes   Down-hill skiing (experienced skier) no no no no yes   Down-hill skiing (novice) no no no no yes   Cross-country skiing no no no no yes   Horseback riding (noncompetitive)  no no no no yes   Horseback riding (competitive) no no no no varies yes  Gardening/landscaping no no no varies yes   House repairs no no no varies varies yes  Lifting up to 50 lbs no no no no varies yes

## 2023-02-17 NOTE — Brief Op Note (Signed)
02/17/2023  12:31 PM  PATIENT:  Adam Hahn  57 y.o. male  PRE-OPERATIVE DIAGNOSIS:  Cervical myelopathy C3-4  POST-OPERATIVE DIAGNOSIS:  Cervical myelopathy C3-4  PROCEDURE:  Procedure(s) with comments: ANTERIOR CERVICAL DECOMPRESSION/DISCECTOMY FUSION 1 LEVEL C3-4 (N/A) - 3 hrs 3 C-Bed  SURGEON:  Surgeons and Role:    Venita Lick, MD - Primary  PHYSICIAN ASSISTANT:   ASSISTANTS: Luther Bradley  ANESTHESIA:   general  EBL:  100 mL   BLOOD ADMINISTERED:none  DRAINS: none   LOCAL MEDICATIONS USED:  MARCAINE     SPECIMEN:  No Specimen  DISPOSITION OF SPECIMEN:  N/A  COUNTS:  YES  TOURNIQUET:  * No tourniquets in log *  DICTATION: .Dragon Dictation  PLAN OF CARE: Admit for overnight observation  PATIENT DISPOSITION:  PACU - hemodynamically stable.

## 2023-02-17 NOTE — Transfer of Care (Signed)
Immediate Anesthesia Transfer of Care Note  Patient: Adam Hahn  Procedure(s) Performed: ANTERIOR CERVICAL DECOMPRESSION/DISCECTOMY FUSION 1 LEVEL C3-4  Patient Location: PACU  Anesthesia Type:General  Level of Consciousness: drowsy, patient cooperative, and responds to stimulation  Airway & Oxygen Therapy: Patient Spontanous Breathing and Patient connected to face mask oxygen  Post-op Assessment: Report given to RN and Post -op Vital signs reviewed and stable  Post vital signs: Reviewed and stable  Last Vitals:  Vitals Value Taken Time  BP 133/77 02/17/23 1237  Temp 37.1 C 02/17/23 1237  Pulse 98 02/17/23 1242  Resp 21 02/17/23 1242  SpO2 92 % 02/17/23 1242  Vitals shown include unfiled device data.  Last Pain:  Vitals:   02/17/23 1237  TempSrc:   PainSc: Asleep      Patients Stated Pain Goal: 3 (02/17/23 0559)  Complications: No notable events documented.

## 2023-02-18 ENCOUNTER — Encounter (HOSPITAL_COMMUNITY): Payer: Self-pay | Admitting: Orthopedic Surgery

## 2023-02-18 DIAGNOSIS — Z7984 Long term (current) use of oral hypoglycemic drugs: Secondary | ICD-10-CM | POA: Diagnosis not present

## 2023-02-18 DIAGNOSIS — E119 Type 2 diabetes mellitus without complications: Secondary | ICD-10-CM | POA: Diagnosis not present

## 2023-02-18 DIAGNOSIS — I10 Essential (primary) hypertension: Secondary | ICD-10-CM | POA: Diagnosis not present

## 2023-02-18 DIAGNOSIS — Z79899 Other long term (current) drug therapy: Secondary | ICD-10-CM | POA: Diagnosis not present

## 2023-02-18 DIAGNOSIS — M4712 Other spondylosis with myelopathy, cervical region: Secondary | ICD-10-CM | POA: Diagnosis not present

## 2023-02-18 LAB — GLUCOSE, CAPILLARY
Glucose-Capillary: 160 mg/dL — ABNORMAL HIGH (ref 70–99)
Glucose-Capillary: 156 mg/dL — ABNORMAL HIGH (ref 70–99)
Glucose-Capillary: 122 mg/dL — ABNORMAL HIGH (ref 70–99)

## 2023-02-18 MED ORDER — SENNOSIDES-DOCUSATE SODIUM 8.6-50 MG PO TABS
1.0000 | ORAL_TABLET | Freq: Two times a day (BID) | ORAL | Status: DC
  Filled 2023-02-18: qty 1

## 2023-02-18 MED ORDER — DIPHENHYDRAMINE HCL 25 MG PO CAPS
25.0000 mg | ORAL_CAPSULE | Freq: Four times a day (QID) | ORAL | Status: AC | PRN
  Administered 2023-02-18: 25 mg via ORAL
  Filled 2023-02-18: qty 1

## 2023-02-18 MED FILL — Thrombin For Soln Kit 20000 Unit: CUTANEOUS | Qty: 1 | Status: AC

## 2023-02-18 NOTE — Plan of Care (Signed)
CHL Tonsillectomy/Adenoidectomy, Postoperative PEDS care plan entered in error.

## 2023-02-18 NOTE — Evaluation (Signed)
Occupational Therapy Evaluation Patient Details Name: Adam Hahn MRN: [id number] DOB: 1965-07-28 Today's Date: 02/18/2023   History of Present Illness 57 yo M s/p ACDF.  PMH includes: DM II, HTN, Arthritis.   Clinical Impression   Patient admitted for the diagnosis and procedure above.  PTA he lives at home with his spouse, and needed no assist with ADL, iADL or driving.  Patient does have neuropathy and mentions a history of knee buckles and remote history of falls.  Patient did quite well, but is staying until tomorrow.  OT can follow up the next day to ensure compliance with precautions and independence with ADL.  No post acute OT anticipated.         If plan is discharge home, recommend the following: Assist for transportation    Functional Status Assessment  Patient has had a recent decline in their functional status and demonstrates the ability to make significant improvements in function in a reasonable and predictable amount of time.  Equipment Recommendations  None recommended by OT    Recommendations for Other Services       Precautions / Restrictions Precautions Precautions: Fall;Cervical Precaution Booklet Issued: Yes (comment) Required Braces or Orthoses: Cervical Brace Cervical Brace: Hard collar;For comfort Restrictions Weight Bearing Restrictions: No      Mobility Bed Mobility Overal bed mobility: Needs Assistance Bed Mobility: Sidelying to Sit, Sit to Sidelying   Sidelying to sit: Supervision     Sit to sidelying: Supervision      Transfers Overall transfer level: Modified independent                        Balance Overall balance assessment: No apparent balance deficits (not formally assessed)                                         ADL either performed or assessed with clinical judgement   ADL                       Lower Body Dressing: Minimal assistance;Sit to/from stand                        Vision Patient Visual Report: No change from baseline       Perception Perception: Within Functional Limits       Praxis Praxis: [facility]       Pertinent Vitals/Pain Pain Assessment Pain Assessment: Faces Faces Pain Scale: Hurts little more Pain Location: Incisional Pain Descriptors / Indicators: Tender, Sore Pain Intervention(s): Premedicated before session     Extremity/Trunk Assessment Upper Extremity Assessment Upper Extremity Assessment: Overall WFL for tasks assessed   Lower Extremity Assessment Lower Extremity Assessment: Overall WFL for tasks assessed   Cervical / Trunk Assessment Cervical / Trunk Assessment: Neck Surgery   Communication Communication Communication: No apparent difficulties   Cognition Arousal: Alert Behavior During Therapy: WFL for tasks assessed/performed Overall Cognitive Status: Within Functional Limits for tasks assessed                                                        Home Living Family/patient expects to be discharged to:: Private residence Living Arrangements:  Spouse/significant other Available Help at Discharge: Family;Available 24 hours/day Type of Home: House Home Access: Level entry     Home Layout: Multi-level;Able to live on main level with bedroom/bathroom     Bathroom Shower/Tub: Producer, television/film/video: Standard Bathroom Accessibility: Yes How Accessible: Accessible via walker Home Equipment: None          Prior Functioning/Environment Prior Level of Function : Independent/Modified Independent;Driving                        OT Problem List: Pain      OT Treatment/Interventions: Self-care/ADL training;Therapeutic activities    OT Goals(Current goals can be found in the care plan section) Acute Rehab OT Goals Patient Stated Goal: Return home OT Goal Formulation: With patient Time For Goal Achievement: 02/21/23 Potential to Achieve Goals: Good ADL  Goals Pt Will Perform Lower Body Dressing: Independently;sit to/from stand  OT Frequency: Min 1X/week    Co-evaluation              AM-PAC OT "6 Clicks" Daily Activity     Outcome Measure Help from another person eating meals?: None Help from another person taking care of personal grooming?: None Help from another person toileting, which includes using toliet, bedpan, or urinal?: None Help from another person bathing (including washing, rinsing, drying)?: A Little Help from another person to put on and taking off regular upper body clothing?: None Help from another person to put on and taking off regular lower body clothing?: A Little 6 Click Score: 22   End of Session Nurse Communication: Mobility status  Activity Tolerance: Patient tolerated treatment well Patient left: in chair;with call bell/phone within reach;with family/visitor present  OT Visit Diagnosis: Pain Pain - Right/Left:  (Cervical)                Time: 4098-1191 OT Time Calculation (min): 22 min Charges:  OT General Charges $OT Visit: 1 Visit OT Evaluation $OT Eval Moderate Complexity: 1 Mod  02/18/2023  RP, OTR/L  Acute Rehabilitation Services  Office:  [phone]   Adam Hahn 02/18/2023, 9:20 AM

## 2023-02-18 NOTE — Progress Notes (Signed)
    Subjective: Procedure(s) (LRB): ANTERIOR CERVICAL DECOMPRESSION/DISCECTOMY FUSION 1 LEVEL C3-4 (N/A) 1 Day Post-Op  Patient reports pain as 3 on 0-10 scale.  Reports decreased arm pain reports incisional neck pain   Positive void Negative bowel movement Positive flatus Negative chest pain or shortness of breath  Objective: Vital signs in last 24 hours: Temp:  [98.1 F (36.7 C)-98.9 F (37.2 C)] 98.2 F (36.8 C) (10/18 0727) Pulse Rate:  [91-98] 93 (10/18 0727) Resp:  [13-20] 20 (10/18 0727) BP: (124-147)/(77-101) 131/96 (10/18 0727) SpO2:  [90 %-100 %] 97 % (10/18 0727) FiO2 (%):  [21 %] 21 % (10/17 2121)  Intake/Output from previous day: 10/17 0701 - 10/18 0700 In: 1380 [P.O.:480; I.V.:700; IV Piggyback:200] Out: 260 [Urine:160; Blood:100]  Labs: No results for input(s): "WBC", "RBC", "HCT", "PLT" in the last 72 hours. No results for input(s): "NA", "K", "CL", "CO2", "BUN", "CREATININE", "GLUCOSE", "CALCIUM" in the last 72 hours. No results for input(s): "LABPT", "INR" in the last 72 hours.  Physical Exam: Neurologically intact ABD soft Intact pulses distally Incision: dressing C/D/I Compartment soft Body mass index is 34.57 kg/m.  Assessment/Plan: Patient stable  Mobilization with physical therapy Encourage incentive spirometry Continue care  Up with therapy Plan for discharge tomorrow  Venita Lick, MD [facility] [phone]

## 2023-02-18 NOTE — Evaluation (Signed)
Physical Therapy Evaluation Patient Details Name: Adam Hahn MRN: [id number] DOB: 1966-01-08 Today's Date: 02/18/2023  History of Present Illness  57 yo M s/p ACDF.  PMH includes: DM II, HTN, Arthritis.  Clinical Impression   Pt is a pleasant 57 year old male that reported to [facility] on 10/17 for elective cervical fusion of C3-4.  Pt was independent for mobility and transfers. Pt tolerated the session well without any needs for assistance from the therapist. Pt reports good knowledge of his neck precautions. PT encourage patient to mobilize often. PT does not recommend continued skilled physical therapy due to the patient's independence level. Acute PT is signing off.       If plan is discharge home, recommend the following: Assist for transportation;A little help with bathing/dressing/bathroom   Can travel by private vehicle        Equipment Recommendations None recommended by PT  Recommendations for Other Services       Functional Status Assessment Patient has had a recent decline in their functional status and demonstrates the ability to make significant improvements in function in a reasonable and predictable amount of time.     Precautions / Restrictions Precautions Precautions: Fall;Cervical Precaution Booklet Issued: Yes (comment) Precaution Comments: Reviewed precautions Required Braces or Orthoses: Cervical Brace Cervical Brace: Hard collar (Pt reports surgeon suggest wearing neck collar for comfort only) Restrictions Weight Bearing Restrictions: No      Mobility  Bed Mobility                    Transfers Overall transfer level: Modified independent                      Ambulation/Gait Ambulation/Gait assistance: Independent Gait Distance (Feet): 200 Feet Assistive device: None Gait Pattern/deviations: Step-through pattern Gait velocity: functional Gait velocity interpretation: >2.62 ft/sec, indicative of community  ambulatory      Stairs            Wheelchair Mobility     Tilt Bed    Modified Rankin (Stroke Patients Only)       Balance                                             Pertinent Vitals/Pain Pain Assessment Pain Assessment: 0-10 Pain Score: 7  Pain Location: Incisional Pain Descriptors / Indicators: Operative site guarding Pain Intervention(s): Monitored during session    Home Living Family/patient expects to be discharged to:: Private residence Living Arrangements: Spouse/significant other Available Help at Discharge: Family;Available 24 hours/day Type of Home: House Home Access: Level entry       Home Layout: Multi-level;Able to live on main level with bedroom/bathroom Home Equipment: None      Prior Function Prior Level of Function : Independent/Modified Independent;Driving                     Extremity/Trunk Assessment   Upper Extremity Assessment Upper Extremity Assessment: Defer to OT evaluation    Lower Extremity Assessment Lower Extremity Assessment: Overall WFL for tasks assessed    Cervical / Trunk Assessment Cervical / Trunk Assessment: Neck Surgery  Communication   Communication Communication: No apparent difficulties  Cognition Arousal: Alert Behavior During Therapy: WFL for tasks assessed/performed Overall Cognitive Status: Within Functional Limits for tasks assessed  General Comments      Exercises     Assessment/Plan    PT Assessment Patient does not need any further PT services  PT Problem List         PT Treatment Interventions      PT Goals (Current goals can be found in the Care Plan section)       Frequency       Co-evaluation               AM-PAC PT "6 Clicks" Mobility  Outcome Measure Help needed turning from your back to your side while in a flat bed without using bedrails?: None Help needed moving from lying  on your back to sitting on the side of a flat bed without using bedrails?: None Help needed moving to and from a bed to a chair (including a wheelchair)?: None Help needed standing up from a chair using your arms (e.g., wheelchair or bedside chair)?: None Help needed to walk in hospital room?: None Help needed climbing 3-5 steps with a railing? : None 6 Click Score: 24    End of Session   Activity Tolerance: Patient tolerated treatment well Patient left: in bed;with call bell/phone within reach;with family/visitor present Nurse Communication: Mobility status PT Visit Diagnosis: Other abnormalities of gait and mobility (R26.89)    Time: 8413-2440 PT Time Calculation (min) (ACUTE ONLY): 8 min   Charges:   PT Evaluation $PT Eval Low Complexity: 1 Low   PT General Charges $$ ACUTE PT VISIT: 1 Visit        Caryl Comes, SPT  Acute rehab Office: [phone]  Caryl Comes 02/18/2023, 10:20 AM

## 2023-02-19 DIAGNOSIS — Z79899 Other long term (current) drug therapy: Secondary | ICD-10-CM | POA: Diagnosis not present

## 2023-02-19 DIAGNOSIS — I10 Essential (primary) hypertension: Secondary | ICD-10-CM | POA: Diagnosis not present

## 2023-02-19 DIAGNOSIS — Z7984 Long term (current) use of oral hypoglycemic drugs: Secondary | ICD-10-CM | POA: Diagnosis not present

## 2023-02-19 DIAGNOSIS — M4712 Other spondylosis with myelopathy, cervical region: Secondary | ICD-10-CM | POA: Diagnosis not present

## 2023-02-19 DIAGNOSIS — E119 Type 2 diabetes mellitus without complications: Secondary | ICD-10-CM | POA: Diagnosis not present

## 2023-02-19 LAB — GLUCOSE, CAPILLARY: Glucose-Capillary: 120 mg/dL — ABNORMAL HIGH (ref 70–99)

## 2023-02-19 NOTE — Progress Notes (Signed)
Patient discharged home with wife, patient received all of personal belongings and prescriptions for meds in patient possession. Discharge instructions reviewed with patient and wife.

## 2023-02-19 NOTE — Progress Notes (Signed)
   02/18/23 2203  BiPAP/CPAP/SIPAP  Reason BIPAP/CPAP not in use Non-compliant (pt said does not want cpap for the night)

## 2023-02-24 NOTE — Discharge Summary (Signed)
Patient ID: Adam Hahn MRN: [id number] DOB/AGE: Dec 06, 1965 57 y.o.  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 male who was admitted 02/17/2023 for operative treatment of Cervical myelopathy (HCC). Patient failed conservative treatments (please see the history and physical for the specifics) and had severe unremitting pain that affects sleep, daily activities and work/hobbies. After pre-op clearance, the patient was taken to the operating room on 02/17/2023 and underwent  Procedure(s): ANTERIOR CERVICAL DECOMPRESSION/DISCECTOMY FUSION 1 LEVEL C3-4.    Patient was given perioperative antibiotics:  Anti-infectives (From admission, onward)    Start     Dose/Rate Route Frequency Ordered Stop   02/17/23 2000  ceFAZolin (ANCEF) IVPB 1 g/50 mL premix        1 g 100 mL/hr over 30 Minutes Intravenous Every 8 hours 02/17/23 1453 02/18/23 1356   02/17/23 0553  ceFAZolin (ANCEF) IVPB 3g/100 mL premix        3 g 200 mL/hr over 30 Minutes Intravenous 30 min pre-op 02/17/23 0553 02/17/23  1144        Patient was given sequential compression devices and early ambulation to prevent DVT.   Patient benefited maximally from hospital stay and there were no complications. At the time of discharge, the patient was urinating/moving their bowels without difficulty, tolerating a regular diet, pain is controlled with oral pain medications and they have been cleared by PT/OT.   Recent vital signs: No data found.   Recent laboratory studies: No results for input(s): "WBC", "HGB", "HCT", "PLT", "NA", "K", "CL", "CO2", "BUN", "CREATININE", "GLUCOSE", "INR", "CALCIUM" in the last 72 hours.  Invalid input(s): "PT", "2"   Discharge Medications:   Allergies as of 02/19/2023       Reactions   Indomethacin Other (See Comments)   Lotrel [amlodipine Besy-benazepril Hcl] Other (See Comments)   lethargic   Tomato Hives   Ace Inhibitors Rash   Hydrocodone Hives, Rash   Lisinopril Rash   Losartan Potassium Rash   Valsartan Rash        Medication List     STOP taking these medications    Azelastine-Fluticasone 137-50 MCG/ACT Susp   benzonatate 100 MG capsule Commonly known as: TESSALON   cetirizine 10 MG tablet Commonly known as: ZyrTEC Allergy   Ozempic (2 MG/DOSE) 8 MG/3ML Sopn Generic drug: Semaglutide (2 MG/DOSE)   sildenafil 100 MG tablet Commonly known as: VIAGRA   Testosterone 20.25 MG/ACT (1.62%) Gel       TAKE these medications    Cholecalciferol 50 MCG (2000 UT) Tabs Take 2,000 Units by mouth  daily.   cholestyramine 4 g packet Commonly known as: QUESTRAN TAKE 1 PACKET BY MOUTH 2 TIMES DAILY.   gabapentin 100 MG capsule Commonly known as: NEURONTIN Take 200 mg by mouth 2 (two) times daily.   metFORMIN 500 MG 24 hr tablet Commonly known as: GLUCOPHAGE-XR Take 1 tablet (500 mg total) by mouth daily with breakfast.   nebivolol 2.5 MG tablet Commonly known as: BYSTOLIC Take 1 tablet (2.5 mg total) by mouth daily.   ondansetron 4 MG  tablet Commonly known as: Zofran Take 1 tablet (4 mg total) by mouth every 8 (eight) hours as needed for nausea or vomiting.   rosuvastatin 10 MG tablet Commonly known as: CRESTOR Take 10 mg by mouth daily.   verapamil 240 MG CR tablet Commonly known as: CALAN-SR Take 1 tablet (240 mg total) by mouth daily.       ASK your doctor about these medications    methocarbamol 500 MG tablet Commonly known as: ROBAXIN Take 1 tablet (500 mg total) by mouth every 8 (eight) hours as needed for up to 5 days for muscle spasms. Ask about: Should I take this medication?   oxyCODONE-acetaminophen 10-325 MG tablet Commonly known as: Percocet Take 1 tablet by mouth every 6 (six) hours as needed for up to 5 days for pain. Ask about: Should I take this medication?        Diagnostic Studies: DG Cervical Spine 2 or 3 views  Result Date: 02/17/2023 CLINICAL DATA:  Elective surgery. EXAM: CERVICAL SPINE - 2-3 VIEW COMPARISON:  None Available. FINDINGS: Two fluoroscopic spot views of the cervical spine obtained in the operating room. Fusion hardware at C3-C4. Fluoroscopy time 1 minutes 27 seconds. Dose 12.69 mGy. IMPRESSION: Intraoperative fluoroscopy during cervical spine fusion. Electronically Signed   By: Narda Rutherford M.D.   On: 02/17/2023 15:38   DG C-Arm 1-60 Min-No Report  Result Date: 02/17/2023 Fluoroscopy was utilized by the requesting physician.  No radiographic interpretation.   DG C-Arm 1-60 Min-No Report  Result Date: 02/17/2023 Fluoroscopy was utilized by the requesting physician.  No radiographic interpretation.   DG C-Arm 1-60 Min-No Report  Result Date: 02/17/2023 Fluoroscopy was utilized by the requesting physician.  No radiographic interpretation.   DG C-Arm 1-60 Min-No Report  Result Date: 02/17/2023 Fluoroscopy was utilized by the requesting physician.  No radiographic interpretation.    Discharge Instructions     Incentive spirometry RT   Complete by: As  directed         Follow-up Information     Venita Lick, MD. Schedule an appointment as soon as possible for a visit in 2 week(s).   Specialty: Orthopedic Surgery Why: If symptoms worsen, For suture removal, For wound re-check Contact information: [address] [place] [address] [phone]                 Discharge Plan:  discharge to home  Disposition: Alwaleed is a very pleasant 57 year old gentleman with cervical spondylitic myelopathy who is admitted for an anterior cervical discectomy and fusion at C3-4.  Postoperatively the patient remained intact.  There was no significant swelling or angioedema causing airway compromise.  He was tolerating a regular diet and liquids.  Incision was clean dry and intact.  Patiently discharged to home with appropriate instructions and medications.  They will follow-up with me in 2 weeks.    Signed: Alvy Beal for Dr. Venita Lick [facility] [phone] 02/24/2023, 7:28 AM

## 2023-02-28 ENCOUNTER — Other Ambulatory Visit: Payer: Self-pay | Admitting: Internal Medicine

## 2023-03-31 ENCOUNTER — Other Ambulatory Visit: Payer: Self-pay | Admitting: Internal Medicine

## 2023-03-31 DIAGNOSIS — I10 Essential (primary) hypertension: Secondary | ICD-10-CM

## 2023-04-11 DIAGNOSIS — M542 Cervicalgia: Secondary | ICD-10-CM | POA: Diagnosis not present

## 2023-04-13 LAB — HEMOGLOBIN A1C
EGFR: 88
Hemoglobin A1C: 6
TSH: 1.91 (ref 0.41–5.90)

## 2023-04-15 ENCOUNTER — Ambulatory Visit (INDEPENDENT_AMBULATORY_CARE_PROVIDER_SITE_OTHER): Payer: BC Managed Care – PPO | Admitting: Internal Medicine

## 2023-04-15 ENCOUNTER — Encounter: Payer: Self-pay | Admitting: Internal Medicine

## 2023-04-15 VITALS — BP 146/93 | HR 82 | Ht 73.0 in | Wt 279.0 lb

## 2023-04-15 DIAGNOSIS — R61 Generalized hyperhidrosis: Secondary | ICD-10-CM

## 2023-04-15 DIAGNOSIS — D539 Nutritional anemia, unspecified: Secondary | ICD-10-CM | POA: Diagnosis not present

## 2023-04-15 DIAGNOSIS — Z9889 Other specified postprocedural states: Secondary | ICD-10-CM | POA: Diagnosis not present

## 2023-04-15 DIAGNOSIS — E559 Vitamin D deficiency, unspecified: Secondary | ICD-10-CM | POA: Diagnosis not present

## 2023-04-15 NOTE — Progress Notes (Signed)
Acute Office Visit  Subjective:     Patient ID: Adam Hahn, male    DOB: Apr 09, 1966, 57 y.o.   MRN: [id number]  Chief Complaint  Patient presents with   Night Sweats    Night sweats or cold chills since neck infusion    Mr. Kichline presents today for an acute visit endorsing a recent history of night sweats, fatigue, balance issues, and paresthesias in all extremities.  He reports that symptoms began after he underwent C3-C4 ACDF in October in the setting of cervical myelopathy.  Mr. Bronner initially believed these were postoperative symptoms that would gradually improve with time.  He is now concerned because there has been no resolution.  He was seen by his surgeon last week (Dr. Shon Baton).  I cannot see documentation of this encounter, however the patient reports that he was told things should improve soon.  Mr. Lauersdorf is unaware of any recent medication changes that have occurred since surgery.  Review of Systems  Constitutional:  Positive for diaphoresis ("Cold sweats") and malaise/fatigue.  Neurological:  Positive for tingling (Numbness/tingling in all extremities).  All other systems reviewed and are negative.     Objective:    BP (!) 146/93 (BP Location: Right Arm, Patient Position: Sitting, Cuff Size: Large)   Pulse 82   Ht 6\' 1"  (1.854 m)   Wt 279 lb (126.6 kg)   SpO2 97%   BMI 36.81 kg/m   Physical Exam Vitals reviewed.  Constitutional:      General: He is not in acute distress.    Appearance: Normal appearance. He is obese. He is not ill-appearing.  HENT:     Head: Normocephalic and atraumatic.     Right Ear: External ear normal.     Left Ear: External ear normal.     Nose: Nose normal. No congestion or rhinorrhea.     Mouth/Throat:     Mouth: Mucous membranes are moist.     Pharynx: Oropharynx is clear.  Eyes:     General: No scleral icterus.    Extraocular Movements: Extraocular movements intact.     Conjunctiva/sclera: Conjunctivae normal.      Pupils: Pupils are equal, round, and reactive to light.  Cardiovascular:     Rate and Rhythm: Normal rate and regular rhythm.     Pulses: Normal pulses.     Heart sounds: Normal heart sounds. No murmur heard. Pulmonary:     Effort: Pulmonary effort is normal.     Breath sounds: Normal breath sounds. No wheezing, rhonchi or rales.  Abdominal:     General: Abdomen is flat. Bowel sounds are normal. There is no distension.     Palpations: Abdomen is soft.     Tenderness: There is no abdominal tenderness.  Musculoskeletal:        General: No swelling or deformity. Normal range of motion.     Cervical back: Normal range of motion.  Skin:    General: Skin is warm and dry.     Capillary Refill: Capillary refill takes less than 2 seconds.  Neurological:     General: No focal deficit present.     Mental Status: He is alert and oriented to person, place, and time.     Motor: No weakness.  Psychiatric:        Mood and Affect: Mood normal.        Behavior: Behavior normal.        Thought Content: Thought content normal.  Assessment & Plan:   Problem List Items Addressed This Visit       Unexplained night sweats - Primary   Presenting today for an acute visit endorsing persistence of multiple symptoms, including "cold sweats" since undergoing ACDF in October.  Recently seen by his surgeon for follow-up and was told that symptoms should improve soon.  No concerning findings on exam today. -Repeat labs ordered today to screen for metabolic etiology to explain his symptoms.  ECG was also obtained in office today and shows normal sinus rhythm.  Further management pending results.  He is scheduled for follow-up with Dr. Allena Katz on 1/7.      Return if symptoms worsen or fail to improve.  Billie Lade, MD

## 2023-04-15 NOTE — Patient Instructions (Signed)
It was a pleasure to see you today.  Thank you for giving [place] the opportunity to be involved in your care.  Below is a brief recap of your visit and next steps.  We will plan to see you again in January 7th.  Summary We will check bloodwork today assess for metabolic causes to explain your symptoms.  Follow up with Dr. Allena Katz as scheduled on 05/09/22

## 2023-04-15 NOTE — Assessment & Plan Note (Signed)
Presenting today for an acute visit endorsing persistence of multiple symptoms, including "cold sweats" since undergoing ACDF in October.  Recently seen by his surgeon for follow-up and was told that symptoms should improve soon.  No concerning findings on exam today. -Repeat labs ordered today to screen for metabolic etiology to explain his symptoms.  ECG was also obtained in office today and shows normal sinus rhythm.  Further management pending results.  He is scheduled for follow-up with Dr. Allena Katz on 1/7.

## 2023-04-19 LAB — IRON,TIBC AND FERRITIN PANEL
Ferritin: 523 ng/mL — ABNORMAL HIGH (ref 30–400)
Iron Saturation: 35 % (ref 15–55)
Iron: 113 ug/dL (ref 38–169)
Total Iron Binding Capacity: 322 ug/dL (ref 250–450)
UIBC: 209 ug/dL (ref 111–343)

## 2023-04-19 LAB — CBC WITH DIFFERENTIAL/PLATELET
Basophils Absolute: 0 10*3/uL (ref 0.0–0.2)
Basos: 1 %
EOS (ABSOLUTE): 0.4 10*3/uL (ref 0.0–0.4)
Eos: 6 %
Hematocrit: 49.5 % (ref 37.5–51.0)
Hemoglobin: 16.8 g/dL (ref 13.0–17.7)
Immature Grans (Abs): 0 10*3/uL (ref 0.0–0.1)
Immature Granulocytes: 0 %
Lymphocytes Absolute: 2.1 10*3/uL (ref 0.7–3.1)
Lymphs: 35 %
MCH: 29.8 pg (ref 26.6–33.0)
MCHC: 33.9 g/dL (ref 31.5–35.7)
MCV: 88 fL (ref 79–97)
Monocytes Absolute: 0.4 10*3/uL (ref 0.1–0.9)
Monocytes: 6 %
Neutrophils Absolute: 3.2 10*3/uL (ref 1.4–7.0)
Neutrophils: 52 %
Platelets: 308 10*3/uL (ref 150–450)
RBC: 5.63 x10E6/uL (ref 4.14–5.80)
RDW: 12.3 % (ref 11.6–15.4)
WBC: 6.2 10*3/uL (ref 3.4–10.8)

## 2023-04-19 LAB — CMP14+EGFR
ALT: 49 IU/L — ABNORMAL HIGH (ref 0–44)
AST: 30 IU/L (ref 0–40)
Albumin: 4.6 g/dL (ref 3.8–4.9)
Alkaline Phosphatase: 78 IU/L (ref 44–121)
BUN/Creatinine Ratio: 9 (ref 9–20)
BUN: 10 mg/dL (ref 6–24)
Bilirubin Total: 0.3 mg/dL (ref 0.0–1.2)
CO2: 21 mmol/L (ref 20–29)
Calcium: 10.4 mg/dL — ABNORMAL HIGH (ref 8.7–10.2)
Chloride: 102 mmol/L (ref 96–106)
Creatinine, Ser: 1.06 mg/dL (ref 0.76–1.27)
Globulin, Total: 3.1 g/dL (ref 1.5–4.5)
Glucose: 88 mg/dL (ref 70–99)
Potassium: 4.4 mmol/L (ref 3.5–5.2)
Sodium: 143 mmol/L (ref 134–144)
Total Protein: 7.7 g/dL (ref 6.0–8.5)
eGFR: 83 mL/min/{1.73_m2} (ref >59)

## 2023-04-19 LAB — B12 AND FOLATE PANEL
Folate: 16.5 ng/mL (ref >3.0)
Vitamin B-12: 781 pg/mL (ref 232–1245)

## 2023-04-19 LAB — TSH+FREE T4
Free T4: 1.49 ng/dL (ref 0.82–1.77)
TSH: 0.606 u[IU]/mL (ref 0.450–4.500)

## 2023-04-19 LAB — SEDIMENTATION RATE: Sed Rate: 5 mm/h (ref 0–30)

## 2023-04-19 LAB — C-REACTIVE PROTEIN: CRP: <1 mg/L (ref 0–10)

## 2023-04-19 LAB — VITAMIN D 25 HYDROXY (VIT D DEFICIENCY, FRACTURES): Vit D, 25-Hydroxy: 36.5 ng/mL (ref 30.0–100.0)

## 2023-05-10 ENCOUNTER — Encounter: Payer: Self-pay | Admitting: Internal Medicine

## 2023-05-10 ENCOUNTER — Ambulatory Visit (INDEPENDENT_AMBULATORY_CARE_PROVIDER_SITE_OTHER): Payer: BC Managed Care – PPO | Admitting: Internal Medicine

## 2023-05-10 ENCOUNTER — Other Ambulatory Visit: Payer: Self-pay | Admitting: Internal Medicine

## 2023-05-10 VITALS — BP 120/79 | HR 104 | Ht 73.0 in | Wt 280.0 lb

## 2023-05-10 DIAGNOSIS — Z0001 Encounter for general adult medical examination with abnormal findings: Secondary | ICD-10-CM

## 2023-05-10 DIAGNOSIS — E1169 Type 2 diabetes mellitus with other specified complication: Secondary | ICD-10-CM | POA: Diagnosis not present

## 2023-05-10 DIAGNOSIS — G4733 Obstructive sleep apnea (adult) (pediatric): Secondary | ICD-10-CM

## 2023-05-10 DIAGNOSIS — Z7985 Long-term (current) use of injectable non-insulin antidiabetic drugs: Secondary | ICD-10-CM

## 2023-05-10 DIAGNOSIS — Z7984 Long term (current) use of oral hypoglycemic drugs: Secondary | ICD-10-CM

## 2023-05-10 DIAGNOSIS — I471 Supraventricular tachycardia, unspecified: Secondary | ICD-10-CM

## 2023-05-10 DIAGNOSIS — N529 Male erectile dysfunction, unspecified: Secondary | ICD-10-CM

## 2023-05-10 DIAGNOSIS — I10 Essential (primary) hypertension: Secondary | ICD-10-CM

## 2023-05-10 DIAGNOSIS — G959 Disease of spinal cord, unspecified: Secondary | ICD-10-CM

## 2023-05-10 MED ORDER — TADALAFIL 20 MG PO TABS: 10.0000 mg | ORAL_TABLET | ORAL | 1 refills | Status: DC | PRN

## 2023-05-10 MED ORDER — GABAPENTIN 300 MG PO CAPS: 300.0000 mg | ORAL_CAPSULE | Freq: Two times a day (BID) | ORAL | 3 refills | Status: DC

## 2023-05-10 NOTE — Progress Notes (Signed)
 Established Patient Office Visit  Subjective:  Patient ID: Adam Hahn, male    DOB: Sep 17, 1965  Age: 58 y.o. MRN: [id number]  CC:  Chief Complaint  Patient presents with   Annual Exam    CPE    HPI Adam Hahn is a 58 y.o. male with past medical history of HTN, DM and OA who presents for annual physical.  HTN: BP is wnl now. Takes medications regularly. Patient denies headache, dizziness, chest pain, dyspnea or palpitations.  Type II DM with HLD: His HbA1c was 6.0 in 12/24.  He has been taking metformin  500 mg QD and Ozempic  2 mg qw currently.  He also takes Crestor  for HLD.  His blood glucose ranges around 100 at home.  He currently denies any polyuria, polydipsia or polyphagia.  He has has been gaining weight due to lack of physical activity and due to constant back and knee pain.  He uses CPAP regularly for OSA.  He recently had sleep study at [place], and was found to have episode of SVT overnight.  He had cardiac monitor and saw cardiologist at [facility], but had sinus tachycardia. He is taking verapamil  and Bystolic  currently.  He reports erectile dysfunction, he takes sildenafil 100 mg as needed.  He had started using testosterone  gel for testosterone  deficiency and his testosterone  level had slightly improved in 03/24.  But he was told to stop testosterone  by his [place] urologist as they prefer to check estradiol level as well.  Denies any dysuria, hematuria, urethral discharge, urinary hesitancy or resistance currently.  Cervical and and lumbar radiculopathy: He has chronic neck and back pain, radiating to UE and LE.  He has right UE numbness as well.  He has seen Dr Burnetta at [facility], had anterior cervical decompression/discectomy fusion C3-C4 in 10/24.  He had worsening of UE numbness and weakness initially, but has been improving slowly.  He is currently taking gabapentin  200 mg twice daily.  Reports insomnia due to neck pain.  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   OSA on CPAP    S/P lateral meniscectomy of left knee 06/03/2011   Shoulder impingement, right    Type 2 diabetes mellitus (HCC)    followed by pcp   Wears glasses     Past Surgical History:  Procedure Laterality Date   ANTERIOR CERVICAL DECOMP/DISCECTOMY FUSION N/A 02/17/2023   Procedure: ANTERIOR CERVICAL DECOMPRESSION/DISCECTOMY FUSION 1 LEVEL C3-4;  Surgeon: Burnetta Aures, MD;  Location: [facility] OR;  Service: Orthopedics;  Laterality: N/A;  3 hrs 3 C-Bed   COLONOSCOPY WITH PROPOFOL  N/A 08/13/2020   Procedure: COLONOSCOPY WITH PROPOFOL ;  Surgeon: Eartha Angelia Sieving, MD;  Location: [facility];  Service: Gastroenterology;  Laterality: N/A;  am   KNEE SURGERY Left x4  last one 1996   LAPAROSCOPIC CHOLECYSTECTOMY  09-20-2007  @[facility]    POLYPECTOMY  08/13/2020   Procedure: POLYPECTOMY;  Surgeon: Eartha Angelia Sieving, MD;  Location: [facility];  Service: Gastroenterology;;   ROTATOR CUFF REPAIR Left 2022   SHOULDER ARTHROSCOPY WITH ROTATOR CUFF REPAIR Right 10/17/2018   Procedure: Right shoulder arthroscopy with subacromial decompression, distal clavicle resection,  rotator cuff DEBRIEDMENT, LABRIAL DEBRIDMENT;  Surgeon: Melita Drivers,  MD;  Location: [facility];  Service: Orthopedics;  Laterality: Right;     Family History  Problem Relation Age of Onset   Hypertension Father    Asthma Mother    Allergic rhinitis Neg Hx    Angioedema Neg Hx    Atopy Neg Hx    Eczema Neg Hx    Immunodeficiency Neg Hx    Urticaria Neg Hx     Social History   Socioeconomic History   Marital status: Married    Spouse name: Not on file   Number of children: Not on file   Years of education: Not on file   Highest  education level: Bachelor's degree (e.g., BA, AB, BS)  Occupational History   Not on file  Tobacco Use   Smoking status: Never   Smokeless tobacco: Never  Vaping Use   Vaping status: Never Used  Substance and Sexual Activity   Alcohol use: Yes    Comment: occasionally   Drug use: Never   Sexual activity: Yes  Other Topics Concern   Not on file  Social History Narrative   Not on file   Social Drivers of Health   Financial Resource Strain: Low Risk  (05/09/2023)   Overall Financial Resource Strain (CARDIA)    Difficulty of Paying Living Expenses: Not hard at all  Food Insecurity: No Food Insecurity (05/09/2023)   Hunger Vital Sign    Worried About Running Out of Food in the Last Year: Never true    Ran Out of Food in the Last Year: Never true  Transportation Needs: No Transportation Needs (05/09/2023)   PRAPARE - Administrator, Civil Service (Medical): No    Lack of Transportation (Non-Medical): No  Physical Activity: Insufficiently Active (05/09/2023)   Exercise Vital Sign    Days of Exercise per Week: 2 days    Minutes of Exercise per Session: 30 min  Stress: No Stress Concern Present (05/09/2023)   [employer] of Occupational Health - Occupational Stress Questionnaire    Feeling of Stress : Not at all  Social Connections: Unknown (05/09/2023)   Social Connection and Isolation Panel [NHANES]    Frequency of Communication with Friends and Family: More than three times a week    Frequency of Social Gatherings with Friends and Family: Twice a week    Attends Religious Services: Patient declined    Database Administrator or Organizations: No    Attends Engineer, Structural: Not on file    Marital Status: Married  Intimate Partner Violence: Unknown (08/07/2021)   Received from [employer], [facility]   HITS    Physically Hurt: Not on file    Insult or Talk Down To: Not on file    Threaten Physical Harm: Not on file    Scream or Curse: Not on file     Outpatient Medications Prior to Visit  Medication Sig Dispense Refill   celecoxib  (CELEBREX ) 200 MG capsule Take 200 mg by mouth daily.     Cholecalciferol 50 MCG (2000 UT) TABS Take 2,000 Units by mouth daily.     cholestyramine  (QUESTRAN ) 4 g packet TAKE 1 PACKET BY MOUTH 2 TIMES DAILY. 60 packet 0   metFORMIN  (GLUCOPHAGE -XR) 500 MG 24 hr tablet Take 1 tablet (500 mg total) by mouth daily with breakfast. 30 tablet 5   nebivolol  (BYSTOLIC ) 2.5 MG tablet Take 1 tablet (2.5 mg total) by mouth daily. 30 tablet 5   Oxycodone  HCl 10 MG  TABS Take 10 mg by mouth 3 (three) times daily as needed.     rosuvastatin  (CRESTOR ) 10 MG tablet Take 10 mg by mouth daily.     Semaglutide , 2 MG/DOSE, (OZEMPIC , 2 MG/DOSE,) 8 MG/3ML SOPN INJECT 2MG  SUBCUTANEOUSLY ONCE PER WEEK 3 mL 5   verapamil  (CALAN -SR) 240 MG CR tablet Take 1 tablet (240 mg total) by mouth daily. 30 tablet 3   gabapentin  (NEURONTIN ) 100 MG capsule Take 300 mg by mouth 2 (two) times daily.     ondansetron  (ZOFRAN ) 4 MG tablet Take 1 tablet (4 mg total) by mouth every 8 (eight) hours as needed for nausea or vomiting. 20 tablet 0   No facility-administered medications prior to visit.    Allergies  Allergen Reactions   Indomethacin  Other (See Comments)   Lotrel [Amlodipine Besy-Benazepril Hcl] Other (See Comments)    lethargic   Tomato Hives   Ace Inhibitors Rash   Hydrocodone Hives and Rash   Lisinopril Rash   Losartan Potassium Rash   Valsartan  Rash    ROS Review of Systems  Constitutional:  Negative for chills and fever.  HENT:  Negative for congestion and sore throat.   Eyes:  Negative for pain and discharge.  Respiratory:  Negative for cough and shortness of breath.   Cardiovascular:  Negative for chest pain and palpitations.  Gastrointestinal:  Negative for diarrhea, nausea and vomiting.  Endocrine: Negative for polydipsia and polyuria.  Genitourinary:  Negative for dysuria and hematuria.  Musculoskeletal:  Positive  for arthralgias and neck pain. Negative for neck stiffness.  Skin:  Negative for rash.  Neurological:  Positive for numbness (R arm). Negative for dizziness, weakness and headaches.  Psychiatric/Behavioral:  Negative for agitation and behavioral problems.       Objective:    Physical Exam Vitals reviewed.  Constitutional:      General: He is not in acute distress.    Appearance: He is obese. He is not diaphoretic.  HENT:     Head: Normocephalic and atraumatic.     Nose: Nose normal.     Mouth/Throat:     Mouth: Mucous membranes are moist.  Eyes:     General: No scleral icterus.    Extraocular Movements: Extraocular movements intact.  Neck:     Comments: Anterior neck incision scar - C/D/I Cardiovascular:     Rate and Rhythm: Normal rate and regular rhythm.     Pulses: Normal pulses.     Heart sounds: Normal heart sounds. No murmur heard. Pulmonary:     Breath sounds: Normal breath sounds. No wheezing or rales.  Abdominal:     Palpations: Abdomen is soft.     Tenderness: There is no abdominal tenderness.  Musculoskeletal:        General: Swelling (B/l knee, mild) present.     Cervical back: Neck supple. Tenderness present.     Right lower leg: No edema.     Left lower leg: No edema.     Comments: Cystic structure noted over left hand  Skin:    General: Skin is warm.     Findings: No rash.  Neurological:     General: No focal deficit present.     Mental Status: He is alert and oriented to person, place, and time.     Cranial Nerves: No cranial nerve deficit.     Sensory: No sensory deficit.     Motor: No weakness.  Psychiatric:        Mood and Affect: Mood normal.  Behavior: Behavior normal.     BP 120/79 (BP Location: Right Arm, Patient Position: Sitting, Cuff Size: Large)   Pulse (!) 104   Ht 6' 1 (1.854 m)   Wt 280 lb (127 kg)   SpO2 95%   BMI 36.94 kg/m  Wt Readings from Last 3 Encounters:  05/10/23 280 lb (127 kg)  04/15/23 279 lb (126.6 kg)   02/17/23 262 lb (118.8 kg)    Lab Results  Component Value Date   TSH 0.606 04/15/2023   Lab Results  Component Value Date   WBC 6.2 04/15/2023   HGB 16.8 04/15/2023   HCT 49.5 04/15/2023   MCV 88 04/15/2023   PLT 308 04/15/2023   Lab Results  Component Value Date   NA 143 04/15/2023   K 4.4 04/15/2023   CO2 21 04/15/2023   GLUCOSE 88 04/15/2023   BUN 10 04/15/2023   CREATININE 1.06 04/15/2023   BILITOT 0.3 04/15/2023   ALKPHOS 78 04/15/2023   AST 30 04/15/2023   ALT 49 (H) 04/15/2023   PROT 7.7 04/15/2023   ALBUMIN 4.6 04/15/2023   CALCIUM  10.4 (H) 04/15/2023   ANIONGAP 13 02/04/2023   EGFR 83 04/15/2023   Lab Results  Component Value Date   CHOL 137 03/09/2022   Lab Results  Component Value Date   HDL 56 03/09/2022   Lab Results  Component Value Date   LDLCALC 66 03/09/2022   Lab Results  Component Value Date   TRIG 74 03/09/2022   Lab Results  Component Value Date   CHOLHDL 2.4 03/09/2022   Lab Results  Component Value Date   HGBA1C 6.0 04/13/2023      Assessment & Plan:   Problem List Items Addressed This Visit       Cardiovascular and Mediastinum   HTN (hypertension)   BP Readings from Last 1 Encounters:  05/10/23 120/79   Well-controlled with Verapamil  240 mg once daily (from [place]) and Bystolic  considering recent PSVT Allergic to ACEi/ARB Counseled for compliance with the medications Advised DASH diet and moderate exercise/walking, at least 150 mins/week      PSVT (paroxysmal supraventricular tachycardia) (HCC)   Noted [place] ([place]) Had Holter monitor and see cardiologist at [facility] - on Bystolic  2.5 mg QD        Respiratory   Obstructive sleep apnea   Uses CPAP regularly, continues to benefit from it        Endocrine   Type 2 diabetes mellitus with other specified complication (HCC)   Lab Results  Component Value Date   HGBA1C 6.0 04/13/2023   Associated with HTN, OSA and HLD Well controlled On Metformin  500 mg QD  and Ozempic  2 mg qw Advised to follow diabetic diet Reviewed CMP and lipid panel Diabetic eye exam: Advised to follow up with Ophthalmology for diabetic eye exam        Nervous and Auditory   Cervical myelopathy (HCC)   S/p cervical fusion - C3-4 surgery (10/24) Followed by Spine surgery Increased dose of gabapentin  to 300 mg twice daily due to recent worsening of neuropathic symptoms Takes Celebrex  as needed for pain as well      Relevant Medications   gabapentin  (NEURONTIN ) 300 MG capsule     Other   Encounter for general adult medical examination with abnormal findings - Primary   Physical exam as documented. Counseling done  re healthy lifestyle involving commitment to 150 minutes exercise per week, heart healthy diet, and attaining healthy weight.The importance  of adequate sleep also discussed. Immunization and cancer screening needs are specifically addressed at this visit.  Recent blood tests reviewed from [place] clinic.        Meds ordered this encounter  Medications   gabapentin  (NEURONTIN ) 300 MG capsule    Sig: Take 1 capsule (300 mg total) by mouth 2 (two) times daily.    Dispense:  60 capsule    Refill:  3    Follow-up: Return in about 4 months (around 09/07/2023) for DM and neuropathy.    Suzzane MARLA Blanch, MD

## 2023-05-10 NOTE — Patient Instructions (Signed)
 Please start taking Gabapentin 300 mg twice daily.  Please continue to take medications as prescribed.  Please continue to follow low carb diet and perform moderate exercise/walking at least 150 mins/week.

## 2023-05-10 NOTE — Assessment & Plan Note (Signed)
 BP Readings from Last 1 Encounters:  05/10/23 120/79   Well-controlled with Verapamil  240 mg once daily (from [place]) and Bystolic  considering recent PSVT Allergic to ACEi/ARB Counseled for compliance with the medications Advised DASH diet and moderate exercise/walking, at least 150 mins/week

## 2023-05-10 NOTE — Assessment & Plan Note (Addendum)
 Physical exam as documented. Counseling done  re healthy lifestyle involving commitment to 150 minutes exercise per week, heart healthy diet, and attaining healthy weight.The importance of adequate sleep also discussed. Immunization and cancer screening needs are specifically addressed at this visit.  Recent blood tests reviewed from [place] clinic.

## 2023-05-10 NOTE — Assessment & Plan Note (Signed)
Uses CPAP regularly, continues to benefit from it 

## 2023-05-10 NOTE — Assessment & Plan Note (Addendum)
 Lab Results  Component Value Date   HGBA1C 6.0 04/13/2023   Associated with HTN, OSA and HLD Well controlled On Metformin  500 mg QD and Ozempic  2 mg qw Had diarrhea with Metformin  1000 mg dosing in the past, was discontinued in 2023 Advised to follow diabetic diet Reviewed CMP and lipid panel Diabetic eye exam: Advised to follow up with Ophthalmology for diabetic eye exam

## 2023-05-10 NOTE — Assessment & Plan Note (Signed)
 Noted on sleep study ([place]) Had Holter monitor and see cardiologist at [facility] - on Bystolic 2.5 mg QD

## 2023-05-10 NOTE — Assessment & Plan Note (Addendum)
 S/p cervical fusion - C3-4 surgery (10/24) Followed by Spine surgery Increased dose of gabapentin to 300 mg twice daily due to recent worsening of neuropathic symptoms Takes Celebrex as needed for pain as well

## 2023-05-23 NOTE — Telephone Encounter (Signed)
Prior auth has been started

## 2023-05-26 DIAGNOSIS — Z4789 Encounter for other orthopedic aftercare: Secondary | ICD-10-CM | POA: Diagnosis not present

## 2023-07-11 ENCOUNTER — Other Ambulatory Visit: Payer: Self-pay | Admitting: Internal Medicine

## 2023-07-11 DIAGNOSIS — N529 Male erectile dysfunction, unspecified: Secondary | ICD-10-CM

## 2023-07-18 ENCOUNTER — Telehealth: Payer: Self-pay | Admitting: Pharmacy Technician

## 2023-07-18 NOTE — Telephone Encounter (Signed)
 Pharmacy Patient Advocate Encounter   Received notification from Onbase that prior authorization for [facility] is required/requested.   Insurance verification completed.   The patient is insured through  [employer]  .   Per test claim: PA required; PA submitted to above mentioned insurance via Fax Key/confirmation #/EOC   Status is pending

## 2023-07-19 ENCOUNTER — Other Ambulatory Visit (HOSPITAL_COMMUNITY): Payer: Self-pay

## 2023-07-19 NOTE — Telephone Encounter (Signed)
 Pharmacy Patient Advocate Encounter  Received notification from  [employer]  that Prior Authorization for [facility] 2MG /DOSE has been APPROVED from 07/18/2023 to 07/17/2024   PA #/Case ID/Reference #: [id number]

## 2023-08-12 ENCOUNTER — Other Ambulatory Visit: Payer: Self-pay | Admitting: Internal Medicine

## 2023-08-23 DIAGNOSIS — G8918 Other acute postprocedural pain: Secondary | ICD-10-CM | POA: Diagnosis not present

## 2023-09-07 ENCOUNTER — Ambulatory Visit: Payer: BC Managed Care – PPO | Admitting: Internal Medicine

## 2023-09-07 ENCOUNTER — Encounter: Payer: Self-pay | Admitting: Internal Medicine

## 2023-09-07 VITALS — BP 131/76 | HR 95 | Ht 73.0 in | Wt 265.8 lb

## 2023-09-07 DIAGNOSIS — I10 Essential (primary) hypertension: Secondary | ICD-10-CM

## 2023-09-07 DIAGNOSIS — E1169 Type 2 diabetes mellitus with other specified complication: Secondary | ICD-10-CM | POA: Diagnosis not present

## 2023-09-07 DIAGNOSIS — G4733 Obstructive sleep apnea (adult) (pediatric): Secondary | ICD-10-CM | POA: Diagnosis not present

## 2023-09-07 DIAGNOSIS — M5030 Other cervical disc degeneration, unspecified cervical region: Secondary | ICD-10-CM

## 2023-09-07 DIAGNOSIS — I471 Supraventricular tachycardia, unspecified: Secondary | ICD-10-CM

## 2023-09-07 DIAGNOSIS — E782 Mixed hyperlipidemia: Secondary | ICD-10-CM

## 2023-09-07 NOTE — Assessment & Plan Note (Signed)
 Noted [place] study ([facility]) Had Holter monitor and see cardiologist at [facility] - on Bystolic  2.5 mg once daily, advised to ask about Zebeta to [place] provider if he can get it through [place] pharmacy

## 2023-09-07 NOTE — Assessment & Plan Note (Signed)
 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 

## 2023-09-07 NOTE — Assessment & Plan Note (Signed)
 BP Readings from Last 1 Encounters:  09/07/23 131/76   Well-controlled with Verapamil  240 mg once daily (from [place]) and Bystolic  considering recent PSVT Allergic to ACEi/ARB Counseled for compliance with the medications Advised DASH diet and moderate exercise/walking, at least 150 mins/week

## 2023-09-07 NOTE — Patient Instructions (Addendum)
 [facility] if [facility] can provide Zebeta (Bisoprolol).  Please continue to take medications as prescribed.  Please continue to follow low carb diet and perform moderate exercise/walking at least 150 mins/week.

## 2023-09-07 NOTE — Assessment & Plan Note (Signed)
On Crestor Checked lipid profile 

## 2023-09-07 NOTE — Progress Notes (Signed)
 Established Patient Office Visit  Subjective:  Patient ID: Adam Hahn, male    DOB: 1965-08-12  Age: 58 y.o. MRN: [id number]  CC:  Chief Complaint  Patient presents with   Medical Management of Chronic Issues    4 month f/u    HPI Adam Hahn is a 58 y.o. male with past medical history of HTN, DM and OA who presents for f/u of his chronic medical conditions.  HTN: BP is wnl now. Takes medications regularly. Patient denies headache, dizziness, chest pain, dyspnea or palpitations.  Type II DM with HLD: His HbA1c was 6.0 in 12/24.  He has been taking metformin  500 mg QD and Ozempic  2 mg qw currently.  He also takes Crestor  for HLD.  His blood glucose ranges around 100 at home.  He currently denies any polyuria, polydipsia or polyphagia.  He has lost about 15 lbs since the last visit.  He uses CPAP regularly for OSA.  He had sleep study at [place], and was found to have episode of SVT overnight.  He had cardiac monitor and saw cardiologist at [facility], but had sinus tachycardia. He is taking verapamil  and Bystolic  currently. He asks for an alternative to Bystolic  that he can get through [place].  He reports erectile dysfunction, he takes sildenafil 100 mg as needed.  He had started using testosterone  gel for testosterone  deficiency and his testosterone  level had slightly improved in 03/24.  But he was told to stop testosterone  by his [place] urologist. Denies any dysuria, hematuria, urethral discharge, urinary hesitancy or resistance currently.  Cervical and and lumbar radiculopathy: He has chronic neck and back pain, radiating to UE and LE.  He has right UE numbness as well.  He has seen Dr Vaughn Georges at [facility], had anterior cervical decompression/discectomy fusion C3-C4 in 10/24.  He had worsening of UE numbness and weakness initially, but has been improving slowly.  He is currently taking gabapentin  300 mg twice daily.  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   OSA on CPAP    S/P lateral meniscectomy of left knee 06/03/2011   Shoulder impingement, right    Type 2 diabetes mellitus (HCC)    followed by pcp   Wears glasses     Past Surgical History:  Procedure Laterality Date   ANTERIOR CERVICAL DECOMP/DISCECTOMY FUSION N/A 02/17/2023   Procedure: ANTERIOR CERVICAL DECOMPRESSION/DISCECTOMY FUSION 1 LEVEL C3-4;  Surgeon: Mort Ards, MD;  Location: [facility] OR;  Service: Orthopedics;  Laterality: N/A;  3 hrs 3 C-Bed   COLONOSCOPY WITH PROPOFOL  N/A 08/13/2020   Procedure: COLONOSCOPY WITH PROPOFOL ;  Surgeon: Urban Garden, MD;  Location: [facility];  Service: Gastroenterology;  Laterality: N/A;  am   KNEE SURGERY Left x4  last one 1996   LAPAROSCOPIC CHOLECYSTECTOMY  09-20-2007  @[facility]    POLYPECTOMY  08/13/2020   Procedure: POLYPECTOMY;  Surgeon: Urban Garden, MD;  Location: [facility];  Service: Gastroenterology;;   ROTATOR CUFF REPAIR Left 2022   SHOULDER ARTHROSCOPY WITH ROTATOR CUFF REPAIR Right 10/17/2018   Procedure: Right shoulder arthroscopy with subacromial decompression, distal clavicle resection,  rotator cuff DEBRIEDMENT, LABRIAL DEBRIDMENT;  Surgeon: Ellard Gunning, MD;  Location: [facility];  Service: Orthopedics;  Laterality: Right;     Family History  Problem Relation Age of Onset   Hypertension Father    Asthma Mother    Allergic rhinitis Neg Hx    Angioedema Neg Hx    Atopy Neg Hx    Eczema Neg Hx    Immunodeficiency Neg Hx    Urticaria Neg Hx     Social History   Socioeconomic History   Marital status: Married    Spouse name: Not on file   Number of children: Not on file   Years of education: Not on file   Highest education level: Bachelor's degree  (e.g., BA, AB, BS)  Occupational History   Not on file  Tobacco Use   Smoking status: Never   Smokeless tobacco: Never  Vaping Use   Vaping status: Never Used  Substance and Sexual Activity   Alcohol use: Yes    Comment: occasionally   Drug use: Never   Sexual activity: Yes  Other Topics Concern   Not on file  Social History Narrative   Not on file   Social Drivers of Health   Financial Resource Strain: Low Risk  (05/09/2023)   Overall Financial Resource Strain (CARDIA)    Difficulty of Paying Living Expenses: Not hard at all  Food Insecurity: No Food Insecurity (05/09/2023)   Hunger Vital Sign    Worried About Running Out of Food in the Last Year: Never true    Ran Out of Food in the Last Year: Never true  Transportation Needs: No Transportation Needs (05/09/2023)   PRAPARE - Administrator, Civil Service (Medical): No    Lack of Transportation (Non-Medical): No  Physical Activity: Insufficiently Active (05/09/2023)   Exercise Vital Sign    Days of Exercise per Week: 2 days    Minutes of Exercise per Session: 30 min  Stress: No Stress Concern Present (05/09/2023)   [employer] of Occupational Health - Occupational Stress Questionnaire    Feeling of Stress : Not at all  Social Connections: Unknown (05/09/2023)   Social Connection and Isolation Panel [NHANES]    Frequency of Communication with Friends and Family: More than three times a week    Frequency of Social Gatherings with Friends and Family: Twice a week    Attends Religious Services: Patient declined    Database administrator or Organizations: No    Attends Engineer, structural: Not on file    Marital Status: Married  Intimate Partner Violence: Unknown (08/07/2021)   Received from [employer]   HITS    Physically Hurt: Not on file    Insult or Talk Down To: Not on file    Threaten Physical Harm: Not on file    Scream or Curse: Not on file    Outpatient Medications Prior to  Visit  Medication Sig Dispense Refill   celecoxib  (CELEBREX ) 200 MG capsule Take 200 mg by mouth daily.     Cholecalciferol 50 MCG (2000 UT) TABS Take 2,000 Units by mouth daily.     cholestyramine  (QUESTRAN ) 4 g packet TAKE 1 PACKET BY MOUTH 2 TIMES DAILY. 60 packet 0   gabapentin  (NEURONTIN ) 300 MG capsule Take 1 capsule (300 mg total) by mouth 2 (two) times daily. 60 capsule 3   metFORMIN  (GLUCOPHAGE -XR) 500 MG 24 hr tablet Take 1 tablet (500 mg total) by mouth daily with breakfast. 30 tablet 5   nebivolol  (BYSTOLIC ) 2.5 MG tablet Take 1  tablet (2.5 mg total) by mouth daily. 30 tablet 5   Oxycodone  HCl 10 MG TABS Take 10 mg by mouth 3 (three) times daily as needed.     OZEMPIC , 2 MG/DOSE, 8 MG/3ML SOPN INJECT 2MG  SUBCUTANEOUSLY ONCE PER WEEK 3 mL 5   rosuvastatin  (CRESTOR ) 10 MG tablet Take 10 mg by mouth daily.     tadalafil  (CIALIS ) 20 MG tablet Take 0.5-1 tablets (10-20 mg total) by mouth every other day as needed for erectile dysfunction. 30 tablet 1   verapamil  (CALAN -SR) 240 MG CR tablet Take 1 tablet (240 mg total) by mouth daily. 30 tablet 3   No facility-administered medications prior to visit.    Allergies  Allergen Reactions   Indomethacin  Other (See Comments)   Lotrel [Amlodipine Besy-Benazepril Hcl] Other (See Comments)    lethargic   Tomato Hives   Ace Inhibitors Rash   Hydrocodone Hives and Rash   Lisinopril Rash   Losartan Potassium Rash   Valsartan  Rash    ROS Review of Systems  Constitutional:  Negative for chills and fever.  HENT:  Negative for congestion and sore throat.   Eyes:  Negative for pain and discharge.  Respiratory:  Negative for cough and shortness of breath.   Cardiovascular:  Negative for chest pain and palpitations.  Gastrointestinal:  Negative for diarrhea, nausea and vomiting.  Endocrine: Negative for polydipsia and polyuria.  Genitourinary:  Negative for dysuria and hematuria.  Musculoskeletal:  Positive for arthralgias and neck pain.  Negative for neck stiffness.  Skin:  Negative for rash.  Neurological:  Negative for dizziness, weakness and headaches.  Psychiatric/Behavioral:  Negative for agitation and behavioral problems.       Objective:    Physical Exam Vitals reviewed.  Constitutional:      General: He is not in acute distress.    Appearance: He is obese. He is not diaphoretic.  HENT:     Head: Normocephalic and atraumatic.     Nose: Nose normal.     Mouth/Throat:     Mouth: Mucous membranes are moist.  Eyes:     General: No scleral icterus.    Extraocular Movements: Extraocular movements intact.  Neck:     Comments: Anterior neck incision scar - C/D/I Cardiovascular:     Rate and Rhythm: Normal rate and regular rhythm.     Heart sounds: Normal heart sounds. No murmur heard. Pulmonary:     Breath sounds: Normal breath sounds. No wheezing or rales.  Musculoskeletal:        General: Swelling (B/l knee, mild) present.     Cervical back: Neck supple. Tenderness present.     Right lower leg: No edema.     Left lower leg: No edema.     Comments: Cystic structure noted over left hand  Skin:    General: Skin is warm.     Findings: No rash.  Neurological:     General: No focal deficit present.     Mental Status: He is alert and oriented to person, place, and time.     Cranial Nerves: No cranial nerve deficit.     Sensory: No sensory deficit.     Motor: No weakness.  Psychiatric:        Mood and Affect: Mood normal.        Behavior: Behavior normal.     BP 131/76   Pulse 95   Ht 6\' 1"  (1.854 m)   Wt 265 lb 12.8 oz (120.6 kg)   SpO2  96%   BMI 35.07 kg/m  Wt Readings from Last 3 Encounters:  09/07/23 265 lb 12.8 oz (120.6 kg)  05/10/23 280 lb (127 kg)  04/15/23 279 lb (126.6 kg)    Lab Results  Component Value Date   TSH 0.606 04/15/2023   Lab Results  Component Value Date   WBC 6.2 04/15/2023   HGB 16.8 04/15/2023   HCT 49.5 04/15/2023   MCV 88 04/15/2023   PLT 308 04/15/2023    Lab Results  Component Value Date   NA 143 04/15/2023   K 4.4 04/15/2023   CO2 21 04/15/2023   GLUCOSE 88 04/15/2023   BUN 10 04/15/2023   CREATININE 1.06 04/15/2023   BILITOT 0.3 04/15/2023   ALKPHOS 78 04/15/2023   AST 30 04/15/2023   ALT 49 (H) 04/15/2023   PROT 7.7 04/15/2023   ALBUMIN 4.6 04/15/2023   CALCIUM  10.4 (H) 04/15/2023   ANIONGAP 13 02/04/2023   EGFR 83 04/15/2023   Lab Results  Component Value Date   CHOL 137 03/09/2022   Lab Results  Component Value Date   HDL 56 03/09/2022   Lab Results  Component Value Date   LDLCALC 66 03/09/2022   Lab Results  Component Value Date   TRIG 74 03/09/2022   Lab Results  Component Value Date   CHOLHDL 2.4 03/09/2022   Lab Results  Component Value Date   HGBA1C 6.0 04/13/2023      Assessment & Plan:   Problem List Items Addressed This Visit       Cardiovascular and Mediastinum   HTN (hypertension)   BP Readings from Last 1 Encounters:  09/07/23 131/76   Well-controlled with Verapamil  240 mg once daily (from [place]) and Bystolic  considering recent PSVT Allergic to ACEi/ARB Counseled for compliance with the medications Advised DASH diet and moderate exercise/walking, at least 150 mins/week      PSVT (paroxysmal supraventricular tachycardia) (HCC)   [place]) Had Holter monitor and see cardiologist at [facility] - on Bystolic  2.5 mg once daily, advised to ask about Zebeta to [place] provider if he can get it through [place] pharmacy        Respiratory   Obstructive sleep apnea   Uses CPAP regularly, continues to benefit from it        Endocrine   Type 2 diabetes mellitus with other specified complication (HCC) - Primary   Lab Results  Component Value Date   HGBA1C 6.0 04/13/2023   Associated with HTN, OSA and HLD Well controlled On Metformin  500 mg QD and Ozempic  2 mg qw Had diarrhea with Metformin  1000 mg dosing in the past, was discontinued in 2023 Advised to follow diabetic diet Check  HbA1c and urine microalbumin/creatinine ratio Reviewed CMP and lipid panel Diabetic eye exam: Advised to follow up with Ophthalmology for diabetic eye exam      Relevant Orders   Microalbumin / creatinine urine ratio   Bayer DCA Hb A1c Waived     Musculoskeletal and Integument   DDD (degenerative disc disease), cervical   Has disc herniation at C3-C4 with central foraminal stenosis Also has foraminal stenosis at C5-C6 Followed by [facility] On Gabapentin         Other   Hyperlipidemia   On Crestor  Checked lipid profile        No orders of the defined types were placed in this encounter.   Follow-up: Return in about 4 months (around 01/08/2024) for DM and HTN.    Ahuva Poynor K  Lydia Sams, MD

## 2023-09-07 NOTE — Assessment & Plan Note (Addendum)
 Lab Results  Component Value Date   HGBA1C 6.0 04/13/2023   Associated with HTN, OSA and HLD Well controlled On Metformin  500 mg QD and Ozempic  2 mg qw Had diarrhea with Metformin  1000 mg dosing in the past, was discontinued in 2023 Advised to follow diabetic diet Check HbA1c and urine microalbumin/creatinine ratio Reviewed CMP and lipid panel Diabetic eye exam: Advised to follow up with Ophthalmology for diabetic eye exam

## 2023-09-07 NOTE — Assessment & Plan Note (Signed)
Uses CPAP regularly, continues to benefit from it 

## 2023-09-08 LAB — BAYER DCA HB A1C WAIVED: HB A1C (BAYER DCA - WAIVED): 5.9 % — ABNORMAL HIGH (ref 4.8–5.6)

## 2023-09-09 NOTE — Telephone Encounter (Signed)
 Records faxed.

## 2023-09-10 LAB — MICROALBUMIN / CREATININE URINE RATIO
Creatinine, Urine: 110.4 mg/dL
Microalb/Creat Ratio: 4 mg/g{creat} (ref 0–29)
Microalbumin, Urine: 4 ug/mL

## 2023-09-26 ENCOUNTER — Other Ambulatory Visit: Payer: Self-pay | Admitting: Internal Medicine

## 2023-09-26 DIAGNOSIS — I10 Essential (primary) hypertension: Secondary | ICD-10-CM

## 2023-09-27 ENCOUNTER — Other Ambulatory Visit (HOSPITAL_COMMUNITY): Payer: Self-pay

## 2023-09-28 ENCOUNTER — Telehealth: Payer: Self-pay | Admitting: Pharmacy Technician

## 2023-09-28 ENCOUNTER — Other Ambulatory Visit (HOSPITAL_COMMUNITY): Payer: Self-pay

## 2023-09-28 NOTE — Telephone Encounter (Signed)
 Pharmacy Patient Advocate Encounter   Received notification from CoverMyMeds that prior authorization for Ozempic  (2 MG/DOSE) 8MG /3ML pen-injectors is required/requested.   Insurance verification completed.   The patient is insured through [facility] .   Per test claim: Refill too soon. PA is not needed at this time. Medication was filled 09/24/2023. Next eligible fill date is 10/15/2023.

## 2023-10-11 DIAGNOSIS — E119 Type 2 diabetes mellitus without complications: Secondary | ICD-10-CM | POA: Diagnosis not present

## 2023-10-11 LAB — HM DIABETES EYE EXAM

## 2023-10-18 ENCOUNTER — Other Ambulatory Visit: Payer: Self-pay | Admitting: Internal Medicine

## 2023-10-18 DIAGNOSIS — N529 Male erectile dysfunction, unspecified: Secondary | ICD-10-CM

## 2023-10-18 MED ORDER — TADALAFIL 20 MG PO TABS: 10.0000 mg | ORAL_TABLET | ORAL | 1 refills | Status: DC | PRN

## 2023-10-19 ENCOUNTER — Other Ambulatory Visit (HOSPITAL_COMMUNITY): Payer: Self-pay

## 2023-10-25 ENCOUNTER — Telehealth: Payer: Self-pay | Admitting: Pharmacy Technician

## 2023-10-25 NOTE — Telephone Encounter (Signed)
 Pharmacy Patient Advocate Encounter  Received notification from [employer] that Prior Authorization for Tadalafil  20MG  tablets has been DENIED.  Full denial letter will be uploaded to the media tab. See denial reason below.   PA #/Case ID/Reference #: [id number]  Verified with patient's pharmacy that he purchases this medication as self pay.

## 2023-11-07 DIAGNOSIS — M542 Cervicalgia: Secondary | ICD-10-CM | POA: Diagnosis not present

## 2023-12-09 ENCOUNTER — Ambulatory Visit: Admitting: Internal Medicine

## 2023-12-20 ENCOUNTER — Encounter: Payer: Self-pay | Admitting: Internal Medicine

## 2023-12-20 NOTE — Telephone Encounter (Signed)
 In respect to fax limitations of 100 pages, ROI was released from March 2024 to current to fax [phone].

## 2023-12-22 ENCOUNTER — Encounter: Payer: Self-pay | Admitting: Internal Medicine

## 2023-12-22 ENCOUNTER — Ambulatory Visit: Admitting: Internal Medicine

## 2023-12-22 VITALS — BP 127/85 | HR 96 | Ht 73.0 in | Wt 255.2 lb

## 2023-12-22 DIAGNOSIS — I10 Essential (primary) hypertension: Secondary | ICD-10-CM | POA: Diagnosis not present

## 2023-12-22 DIAGNOSIS — G4733 Obstructive sleep apnea (adult) (pediatric): Secondary | ICD-10-CM

## 2023-12-22 DIAGNOSIS — E1169 Type 2 diabetes mellitus with other specified complication: Secondary | ICD-10-CM

## 2023-12-22 DIAGNOSIS — I471 Supraventricular tachycardia, unspecified: Secondary | ICD-10-CM | POA: Diagnosis not present

## 2023-12-22 NOTE — Patient Instructions (Signed)
 Please continue to take medications as prescribed.  Please continue to follow low carb diet and perform moderate exercise/walking at least 150 mins/week.

## 2023-12-22 NOTE — Assessment & Plan Note (Addendum)
 Lab Results  Component Value Date   HGBA1C 5.9 (H) 09/07/2023   Well controlled now, HbA1c has been up to 7.4 in 2022 Associated with HTN, OSA and HLD On Metformin  500 mg QD and Ozempic  2 mg qw Had diarrhea with Metformin  1000 mg dosing in the past, was discontinued in 2023 Advised to follow diabetic diet Check HbA1c and CMP Reviewed lipid panel Diabetic eye exam: Advised to follow up with Ophthalmology for diabetic eye exam

## 2023-12-22 NOTE — Assessment & Plan Note (Addendum)
 BP Readings from Last 1 Encounters:  09/07/23 131/76   Well-controlled with Verapamil  240 mg once daily (from [place]) and Zebeta 5 mg QD considering recent PSVT Allergic to ACEi/ARB Counseled for compliance with the medications Advised DASH diet and moderate exercise/walking, at least 150 mins/week

## 2023-12-22 NOTE — Assessment & Plan Note (Signed)
 BMI Readings from Last 3 Encounters:  12/22/23 33.67 kg/m  09/07/23 35.07 kg/m  05/10/23 36.94 kg/m   Associated with HTN, type 2 DM, HLD and OSA Diet modification and moderate exercise advised On GLP-1 agonist for type II DM - lost about 10 lbs since the last visit

## 2023-12-22 NOTE — Progress Notes (Signed)
 Established Patient Office Visit  Subjective:  Patient ID: Adam Hahn, male    DOB: [birth year]  Age: 58 y.o. MRN: [id number]  CC:  Chief Complaint  Patient presents with   Medical Management of Chronic Issues    4 month f.u    HPI Adam Hahn is a 58 y.o. male with past medical history of HTN, DM and OA who presents for f/u of his chronic medical conditions.  HTN: BP is wnl now. Takes medications regularly. Patient denies headache, dizziness, chest pain, dyspnea or palpitations.  Type II DM with HLD: His HbA1c was 5.9 in 05/25.  He has been taking metformin  500 mg QD and Ozempic  2 mg qw currently.  He also takes Crestor  for HLD.  His blood glucose ranges around 100 at home.  He currently denies any polyuria, polydipsia or polyphagia.  He has lost about 10 lbs since the last visit.  He uses CPAP regularly for OSA.  He had sleep study at [place], and was found to have episode of SVT overnight.  He had cardiac monitor and saw cardiologist at [facility], but had sinus tachycardia. He is taking verapamil  and Zebeta currently.  Cervical and and lumbar radiculopathy: He has chronic neck and back pain, radiating to UE and LE.  He has right UE numbness as well.  He has seen Dr Burnetta at [facility], had anterior cervical decompression/discectomy fusion C3-C4 in 10/24.  He had worsening of UE numbness and weakness initially, but has been improving slowly.  He is currently taking gabapentin  300 mg twice daily.  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   OSA on CPAP    S/P lateral meniscectomy of left knee 06/03/2011   Shoulder impingement, right    Type 2 diabetes mellitus (HCC)    followed by pcp   Wears glasses     Past Surgical History:   Procedure Laterality Date   ANTERIOR CERVICAL DECOMP/DISCECTOMY FUSION N/A 02/17/2023   Procedure: ANTERIOR CERVICAL DECOMPRESSION/DISCECTOMY FUSION 1 LEVEL C3-4;  Surgeon: Burnetta Aures, MD;  Location: [facility] OR;  Service: Orthopedics;  Laterality: N/A;  3 hrs 3 C-Bed   COLONOSCOPY WITH PROPOFOL  N/A 08/13/2020   Procedure: COLONOSCOPY WITH PROPOFOL ;  Surgeon: Eartha Angelia Sieving, MD;  [facility]: [facility];  Service: Gastroenterology;  Laterality: N/A;  am   KNEE SURGERY Left x4  last one 1996   LAPAROSCOPIC CHOLECYSTECTOMY  09-20-2007  @[facility]    POLYPECTOMY  08/13/2020   Procedure: POLYPECTOMY;  Surgeon: Eartha Angelia Sieving, MD;  Location: [facility];  Service: Gastroenterology;;   ROTATOR CUFF REPAIR Left 2022   SHOULDER ARTHROSCOPY WITH ROTATOR CUFF REPAIR Right 10/17/2018   Procedure: Right shoulder arthroscopy with subacromial decompression, distal clavicle resection,  rotator cuff DEBRIEDMENT, LABRIAL DEBRIDMENT;  Surgeon: Melita Drivers, MD;  Location: [facility];  Service: Orthopedics;  Laterality: Right;     Family History  Problem Relation Age of Onset   Hypertension Father    Asthma Mother    Allergic rhinitis Neg Hx    Angioedema Neg Hx    Atopy Neg Hx    Eczema Neg Hx    Immunodeficiency Neg Hx    Urticaria Neg Hx  Social History   Socioeconomic History   Marital status: Married    Spouse name: Not on file   Number of children: Not on file   Years of education: Not on file   Highest education level: Bachelor's degree (e.g., BA, AB, BS)  Occupational History   Not on file  Tobacco Use   Smoking status: Never   Smokeless tobacco: Never  Vaping Use   Vaping status: Never Used  Substance and Sexual Activity   Alcohol use: Yes    Comment: occasionally   Drug use: Never   Sexual activity: Yes  Other Topics Concern   Not on file  Social History Narrative   Not on file   Social Drivers of Health   Financial Resource  Strain: Low Risk  (12/20/2023)   Overall Financial Resource Strain (CARDIA)    Difficulty of Paying Living Expenses: Not hard at all  Food Insecurity: No Food Insecurity (12/20/2023)   Hunger Vital Sign    Worried About Running Out of Food in the Last Year: Never true    Ran Out of Food in the Last Year: Never true  Transportation Needs: No Transportation Needs (12/20/2023)   PRAPARE - Administrator, Civil Service (Medical): No    Lack of Transportation (Non-Medical): No  Physical Activity: Insufficiently Active (12/20/2023)   Exercise Vital Sign    Days of Exercise per Week: 3 days    Minutes of Exercise per Session: 30 min  Stress: No Stress Concern Present (12/20/2023)   [employer] of Occupational Health - Occupational Stress Questionnaire    Feeling of Stress: Not at all  Social Connections: Moderately Integrated (12/20/2023)   Social Connection and Isolation Panel    Frequency of Communication with Friends and Family: More than three times a week    Frequency of Social Gatherings with Friends and Family: Once a week    Attends Religious Services: More than 4 times per year    Active Member of [employer] or Organizations: No    Attends Engineer, structural: Not on file    Marital Status: Married  Intimate Partner Violence: Unknown (08/07/2021)   Received from [facility]   HITS    Physically Hurt: Not on file    Insult or Talk Down To: Not on file    Threaten Physical Harm: Not on file    Scream or Curse: Not on file    Outpatient Medications Prior to Visit  Medication Sig Dispense Refill   bisoprolol (ZEBETA) 5 MG tablet Take 5 mg by mouth daily.     celecoxib  (CELEBREX ) 200 MG capsule Take 200 mg by mouth daily.     Cholecalciferol 50 MCG (2000 UT) TABS Take 2,000 Units by mouth daily.     cholestyramine  (QUESTRAN ) 4 g packet TAKE 1 PACKET BY MOUTH 2 TIMES DAILY. 60 packet 0   gabapentin  (NEURONTIN ) 300 MG capsule Take 1 capsule (300 mg total) by mouth  2 (two) times daily. 60 capsule 3   metFORMIN  (GLUCOPHAGE -XR) 500 MG 24 hr tablet Take 1 tablet (500 mg total) by mouth daily with breakfast. 30 tablet 5   Oxycodone  HCl 10 MG TABS Take 10 mg by mouth 3 (three) times daily as needed.     OZEMPIC , 2 MG/DOSE, 8 MG/3ML SOPN INJECT 2MG  SUBCUTANEOUSLY ONCE PER WEEK 3 mL 5   rosuvastatin  (CRESTOR ) 10 MG tablet Take 10 mg by mouth daily.     tadalafil  (CIALIS ) 20 MG tablet Take 0.5-1 tablets (10-20  mg total) by mouth every other day as needed for erectile dysfunction. 30 tablet 1   verapamil  (CALAN -SR) 240 MG CR tablet Take 1 tablet (240 mg total) by mouth daily. 30 tablet 3   nebivolol  (BYSTOLIC ) 2.5 MG tablet TAKE ONE TABLET BY MOUTH EVERY DAY, 30 tablet 5   No facility-administered medications prior to visit.    Allergies  Allergen Reactions   Indomethacin  Other (See Comments)   Lotrel [Amlodipine Besy-Benazepril Hcl] Other (See Comments)    lethargic   Tomato Hives   Ace Inhibitors Rash   Hydrocodone Hives and Rash   Lisinopril Rash   Losartan Potassium Rash   Valsartan  Rash    ROS Review of Systems  Constitutional:  Negative for chills and fever.  HENT:  Negative for congestion and sore throat.   Eyes:  Negative for pain and discharge.  Respiratory:  Negative for cough and shortness of breath.   Cardiovascular:  Negative for chest pain and palpitations.  Gastrointestinal:  Negative for diarrhea, nausea and vomiting.  Endocrine: Negative for polydipsia and polyuria.  Genitourinary:  Negative for dysuria and hematuria.  Musculoskeletal:  Positive for arthralgias and neck pain. Negative for neck stiffness.  Skin:  Negative for rash.  Neurological:  Negative for dizziness, weakness and headaches.  Psychiatric/Behavioral:  Negative for agitation and behavioral problems.       Objective:    Physical Exam Vitals reviewed.  Constitutional:      General: He is not in acute distress.    Appearance: He is obese. He is not  diaphoretic.  HENT:     Head: Normocephalic and atraumatic.     Nose: Nose normal.     Mouth/Throat:     Mouth: Mucous membranes are moist.  Eyes:     General: No scleral icterus.    Extraocular Movements: Extraocular movements intact.  Neck:     Comments: Anterior neck incision scar - C/D/I Cardiovascular:     Rate and Rhythm: Normal rate and regular rhythm.     Heart sounds: Normal heart sounds. No murmur heard. Pulmonary:     Breath sounds: Normal breath sounds. No wheezing or rales.  Musculoskeletal:     Cervical back: Neck supple. Tenderness present.     Right lower leg: No edema.     Left lower leg: No edema.     Comments: Cystic structure noted over left hand  Skin:    General: Skin is warm.     Findings: No rash.  Neurological:     General: No focal deficit present.     Mental Status: He is alert and oriented to person, place, and time.     Sensory: No sensory deficit.     Motor: No weakness.  Psychiatric:        Mood and Affect: Mood normal.        Behavior: Behavior normal.     BP 127/85 (BP Location: Left Arm, Patient Position: Sitting)   Pulse 96   Ht 6' 1 (1.854 m)   Wt 255 lb 3.2 oz (115.8 kg)   SpO2 95%   BMI 33.67 kg/m  Wt Readings from Last 3 Encounters:  12/22/23 255 lb 3.2 oz (115.8 kg)  09/07/23 265 lb 12.8 oz (120.6 kg)  05/10/23 280 lb (127 kg)    Lab Results  Component Value Date   TSH 0.606 04/15/2023   Lab Results  Component Value Date   WBC 6.2 04/15/2023   HGB 16.8 04/15/2023   HCT 49.5 04/15/2023  MCV 88 04/15/2023   PLT 308 04/15/2023   Lab Results  Component Value Date   NA 143 04/15/2023   K 4.4 04/15/2023   CO2 21 04/15/2023   GLUCOSE 88 04/15/2023   BUN 10 04/15/2023   CREATININE 1.06 04/15/2023   BILITOT 0.3 04/15/2023   ALKPHOS 78 04/15/2023   AST 30 04/15/2023   ALT 49 (H) 04/15/2023   PROT 7.7 04/15/2023   ALBUMIN 4.6 04/15/2023   CALCIUM  10.4 (H) 04/15/2023   ANIONGAP 13 02/04/2023   EGFR 83 04/15/2023    Lab Results  Component Value Date   CHOL 137 03/09/2022   Lab Results  Component Value Date   HDL 56 03/09/2022   Lab Results  Component Value Date   LDLCALC 66 03/09/2022   Lab Results  Component Value Date   TRIG 74 03/09/2022   Lab Results  Component Value Date   CHOLHDL 2.4 03/09/2022   Lab Results  Component Value Date   HGBA1C 5.9 (H) 09/07/2023      Assessment & Plan:   Problem List Items Addressed This Visit       Cardiovascular and Mediastinum   HTN (hypertension)   BP Readings from Last 1 Encounters:  09/07/23 131/76   Well-controlled with Verapamil  240 mg once daily (from [place]) and Zebeta 5 mg QD considering recent PSVT Allergic to ACEi/ARB Counseled for compliance with the medications Advised DASH diet and moderate exercise/walking, at least 150 mins/week      Relevant Medications   bisoprolol (ZEBETA) 5 MG tablet   PSVT (paroxysmal supraventricular tachycardia) (HCC) - Primary   Noted on [place] study ([facility]) Had Holter monitor and see cardiologist at [facility] - on Zebeta 5 mg QD and Verapamil  240 mg QD      Relevant Medications   bisoprolol (ZEBETA) 5 MG tablet     Respiratory   Obstructive sleep apnea   Uses CPAP regularly, continues to benefit from it        Endocrine   Type 2 diabetes mellitus with other specified complication (HCC)   Lab Results  Component Value Date   HGBA1C 5.9 (H) 09/07/2023   Well controlled now, HbA1c has been up to 7.4 in 2022 Associated with HTN, OSA and HLD On Metformin  500 mg QD and Ozempic  2 mg qw Had diarrhea with Metformin  1000 mg dosing in the past, was discontinued in 2023 Advised to follow diabetic diet Check HbA1c and CMP Reviewed lipid panel Diabetic eye exam: Advised to follow up with Ophthalmology for diabetic eye exam      Relevant Orders   CMP14+EGFR   Hemoglobin A1c     Other   Morbid obesity (HCC)   BMI Readings from Last 3 Encounters:  12/22/23 33.67 kg/m  09/07/23 35.07 kg/m   05/10/23 36.94 kg/m   Associated with HTN, type 2 DM, HLD and OSA Diet modification and moderate exercise advised On GLP-1 agonist for type II DM - lost about 10 lbs since the last visit         No orders of the defined types were placed in this encounter.   Follow-up: Return in about 5 months (around 05/23/2024) for Annual physical.    Suzzane MARLA Blanch, MD

## 2023-12-22 NOTE — Assessment & Plan Note (Signed)
 Noted on sleep study ([facility]) Had Holter monitor and see cardiologist at [facility] - on Zebeta 5 mg QD and Verapamil  240 mg QD

## 2023-12-22 NOTE — Assessment & Plan Note (Signed)
Uses CPAP regularly, continues to benefit from it 

## 2023-12-23 ENCOUNTER — Ambulatory Visit: Payer: Self-pay | Admitting: Internal Medicine

## 2023-12-23 LAB — CMP14+EGFR
ALT: 25 IU/L (ref 0–44)
AST: 22 IU/L (ref 0–40)
Albumin: 4.4 g/dL (ref 3.8–4.9)
Alkaline Phosphatase: 78 IU/L (ref 44–121)
BUN/Creatinine Ratio: 11 (ref 9–20)
BUN: 12 mg/dL (ref 6–24)
Bilirubin Total: 0.3 mg/dL (ref 0.0–1.2)
CO2: 19 mmol/L — ABNORMAL LOW (ref 20–29)
Calcium: 10.2 mg/dL (ref 8.7–10.2)
Chloride: 105 mmol/L (ref 96–106)
Creatinine, Ser: 1.09 mg/dL (ref 0.76–1.27)
Globulin, Total: 2.8 g/dL (ref 1.5–4.5)
Glucose: 81 mg/dL (ref 70–99)
Potassium: 4.6 mmol/L (ref 3.5–5.2)
Sodium: 143 mmol/L (ref 134–144)
Total Protein: 7.2 g/dL (ref 6.0–8.5)
eGFR: 80 mL/min/1.73 (ref >59)

## 2023-12-23 LAB — HEMOGLOBIN A1C
Est. average glucose Bld gHb Est-mCnc: 108 mg/dL
Hgb A1c MFr Bld: 5.4 % (ref 4.8–5.6)

## 2023-12-25 ENCOUNTER — Other Ambulatory Visit: Payer: Self-pay | Admitting: Internal Medicine

## 2023-12-25 DIAGNOSIS — K529 Noninfective gastroenteritis and colitis, unspecified: Secondary | ICD-10-CM

## 2023-12-30 ENCOUNTER — Encounter: Payer: Self-pay | Admitting: Internal Medicine

## 2024-01-03 ENCOUNTER — Encounter: Payer: Self-pay | Admitting: Internal Medicine

## 2024-01-09 ENCOUNTER — Ambulatory Visit: Admitting: Internal Medicine

## 2024-01-13 ENCOUNTER — Other Ambulatory Visit: Payer: Self-pay | Admitting: Internal Medicine

## 2024-01-13 DIAGNOSIS — N529 Male erectile dysfunction, unspecified: Secondary | ICD-10-CM

## 2024-01-27 ENCOUNTER — Other Ambulatory Visit: Payer: Self-pay | Admitting: Internal Medicine

## 2024-02-29 ENCOUNTER — Other Ambulatory Visit (HOSPITAL_COMMUNITY): Payer: Self-pay

## 2024-02-29 ENCOUNTER — Telehealth: Payer: Self-pay | Admitting: Pharmacy Technician

## 2024-02-29 NOTE — Telephone Encounter (Signed)
 Pharmacy Patient Advocate Encounter   Received notification from CoverMyMeds that prior authorization for Ozempic  (2 MG/DOSE) 8MG /3ML pen-injectors is required/requested.   Insurance verification completed.   The patient is insured through [facility].   Per test claim: PA required; PA submitted to above mentioned insurance via Latent Key/confirmation #/EOC [facility] Status is pending

## 2024-02-29 NOTE — Telephone Encounter (Signed)
 Pharmacy Patient Advocate Encounter  Received notification from [facility] that Prior Authorization for Ozempic  (2 MG/DOSE) 8MG /3ML pen-injectors has been APPROVED from 02/29/2024 to 02/28/2027. Ran test claim, Copay is $24.99. This test claim was processed through [facility]- copay amounts may vary at other pharmacies due to pharmacy/plan contracts, or as the patient moves through the different stages of their insurance plan.   PA #/Case ID/Reference #: [id number]

## 2024-03-05 ENCOUNTER — Telehealth: Payer: Self-pay

## 2024-03-05 ENCOUNTER — Other Ambulatory Visit: Payer: Self-pay | Admitting: Internal Medicine

## 2024-03-05 ENCOUNTER — Encounter: Payer: Self-pay | Admitting: Internal Medicine

## 2024-03-05 DIAGNOSIS — E1169 Type 2 diabetes mellitus with other specified complication: Secondary | ICD-10-CM

## 2024-03-05 MED ORDER — OZEMPIC (2 MG/DOSE) 8 MG/3ML ~~LOC~~ SOPN: PEN_INJECTOR | SUBCUTANEOUS | 3 refills | Status: AC

## 2024-03-05 MED ORDER — OZEMPIC (2 MG/DOSE) 8 MG/3ML ~~LOC~~ SOPN: PEN_INJECTOR | SUBCUTANEOUS | 3 refills | Status: DC

## 2024-03-05 NOTE — Telephone Encounter (Signed)
 Copied from CRM [id number]. Topic: Clinical - Medication Question >> Mar 05, 2024 10:42 AM Anairis L wrote: Reason for CRM: 02/29/2024 We received authorization for  OZEMPIC , 2 MG/DOSE, 8 MG/3ML SOPN Please sne [facility]to [facility]  [address] [place] [phone]

## 2024-03-19 ENCOUNTER — Ambulatory Visit: Admitting: Internal Medicine

## 2024-03-19 ENCOUNTER — Encounter: Payer: Self-pay | Admitting: Internal Medicine

## 2024-03-19 VITALS — BP 132/88 | HR 75 | Ht 73.0 in | Wt 240.4 lb

## 2024-03-19 DIAGNOSIS — N401 Enlarged prostate with lower urinary tract symptoms: Secondary | ICD-10-CM | POA: Insufficient documentation

## 2024-03-19 DIAGNOSIS — R3912 Poor urinary stream: Secondary | ICD-10-CM

## 2024-03-19 MED ORDER — TAMSULOSIN HCL 0.4 MG PO CAPS: 0.4000 mg | ORAL_CAPSULE | Freq: Every day | ORAL | 5 refills | Status: DC

## 2024-03-19 NOTE — Progress Notes (Signed)
 Acute Office Visit  Subjective:    Patient ID: Adam Hahn, male    DOB: 04-13-1966, 58 y.o.   MRN: [id number]  Chief Complaint  Patient presents with   Weak urine stream    Pt has prostate concerns and concerns about urine flow.     HPI Patient is in today for complaint of weak urinary flow and urinary hesitancy for the last few weeks.  He has noticed postvoid dribbling as well and has to urinate again in 15-20 mins again. Denies dysuria or hematuria.  Denies nocturia currently.  His PSA was WNL in 09/25 at [facility] clinic.  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   OSA on CPAP    S/P lateral meniscectomy of left knee 06/03/2011   Shoulder impingement, right    Type 2 diabetes mellitus (HCC)    followed by pcp   Wears glasses     Past Surgical History:  Procedure Laterality Date   ANTERIOR CERVICAL DECOMP/DISCECTOMY FUSION N/A 02/17/2023   Procedure: ANTERIOR CERVICAL DECOMPRESSION/DISCECTOMY FUSION 1 LEVEL C3-4;  Surgeon: Burnetta Aures, MD;  Location: [facility] OR;  Service: Orthopedics;  Laterality: N/A;  3 hrs 3 C-Bed   COLONOSCOPY WITH PROPOFOL  N/A 08/13/2020   Procedure: COLONOSCOPY WITH PROPOFOL ;  Surgeon: Eartha Angelia Sieving, MD;  [facility];  Service: Gastroenterology;  Laterality: N/A;  am   KNEE SURGERY Left x4  last one 1996   LAPAROSCOPIC CHOLECYSTECTOMY  09-20-2007  @[facility]    POLYPECTOMY  08/13/2020   Procedure: POLYPECTOMY;  Surgeon: Eartha Angelia Sieving, MD;  [facility];  Service: Gastroenterology;;   ROTATOR CUFF REPAIR Left 2022   SHOULDER ARTHROSCOPY WITH ROTATOR CUFF REPAIR Right 10/17/2018   Procedure: Right shoulder arthroscopy with subacromial decompression, distal clavicle  resection,  rotator cuff DEBRIEDMENT, LABRIAL DEBRIDMENT;  Surgeon: Melita Drivers, MD;  Location: [facility];  Service: Orthopedics;  Laterality: Right;     Family History  Problem Relation Age of Onset   Hypertension Father    Asthma Mother    Allergic rhinitis Neg Hx    Angioedema Neg Hx    Atopy Neg Hx    Eczema Neg Hx    Immunodeficiency Neg Hx    Urticaria Neg Hx     Social History   Socioeconomic History   Marital status: Married    Spouse name: Not on file   Number of children: Not on file   Years of education: Not on file   Highest education level: Bachelor's degree (e.g., BA, AB, BS)  Occupational History   Not on file  Tobacco Use   Smoking status: Never   Smokeless tobacco: Never  Vaping Use   Vaping status: Never Used  Substance and Sexual Activity   Alcohol use: Yes    Comment: occasionally   Drug use: Never   Sexual activity: Yes  Other Topics Concern   Not on file  Social History Narrative   Not on file   Social Drivers of Health   Financial Resource Strain: Low Risk  (12/20/2023)   Overall Financial Resource Strain (CARDIA)    Difficulty of Paying Living Expenses: Not hard at all  Food Insecurity: No Food  Insecurity (12/20/2023)   Hunger Vital Sign    Worried About Running Out of Food in the Last Year: Never true    Ran Out of Food in the Last Year: Never true  Transportation Needs: No Transportation Needs (12/20/2023)   PRAPARE - Administrator, Civil Service (Medical): No    Lack of Transportation (Non-Medical): No  Physical Activity: Insufficiently Active (12/20/2023)   Exercise Vital Sign    Days of Exercise per Week: 3 days    Minutes of Exercise per Session: 30 min  Stress: No Stress Concern Present (12/20/2023)   [employer] of Occupational Health - Occupational Stress Questionnaire    Feeling of Stress: Not at all  Social Connections: Moderately Integrated (12/20/2023)   Social Connection and  Isolation Panel    Frequency of Communication with Friends and Family: More than three times a week    Frequency of Social Gatherings with Friends and Family: Once a week    Attends Religious Services: More than 4 times per year    Active Member of [employer] or Organizations: No    Attends Engineer, Structural: Not on file    Marital Status: Married  Intimate Partner Violence: Unknown (08/07/2021)   Received from [facility]   HITS    Physically Hurt: Not on file    Insult or Talk Down To: Not on file    Threaten Physical Harm: Not on file    Scream or Curse: Not on file    Outpatient Medications Prior to Visit  Medication Sig Dispense Refill   bisoprolol (ZEBETA) 5 MG tablet Take 5 mg by mouth daily.     Cholecalciferol 50 MCG (2000 UT) TABS Take 2,000 Units by mouth daily.     cholestyramine  (QUESTRAN ) 4 g packet DISSOLVE 1 PACKET AS DIRECTED AND TAKE BY MOUTH 2 TIMES DAILY. 60 packet 5   gabapentin  (NEURONTIN ) 300 MG capsule Take 1 capsule (300 mg total) by mouth 2 (two) times daily. 60 capsule 3   metFORMIN  (GLUCOPHAGE -XR) 500 MG 24 hr tablet Take 1 tablet (500 mg total) by mouth daily with breakfast. 30 tablet 5   rosuvastatin  (CRESTOR ) 10 MG tablet Take 10 mg by mouth daily.     Semaglutide , 2 MG/DOSE, (OZEMPIC , 2 MG/DOSE,) 8 MG/3ML SOPN INJECT 2MG  SUBCUTANEOUSLY ONCE PER WEEK 9 mL 3   tadalafil  (CIALIS ) 20 MG tablet Take 0.5-1 tablets (10-20 mg total) by mouth every other day as needed for erectile dysfunction. 30 tablet 1   verapamil  (CALAN -SR) 240 MG CR tablet Take 1 tablet (240 mg total) by mouth daily. 30 tablet 3   celecoxib  (CELEBREX ) 200 MG capsule Take 200 mg by mouth daily.     No facility-administered medications prior to visit.    Allergies  Allergen Reactions   Indomethacin  Other (See Comments)   Lotrel [Amlodipine Besy-Benazepril Hcl] Other (See Comments)    lethargic   Tomato Hives   Ace Inhibitors Rash   Hydrocodone Hives and Rash   Lisinopril Rash    Losartan Potassium Rash   Valsartan  Rash    Review of Systems  Constitutional:  Negative for chills and fever.  HENT:  Negative for congestion and sore throat.   Eyes:  Negative for pain and discharge.  Respiratory:  Negative for cough and shortness of breath.   Cardiovascular:  Negative for chest pain and palpitations.  Gastrointestinal:  Negative for diarrhea, nausea and vomiting.  Endocrine: Negative for polydipsia and polyuria.  Genitourinary:  Positive for difficulty  urinating. Negative for dysuria and hematuria.  Musculoskeletal:  Positive for arthralgias and neck pain. Negative for neck stiffness.  Skin:  Negative for rash.  Neurological:  Negative for dizziness, weakness and headaches.  Psychiatric/Behavioral:  Negative for agitation and behavioral problems.        Objective:    Physical Exam Vitals reviewed.  Constitutional:      General: He is not in acute distress.    Appearance: He is obese. He is not diaphoretic.  HENT:     Head: Normocephalic and atraumatic.     Nose: Nose normal.     Mouth/Throat:     Mouth: Mucous membranes are moist.  Eyes:     General: No scleral icterus.    Extraocular Movements: Extraocular movements intact.  Neck:     Comments: Anterior neck incision scar - C/D/I Cardiovascular:     Rate and Rhythm: Normal rate and regular rhythm.     Heart sounds: Normal heart sounds. No murmur heard. Pulmonary:     Breath sounds: Normal breath sounds. No wheezing or rales.  Abdominal:     Palpations: Abdomen is soft.     Tenderness: There is no abdominal tenderness.  Musculoskeletal:     Cervical back: Neck supple. Tenderness present.     Right lower leg: No edema.     Left lower leg: No edema.     Comments: Cystic structure noted over left hand  Skin:    General: Skin is warm.     Findings: No rash.  Neurological:     General: No focal deficit present.     Mental Status: He is alert and oriented to person, place, and time.      Sensory: No sensory deficit.     Motor: No weakness.  Psychiatric:        Mood and Affect: Mood normal.        Behavior: Behavior normal.     BP 132/88 (BP Location: Left Arm)   Pulse 75   Ht 6' 1 (1.854 m)   Wt 240 lb 6.4 oz (109 kg)   SpO2 100%   BMI 31.72 kg/m  Wt Readings from Last 3 Encounters:  03/19/24 240 lb 6.4 oz (109 kg)  12/22/23 255 lb 3.2 oz (115.8 kg)  09/07/23 265 lb 12.8 oz (120.6 kg)        Assessment & Plan:   Problem List Items Addressed This Visit       Genitourinary   Benign prostatic hyperplasia with weak urinary stream - Primary   Urinary hesitancy and postvoid dribbling likely due to BPH PSA WNL Trial of tamsulosin  0.4 mg QD If persistent or worsening symptoms, will refer to urology      Relevant Medications   tamsulosin  (FLOMAX ) 0.4 MG CAPS capsule     Meds ordered this encounter  Medications   tamsulosin  (FLOMAX ) 0.4 MG CAPS capsule    Sig: Take 1 capsule (0.4 mg total) by mouth daily.    Dispense:  30 capsule    Refill:  5     Fantashia Shupert MARLA Blanch, MD

## 2024-03-19 NOTE — Patient Instructions (Signed)
 Please start taking Tamsulosin  as prescribed.  Please continue to take other medications as prescribed.  Please continue to follow low carb diet and perform moderate exercise/walking at least 150 mins/week.

## 2024-03-19 NOTE — Assessment & Plan Note (Signed)
 Urinary hesitancy and postvoid dribbling likely due to BPH PSA WNL Trial of tamsulosin  0.4 mg QD If persistent or worsening symptoms, will refer to urology

## 2024-03-20 ENCOUNTER — Ambulatory Visit: Admitting: Internal Medicine

## 2024-03-21 ENCOUNTER — Ambulatory Visit: Payer: Self-pay | Admitting: Internal Medicine

## 2024-05-08 DIAGNOSIS — N401 Enlarged prostate with lower urinary tract symptoms: Secondary | ICD-10-CM

## 2024-05-17 ENCOUNTER — Ambulatory Visit: Admitting: Internal Medicine

## 2024-05-17 ENCOUNTER — Encounter: Payer: Self-pay | Admitting: Internal Medicine

## 2024-05-17 VITALS — BP 110/71 | HR 97 | Ht 73.0 in | Wt 244.8 lb

## 2024-05-17 DIAGNOSIS — E559 Vitamin D deficiency, unspecified: Secondary | ICD-10-CM

## 2024-05-17 DIAGNOSIS — E1169 Type 2 diabetes mellitus with other specified complication: Secondary | ICD-10-CM

## 2024-05-17 DIAGNOSIS — N401 Enlarged prostate with lower urinary tract symptoms: Secondary | ICD-10-CM

## 2024-05-17 DIAGNOSIS — R3912 Poor urinary stream: Secondary | ICD-10-CM | POA: Diagnosis not present

## 2024-05-17 DIAGNOSIS — G959 Disease of spinal cord, unspecified: Secondary | ICD-10-CM

## 2024-05-17 DIAGNOSIS — E782 Mixed hyperlipidemia: Secondary | ICD-10-CM | POA: Diagnosis not present

## 2024-05-17 DIAGNOSIS — Z0001 Encounter for general adult medical examination with abnormal findings: Secondary | ICD-10-CM | POA: Diagnosis not present

## 2024-05-17 DIAGNOSIS — Z23 Encounter for immunization: Secondary | ICD-10-CM

## 2024-05-17 DIAGNOSIS — I10 Essential (primary) hypertension: Secondary | ICD-10-CM

## 2024-05-17 DIAGNOSIS — Z7985 Long-term (current) use of injectable non-insulin antidiabetic drugs: Secondary | ICD-10-CM | POA: Diagnosis not present

## 2024-05-17 MED ORDER — TAMSULOSIN HCL 0.4 MG PO CAPS: 0.8000 mg | ORAL_CAPSULE | Freq: Every day | ORAL | 3 refills | Status: AC

## 2024-05-17 NOTE — Assessment & Plan Note (Addendum)
 Physical exam as documented. Counseling done  re healthy lifestyle involving commitment to 150 minutes exercise per week, heart healthy diet, and attaining healthy weight.The importance of adequate sleep also discussed. Immunization and cancer screening needs are specifically addressed at this visit.

## 2024-05-17 NOTE — Assessment & Plan Note (Signed)
 BP Readings from Last 1 Encounters:  05/17/24 110/71   Well-controlled with Verapamil  240 mg once daily (from [place]) and Zebeta 5 mg once daily (due to h/o PSVT) Allergic to ACEi/ARB Counseled for compliance with the medications Advised DASH diet and moderate exercise/walking, at least 150 mins/week

## 2024-05-17 NOTE — Progress Notes (Signed)
 "  Established Patient Office Visit  Subjective:  Patient ID: Adam Hahn, male    DOB: 05/19/1965  Age: 59 y.o. MRN: [id number]  CC:  Chief Complaint  Patient presents with   Annual Exam    Cpe     HPI Adam Hahn is a 59 y.o. male with past medical history of HTN, DM and OA who presents for f/u of his chronic medical conditions.  HTN: BP is wnl now. Takes medications regularly. Patient denies headache, dizziness, chest pain, dyspnea or palpitations.  Type II DM with HLD: His HbA1c was 5.4 in 08/25.  He has been taking metformin  500 mg QD and Ozempic  2 mg qw currently.  He also takes Crestor  for HLD.  His blood glucose ranges around 100 at home.  He currently denies any polyuria, polydipsia or polyphagia.  He has lost about 21 lbs since the visit in 09/25.  He uses CPAP regularly for OSA.  He had sleep study at [place], and was found to have episode of SVT overnight.  He had cardiac monitor and saw cardiologist at [facility], but had sinus tachycardia. He is taking verapamil  and Zebeta currently.  Cervical and and lumbar radiculopathy: He has chronic neck and back pain, radiating to UE and LE.  He has right UE numbness as well.  He has seen Dr Burnetta at [facility], had anterior cervical decompression/discectomy fusion C3-C4 in 10/24.  He had worsening of UE numbness and weakness initially, but has been improving slowly.  He is currently taking gabapentin  300 mg twice daily.  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   OSA on CPAP    S/P lateral meniscectomy of left knee 06/03/2011   Shoulder impingement, right    Type 2 diabetes mellitus (HCC)    followed by pcp   Wears glasses     Past Surgical History:  Procedure Laterality Date    ANTERIOR CERVICAL DECOMP/DISCECTOMY FUSION N/A 02/17/2023   Procedure: ANTERIOR CERVICAL DECOMPRESSION/DISCECTOMY FUSION 1 LEVEL C3-4;  Surgeon: Burnetta Aures, MD;  Location: [facility] OR;  Service: Orthopedics;  Laterality: N/A;  3 hrs 3 C-Bed   COLONOSCOPY WITH PROPOFOL  N/A 08/13/2020   Procedure: COLONOSCOPY WITH PROPOFOL ;  Surgeon: Eartha Angelia Sieving, MD;  Location: [facility];  Service: Gastroenterology;  Laterality: N/A;  am   KNEE SURGERY Left x4  last one 1996   LAPAROSCOPIC CHOLECYSTECTOMY  09-20-2007  @[facility]    POLYPECTOMY  08/13/2020   Procedure: POLYPECTOMY;  Surgeon: Eartha Angelia Sieving, MD;  Location: [facility];  Service: Gastroenterology;;   ROTATOR CUFF REPAIR Left 2022   SHOULDER ARTHROSCOPY WITH ROTATOR CUFF REPAIR Right 10/17/2018   Procedure: Right shoulder arthroscopy with subacromial decompression, distal clavicle resection,  rotator cuff DEBRIEDMENT, LABRIAL DEBRIDMENT;  Surgeon: Melita Drivers, MD;  Location: [facility] [place];  Service: Orthopedics;  Laterality: Right;     Family History  Problem Relation Age of Onset   Hypertension Father    Asthma Mother    Allergic rhinitis Neg Hx    Angioedema Neg Hx    Atopy Neg Hx    Eczema Neg Hx    Immunodeficiency Neg Hx    Urticaria Neg Hx  Social History   Socioeconomic History   Marital status: Married    Spouse name: Not on file   Number of children: Not on file   Years of education: Not on file   Highest education level: Bachelor's degree (e.g., BA, AB, BS)  Occupational History   Not on file  Tobacco Use   Smoking status: Never   Smokeless tobacco: Never  Vaping Use   Vaping status: Never Used  Substance and Sexual Activity   Alcohol use: Yes    Comment: occasionally   Drug use: Never   Sexual activity: Yes  Other Topics Concern   Not on file  Social History Narrative   Not on file   Social Drivers of Health   Tobacco Use: Low Risk (05/17/2024)   Patient  History    Smoking Tobacco Use: Never    Smokeless Tobacco Use: Never    Passive Exposure: Not on file  Financial Resource Strain: Low Risk (12/20/2023)   Overall Financial Resource Strain (CARDIA)    Difficulty of Paying Living Expenses: Not hard at all  Food Insecurity: No Food Insecurity (12/20/2023)   Epic    Worried About Programme Researcher, Broadcasting/film/video in the Last Year: Never true    Ran Out of Food in the Last Year: Never true  Transportation Needs: No Transportation Needs (12/20/2023)   Epic    Lack of Transportation (Medical): No    Lack of Transportation (Non-Medical): No  Physical Activity: Insufficiently Active (12/20/2023)   Exercise Vital Sign    Days of Exercise per Week: 3 days    Minutes of Exercise per Session: 30 min  Stress: No Stress Concern Present (12/20/2023)   [employer] of Occupational Health - Occupational Stress Questionnaire    Feeling of Stress: Not at all  Social Connections: Moderately Integrated (12/20/2023)   Social Connection and Isolation Panel    Frequency of Communication with Friends and Family: More than three times a week    Frequency of Social Gatherings with Friends and Family: Once a week    Attends Religious Services: More than 4 times per year    Active Member of [employer] or Organizations: No    Attends Engineer, Structural: Not on file    Marital Status: Married  Intimate Partner Violence: Unknown (08/07/2021)   Received from [facility]   HITS    Physically Hurt: Not on file    Insult or Talk Down To: Not on file    Threaten Physical Harm: Not on file    Scream or Curse: Not on file  Depression (PHQ2-9): Low Risk (05/17/2024)   Depression (PHQ2-9)    PHQ-2 Score: 0  Alcohol Screen: Low Risk (12/20/2023)   Alcohol Screen    Last Alcohol Screening Score (AUDIT): 2  Housing: Low Risk (12/20/2023)   Epic    Unable to Pay for Housing in the Last Year: No    Number of Times Moved in the Last Year: 0    Homeless in the Last Year: No   Utilities: Not on file  Health Literacy: Not on file    Outpatient Medications Prior to Visit  Medication Sig Dispense Refill   bisoprolol (ZEBETA) 5 MG tablet Take 5 mg by mouth daily.     Cholecalciferol 50 MCG (2000 UT) TABS Take 2,000 Units by mouth daily.     cholestyramine  (QUESTRAN ) 4 g packet DISSOLVE 1 PACKET AS DIRECTED AND TAKE BY MOUTH 2 TIMES DAILY. 60 packet 5   metFORMIN  (GLUCOPHAGE -XR)  500 MG 24 hr tablet Take 1 tablet (500 mg total) by mouth daily with breakfast. 30 tablet 5   pregabalin (LYRICA) 50 MG capsule Take 50 mg by mouth 2 (two) times daily.     rosuvastatin  (CRESTOR ) 10 MG tablet Take 10 mg by mouth daily.     Semaglutide , 2 MG/DOSE, (OZEMPIC , 2 MG/DOSE,) 8 MG/3ML SOPN INJECT 2MG  SUBCUTANEOUSLY ONCE PER WEEK 9 mL 3   tadalafil  (CIALIS ) 20 MG tablet Take 0.5-1 tablets (10-20 mg total) by mouth every other day as needed for erectile dysfunction. 30 tablet 1   verapamil  (CALAN -SR) 240 MG CR tablet Take 1 tablet (240 mg total) by mouth daily. 30 tablet 3   gabapentin  (NEURONTIN ) 300 MG capsule Take 1 capsule (300 mg total) by mouth 2 (two) times daily. 60 capsule 3   tamsulosin  (FLOMAX ) 0.4 MG CAPS capsule TAKE 1 CAPSULE BY MOUTH EVERY DAY 90 capsule 2   No facility-administered medications prior to visit.    Allergies  Allergen Reactions   Indomethacin  Other (See Comments)   Lotrel [Amlodipine Besy-Benazepril Hcl] Other (See Comments)    lethargic   Tomato Hives   Ace Inhibitors Rash   Hydrocodone Hives and Rash   Lisinopril Rash   Losartan Potassium Rash   Valsartan  Rash    ROS Review of Systems  Constitutional:  Negative for chills and fever.  HENT:  Negative for congestion and sore throat.   Eyes:  Negative for pain and discharge.  Respiratory:  Negative for cough and shortness of breath.   Cardiovascular:  Negative for chest pain and palpitations.  Gastrointestinal:  Negative for diarrhea, nausea and vomiting.  Endocrine: Negative for polydipsia  and polyuria.  Genitourinary:  Negative for dysuria and hematuria.  Musculoskeletal:  Positive for arthralgias and neck pain. Negative for neck stiffness.  Skin:  Negative for rash.  Neurological:  Negative for dizziness, weakness and headaches.  Psychiatric/Behavioral:  Negative for agitation and behavioral problems.       Objective:    Physical Exam Vitals reviewed.  Constitutional:      General: He is not in acute distress.    Appearance: He is obese. He is not diaphoretic.  HENT:     Head: Normocephalic and atraumatic.     Nose: Nose normal.     Mouth/Throat:     Mouth: Mucous membranes are moist.  Eyes:     General: No scleral icterus.    Extraocular Movements: Extraocular movements intact.  Neck:     Comments: Anterior neck incision scar - C/D/I Cardiovascular:     Rate and Rhythm: Normal rate and regular rhythm.     Heart sounds: Normal heart sounds. No murmur heard. Pulmonary:     Breath sounds: Normal breath sounds. No wheezing or rales.  Abdominal:     Palpations: Abdomen is soft.     Tenderness: There is no abdominal tenderness.  Musculoskeletal:     Cervical back: Neck supple. Tenderness present.     Right lower leg: No edema.     Left lower leg: No edema.     Comments: Cystic structure noted over left hand  Skin:    General: Skin is warm.     Findings: No rash.  Neurological:     General: No focal deficit present.     Mental Status: He is alert and oriented to person, place, and time.     Cranial Nerves: No cranial nerve deficit.     Sensory: No sensory deficit.  Motor: No weakness.  Psychiatric:        Mood and Affect: Mood normal.        Behavior: Behavior normal.     BP 110/71   Pulse 97   Ht 6' 1 (1.854 m)   Wt 244 lb 12.8 oz (111 kg)   SpO2 97%   BMI 32.30 kg/m  Wt Readings from Last 3 Encounters:  05/17/24 244 lb 12.8 oz (111 kg)  03/19/24 240 lb 6.4 oz (109 kg)  12/22/23 255 lb 3.2 oz (115.8 kg)    Lab Results  Component  Value Date   TSH 0.606 04/15/2023   Lab Results  Component Value Date   WBC 6.2 04/15/2023   HGB 16.8 04/15/2023   HCT 49.5 04/15/2023   MCV 88 04/15/2023   PLT 308 04/15/2023   Lab Results  Component Value Date   NA 143 12/22/2023   K 4.6 12/22/2023   CO2 19 (L) 12/22/2023   GLUCOSE 81 12/22/2023   BUN 12 12/22/2023   CREATININE 1.09 12/22/2023   BILITOT 0.3 12/22/2023   ALKPHOS 78 12/22/2023   AST 22 12/22/2023   ALT 25 12/22/2023   PROT 7.2 12/22/2023   ALBUMIN 4.4 12/22/2023   CALCIUM  10.2 12/22/2023   ANIONGAP 13 02/04/2023   EGFR 80 12/22/2023   Lab Results  Component Value Date   CHOL 137 03/09/2022   Lab Results  Component Value Date   HDL 56 03/09/2022   Lab Results  Component Value Date   LDLCALC 66 03/09/2022   Lab Results  Component Value Date   TRIG 74 03/09/2022   Lab Results  Component Value Date   CHOLHDL 2.4 03/09/2022   Lab Results  Component Value Date   HGBA1C 5.4 12/22/2023      Assessment & Plan:   Problem List Items Addressed This Visit       Cardiovascular and Mediastinum   HTN (hypertension)   BP Readings from Last 1 Encounters:  05/17/24 110/71   Well-controlled with Verapamil  240 mg once daily (from [facility]) and Zebeta 5 mg once daily (due to h/o PSVT) Allergic to ACEi/ARB Counseled for compliance with the medications Advised DASH diet and moderate exercise/walking, at least 150 mins/week      Relevant Orders   TSH   CMP14+EGFR   CBC with Differential/Platelet     Endocrine   Type 2 diabetes mellitus with other specified complication (HCC)   Lab Results  Component Value Date   HGBA1C 5.4 12/22/2023   Well controlled now, HbA1c has been up to 7.4 in 2022 Associated with HTN, OSA and HLD On Metformin  500 mg QD and Ozempic  2 mg qw Had diarrhea with Metformin  1000 mg dosing in the past, dose was reduced in 2023 Advised to follow diabetic diet Check HbA1c and CMP Reviewed lipid panel Diabetic eye exam: Advised  to follow up with Ophthalmology for diabetic eye exam      Relevant Orders   Hemoglobin A1c   CMP14+EGFR     Nervous and Auditory   Cervical myelopathy (HCC)   S/p cervical fusion - C3-4 surgery (10/24) Followed by Spine surgery Continue gabapentin  to 300 mg BID        Genitourinary   Benign prostatic hyperplasia with weak urinary stream   Urinary hesitancy and postvoid dribbling likely due to BPH PSA WNL Some improvement with tamsulosin  0.4 mg QD, but still has nocturia - he has felt better with 2 capsules at nighttime Increased dose of tamsulosin   to 0.8 mg QD If persistent or worsening symptoms, will refer to urology      Relevant Medications   tamsulosin  (FLOMAX ) 0.4 MG CAPS capsule     Other   Vitamin D  deficiency   Relevant Orders   VITAMIN D  25 Hydroxy (Vit-D Deficiency, Fractures)   Encounter for general adult medical examination with abnormal findings - Primary   Physical exam as documented. Counseling done  re healthy lifestyle involving commitment to 150 minutes exercise per week, heart healthy diet, and attaining healthy weight.The importance of adequate sleep also discussed. Immunization and cancer screening needs are specifically addressed at this visit.      Hyperlipidemia   On Crestor  Check lipid profile      Relevant Orders   Lipid panel   Other Visit Diagnoses       Encounter for immunization       Relevant Orders   Flu vaccine trivalent PF, 6mos and older(Flulaval,Afluria,Fluarix,Fluzone) (Completed)         Meds ordered this encounter  Medications   tamsulosin  (FLOMAX ) 0.4 MG CAPS capsule    Sig: Take 2 capsules (0.8 mg total) by mouth daily.    Dispense:  180 capsule    Refill:  3    Follow-up: Return in about 4 months (around 09/14/2024) for DM.    Suzzane MARLA Blanch, MD "

## 2024-05-17 NOTE — Assessment & Plan Note (Signed)
 Lab Results  Component Value Date   HGBA1C 5.4 12/22/2023   Well controlled now, HbA1c has been up to 7.4 in 2022 Associated with HTN, OSA and HLD On Metformin  500 mg QD and Ozempic  2 mg qw Had diarrhea with Metformin  1000 mg dosing in the past, dose was reduced in 2023 Advised to follow diabetic diet Check HbA1c and CMP Reviewed lipid panel Diabetic eye exam: Advised to follow up with Ophthalmology for diabetic eye exam

## 2024-05-17 NOTE — Patient Instructions (Signed)
 Please start taking Tamsulosin  2 capsules at nighttime.  Please continue to take other medications as prescribed.  Please continue to follow low carb diet and perform moderate exercise/walking at least 150 mins/week.

## 2024-05-17 NOTE — Assessment & Plan Note (Signed)
On Crestor Check lipid profile 

## 2024-05-17 NOTE — Assessment & Plan Note (Signed)
 S/p cervical fusion - C3-4 surgery (10/24) Followed by Spine surgery Continue gabapentin  to 300 mg BID

## 2024-05-17 NOTE — Assessment & Plan Note (Signed)
 Urinary hesitancy and postvoid dribbling likely due to BPH PSA WNL Some improvement with tamsulosin  0.4 mg QD, but still has nocturia - he has felt better with 2 capsules at nighttime Increased dose of tamsulosin  to 0.8 mg QD If persistent or worsening symptoms, will refer to urology

## 2024-05-18 LAB — CMP14+EGFR
ALT: 13 IU/L (ref 0–44)
AST: 16 IU/L (ref 0–40)
Albumin: 4.6 g/dL (ref 3.8–4.9)
Alkaline Phosphatase: 85 IU/L (ref 47–123)
BUN/Creatinine Ratio: 9 (ref 9–20)
BUN: 10 mg/dL (ref 6–24)
Bilirubin Total: 0.3 mg/dL (ref 0.0–1.2)
CO2: 22 mmol/L (ref 20–29)
Calcium: 10.1 mg/dL (ref 8.7–10.2)
Chloride: 101 mmol/L (ref 96–106)
Creatinine, Ser: 1.1 mg/dL (ref 0.76–1.27)
Globulin, Total: 2.8 g/dL (ref 1.5–4.5)
Glucose: 111 mg/dL — ABNORMAL HIGH (ref 70–99)
Potassium: 4.7 mmol/L (ref 3.5–5.2)
Sodium: 139 mmol/L (ref 134–144)
Total Protein: 7.4 g/dL (ref 6.0–8.5)
eGFR: 79 mL/min/1.73

## 2024-05-18 LAB — CBC WITH DIFFERENTIAL/PLATELET
Basophils Absolute: 0 x10E3/uL (ref 0.0–0.2)
Basos: 0 %
EOS (ABSOLUTE): 0 x10E3/uL (ref 0.0–0.4)
Eos: 0 %
Hematocrit: 47.6 % (ref 37.5–51.0)
Hemoglobin: 15.8 g/dL (ref 13.0–17.7)
Immature Grans (Abs): 0.1 x10E3/uL (ref 0.0–0.1)
Immature Granulocytes: 1 %
Lymphocytes Absolute: 1.2 x10E3/uL (ref 0.7–3.1)
Lymphs: 8 %
MCH: 29.9 pg (ref 26.6–33.0)
MCHC: 33.2 g/dL (ref 31.5–35.7)
MCV: 90 fL (ref 79–97)
Monocytes Absolute: 0.6 x10E3/uL (ref 0.1–0.9)
Monocytes: 4 %
Neutrophils Absolute: 13.5 x10E3/uL — ABNORMAL HIGH (ref 1.4–7.0)
Neutrophils: 87 %
Platelets: 283 x10E3/uL (ref 150–450)
RBC: 5.29 x10E6/uL (ref 4.14–5.80)
RDW: 12.6 % (ref 11.6–15.4)
WBC: 15.5 x10E3/uL — ABNORMAL HIGH (ref 3.4–10.8)

## 2024-05-18 LAB — HEMOGLOBIN A1C
Est. average glucose Bld gHb Est-mCnc: 103 mg/dL
Hgb A1c MFr Bld: 5.2 % (ref 4.8–5.6)

## 2024-05-18 LAB — LIPID PANEL
Chol/HDL Ratio: 2.4 ratio (ref 0.0–5.0)
Cholesterol, Total: 167 mg/dL (ref 100–199)
HDL: 70 mg/dL
LDL Chol Calc (NIH): 79 mg/dL (ref 0–99)
Triglycerides: 97 mg/dL (ref 0–149)
VLDL Cholesterol Cal: 18 mg/dL (ref 5–40)

## 2024-05-18 LAB — VITAMIN D 25 HYDROXY (VIT D DEFICIENCY, FRACTURES): Vit D, 25-Hydroxy: 37.5 ng/mL (ref 30.0–100.0)

## 2024-05-18 LAB — TSH: TSH: 0.133 u[IU]/mL — ABNORMAL LOW (ref 0.450–4.500)

## 2024-05-21 ENCOUNTER — Encounter: Payer: Self-pay | Admitting: Internal Medicine

## 2024-05-21 ENCOUNTER — Ambulatory Visit: Payer: Self-pay | Admitting: Internal Medicine

## 2024-05-22 ENCOUNTER — Other Ambulatory Visit: Payer: Self-pay | Admitting: Internal Medicine

## 2024-05-22 DIAGNOSIS — E291 Testicular hypofunction: Secondary | ICD-10-CM

## 2024-05-23 ENCOUNTER — Ambulatory Visit: Admitting: Internal Medicine

## 2024-05-24 LAB — SPECIMEN STATUS REPORT

## 2024-05-24 LAB — T4, FREE: Free T4: 1.24 ng/dL (ref 0.82–1.77)

## 2024-06-01 ENCOUNTER — Other Ambulatory Visit: Payer: Self-pay | Admitting: Internal Medicine

## 2024-06-01 DIAGNOSIS — E0590 Thyrotoxicosis, unspecified without thyrotoxic crisis or storm: Secondary | ICD-10-CM | POA: Insufficient documentation

## 2024-07-04 ENCOUNTER — Ambulatory Visit: Admitting: Internal Medicine

## 2024-07-11 ENCOUNTER — Ambulatory Visit: Admitting: Internal Medicine

## 2024-09-21 ENCOUNTER — Ambulatory Visit: Payer: Self-pay | Admitting: Internal Medicine
